# Patient Record
Sex: Male | Born: 1965 | Race: Black or African American | Hispanic: No | Marital: Single | Smoking: Current every day smoker
Health system: Southern US, Community
[De-identification: ages and names within clinical notes are randomized; demographics above are authoritative.]

## PROBLEM LIST (undated history)

## (undated) DIAGNOSIS — R600 Localized edema: Secondary | ICD-10-CM

## (undated) HISTORY — PX: DEBRIDEMENT OF ABDOMINAL WALL ABSCESS: SHX6396

---

## 2013-12-13 ENCOUNTER — Emergency Department: Payer: Self-pay | Admitting: Emergency Medicine

## 2015-11-24 ENCOUNTER — Encounter: Payer: Self-pay | Admitting: Emergency Medicine

## 2015-11-24 ENCOUNTER — Emergency Department: Payer: Self-pay

## 2015-11-24 ENCOUNTER — Emergency Department
Admission: EM | Admit: 2015-11-24 | Discharge: 2015-11-24 | Disposition: A | Discharge status: Home / Self Care | Payer: Self-pay | Attending: Emergency Medicine | Admitting: Emergency Medicine

## 2015-11-24 DIAGNOSIS — Y939 Activity, unspecified: Secondary | ICD-10-CM | POA: Insufficient documentation

## 2015-11-24 DIAGNOSIS — Y999 Unspecified external cause status: Secondary | ICD-10-CM | POA: Insufficient documentation

## 2015-11-24 DIAGNOSIS — W540XXA Bitten by dog, initial encounter: Secondary | ICD-10-CM | POA: Insufficient documentation

## 2015-11-24 DIAGNOSIS — F17210 Nicotine dependence, cigarettes, uncomplicated: Secondary | ICD-10-CM | POA: Insufficient documentation

## 2015-11-24 DIAGNOSIS — S81852A Open bite, left lower leg, initial encounter: Secondary | ICD-10-CM | POA: Insufficient documentation

## 2015-11-24 DIAGNOSIS — Y929 Unspecified place or not applicable: Secondary | ICD-10-CM | POA: Insufficient documentation

## 2015-11-24 LAB — COMPREHENSIVE METABOLIC PANEL
ALBUMIN: 4 g/dL (ref 3.5–5.0)
ALT: 33 U/L (ref 17–63)
ANION GAP: 6 (ref 5–15)
AST: 31 U/L (ref 15–41)
Alkaline Phosphatase: 63 U/L (ref 38–126)
BUN: 18 mg/dL (ref 6–20)
CHLORIDE: 106 mmol/L (ref 101–111)
CO2: 27 mmol/L (ref 22–32)
Calcium: 9.1 mg/dL (ref 8.9–10.3)
Creatinine, Ser: 0.9 mg/dL (ref 0.61–1.24)
GFR calc Af Amer: >60 mL/min (ref >60)
GLUCOSE: 99 mg/dL (ref 65–99)
POTASSIUM: 3.8 mmol/L (ref 3.5–5.1)
Sodium: 139 mmol/L (ref 135–145)
Total Bilirubin: 0.6 mg/dL (ref 0.3–1.2)
Total Protein: 7.6 g/dL (ref 6.5–8.1)

## 2015-11-24 LAB — CBC WITH DIFFERENTIAL/PLATELET
Basophils Absolute: 0 10*3/uL (ref 0–0.1)
Basophils Relative: 1 %
EOS ABS: 0.1 10*3/uL (ref 0–0.7)
EOS PCT: 2 %
HCT: 41.7 % (ref 40.0–52.0)
Hemoglobin: 13.7 g/dL (ref 13.0–18.0)
LYMPHS ABS: 0.9 10*3/uL — AB (ref 1.0–3.6)
LYMPHS PCT: 19 %
MCH: 23.4 pg — ABNORMAL LOW (ref 26.0–34.0)
MCHC: 32.9 g/dL (ref 32.0–36.0)
MCV: 71.3 fL — ABNORMAL LOW (ref 80.0–100.0)
MONO ABS: 0.5 10*3/uL (ref 0.2–1.0)
Monocytes Relative: 11 %
Neutro Abs: 3.2 10*3/uL (ref 1.4–6.5)
Neutrophils Relative %: 67 %
PLATELETS: 135 10*3/uL — AB (ref 150–440)
RBC: 5.85 MIL/uL (ref 4.40–5.90)
RDW: 16.2 % — AB (ref 11.5–14.5)
WBC: 4.7 10*3/uL (ref 3.8–10.6)

## 2015-11-24 MED ORDER — AMOXICILLIN-POT CLAVULANATE 875-125 MG PO TABS: 1.0000 | ORAL_TABLET | Freq: Two times a day (BID) | ORAL | 0 refills | Status: AC

## 2015-11-24 NOTE — ED Provider Notes (Signed)
[facility] Emergency Department Provider Note   ____________________________________________    I have reviewed the triage vital signs and the nursing notes.   HISTORY  Chief Complaint Animal Bite     HPI Alfred [name] is a 50 y.o. male who presents with complaints of bleeding from his ankle.Patient reports he was bit by a dog on his medial distal left lower leg 2 weeks ago. He reports today" the bubble popped" and there was purulent discharge and then a period of bleeding. He reports the bleeding has stopped without intervention. He reports mild pain around the area but no spreading redness. No fevers or chills. He has not taken anything for the dog bite. He was not seen for the dog bite.   History reviewed. No pertinent past medical history.  There are no active problems to display for this patient.   History reviewed. No pertinent surgical history.  Prior to Admission medications   Medication Sig Start Date End Date Taking? Authorizing Provider  amoxicillin-clavulanate (AUGMENTIN) 875-125 MG tablet Take 1 tablet by mouth 2 (two) times daily. 11/24/15 12/01/15  Jene Everyobert Renisha Cockrum, MD     Allergies Review of patient's allergies indicates no known allergies.  History reviewed. No pertinent family history.  Social History Social History  Substance Use Topics  . Smoking status: Current Every Day Smoker    Packs/day: 0.50    Types: Cigarettes  . Smokeless tobacco: Never Used  . Alcohol use Yes     Comment: 3x a week    Review of Systems  Constitutional: No fever/chills   Musculoskeletal: Mild left ankle pain Skin: Negative for rash. Neurological: Negative for r weakness  10-point ROS otherwise negative.  ____________________________________________   PHYSICAL EXAM:  VITAL SIGNS: ED Triage Vitals  Enc Vitals Group     BP 11/24/15 1240 (!) 151/68     Pulse Rate 11/24/15 1240 79     Resp 11/24/15 1240 16     Temp 11/24/15  1240 98.6 F (37 C)     Temp src --      SpO2 11/24/15 1240 96 %     Weight 11/24/15 1241 300 lb (136.1 kg)     Height 11/24/15 1241 6\' 7"  (2.007 m)     Head Circumference --      Peak Flow --      Pain Score 11/24/15 1242 0     Pain Loc --      Pain Edu? --      Excl. in GC? --     Constitutional: Alert and oriented. No acute distress. Pleasant and interactive Eyes: Conjunctivae are normal.  Head: Atraumatic. Nose: No congestion/rhinnorhea. Mouth/Throat: Mucous membranes are moist.    Cardiovascular: Normal rate, regular rhythm.  Respiratory: Normal respiratory effort.  No retractions.   Genitourinary: deferred Musculoskeletal: Warm and well perfused. Just proximal to left medial malleolus there is a ulceration, mild tenderness surrounding but no spreading erythema. No discharge upon palpation. No underlying fluctuance. Neurologic:  Normal speech and language. No gross focal neurologic deficits are appreciated.  Skin:  Skin is warm, dry. Psychiatric: Mood and affect are normal. Speech and behavior are normal.  ____________________________________________   LABS (all labs ordered are listed, but only abnormal results are displayed)  Labs Reviewed  CBC WITH DIFFERENTIAL/PLATELET - Abnormal; Notable for the following:       Result Value   MCV 71.3 (*)    MCH 23.4 (*)    RDW 16.2 (*)  Platelets 135 (*)    Lymphs Abs 0.9 (*)    All other components within normal limits  COMPREHENSIVE METABOLIC PANEL   ____________________________________________  EKG  None ____________________________________________  RADIOLOGY  X-rays unremarkable ____________________________________________   PROCEDURES  Procedure(s) performed: No    Critical Care performed:No ____________________________________________   INITIAL IMPRESSION / ASSESSMENT AND PLAN / ED COURSE  Pertinent labs & imaging results that were available during my care of the patient were reviewed by me and  considered in my medical decision making (see chart for details).  Appears patient developed an abscess from untreated dog bite which ruptured today. No ongoing bleeding, no discharge currently.X-ray and labwork reassuring. Vitals normal. We will start the patient on Augmentin and have him follow-up with PCP  Clinical Course   ____________________________________________   FINAL CLINICAL IMPRESSION(S) / ED DIAGNOSES  Final diagnoses:  Dog bite      NEW MEDICATIONS STARTED DURING THIS VISIT:  New Prescriptions   AMOXICILLIN-CLAVULANATE (AUGMENTIN) 875-125 MG TABLET    Take 1 tablet by mouth 2 (two) times daily.     Note:  This document was prepared using Dragon voice recognition software and may include unintentional dictation errors.    Jene Everyobert Peggyann Zwiefelhofer, MD 11/24/15 1534

## 2015-11-24 NOTE — ED Triage Notes (Signed)
States was bit by his neighbors dog two weeks ago and his work boots are rubbing. Had him take his bloody shoe off. Pt has two areas on his foot. The one he says is from dried skin. The puncture area has the same scaly dried skin around it.

## 2016-10-03 ENCOUNTER — Emergency Department
Admission: EM | Admit: 2016-10-03 | Discharge: 2016-10-04 | Disposition: A | Discharge status: Home / Self Care | Payer: Self-pay | Attending: Emergency Medicine | Admitting: Emergency Medicine

## 2016-10-03 ENCOUNTER — Emergency Department: Payer: Self-pay

## 2016-10-03 DIAGNOSIS — F17210 Nicotine dependence, cigarettes, uncomplicated: Secondary | ICD-10-CM | POA: Insufficient documentation

## 2016-10-03 DIAGNOSIS — W458XXD Other foreign body or object entering through skin, subsequent encounter: Secondary | ICD-10-CM | POA: Insufficient documentation

## 2016-10-03 DIAGNOSIS — Y939 Activity, unspecified: Secondary | ICD-10-CM | POA: Insufficient documentation

## 2016-10-03 DIAGNOSIS — Y999 Unspecified external cause status: Secondary | ICD-10-CM | POA: Insufficient documentation

## 2016-10-03 DIAGNOSIS — Y929 Unspecified place or not applicable: Secondary | ICD-10-CM | POA: Insufficient documentation

## 2016-10-03 DIAGNOSIS — S81809A Unspecified open wound, unspecified lower leg, initial encounter: Secondary | ICD-10-CM | POA: Insufficient documentation

## 2016-10-03 LAB — CBC WITH DIFFERENTIAL/PLATELET
BASOS ABS: 0 10*3/uL (ref 0–0.1)
BASOS PCT: 1 %
Eosinophils Absolute: 0.1 10*3/uL (ref 0–0.7)
Eosinophils Relative: 2 %
HEMATOCRIT: 38 % — AB (ref 40.0–52.0)
HEMOGLOBIN: 12.2 g/dL — AB (ref 13.0–18.0)
LYMPHS PCT: 23 %
Lymphs Abs: 1 10*3/uL (ref 1.0–3.6)
MCH: 23 pg — ABNORMAL LOW (ref 26.0–34.0)
MCHC: 32.2 g/dL (ref 32.0–36.0)
MCV: 71.5 fL — AB (ref 80.0–100.0)
MONO ABS: 0.6 10*3/uL (ref 0.2–1.0)
Monocytes Relative: 14 %
NEUTROS ABS: 2.6 10*3/uL (ref 1.4–6.5)
NEUTROS PCT: 60 %
Platelets: 151 10*3/uL (ref 150–440)
RBC: 5.32 MIL/uL (ref 4.40–5.90)
RDW: 16.1 % — AB (ref 11.5–14.5)
WBC: 4.3 10*3/uL (ref 3.8–10.6)

## 2016-10-03 LAB — COMPREHENSIVE METABOLIC PANEL
ALK PHOS: 58 U/L (ref 38–126)
ALT: 32 U/L (ref 17–63)
ANION GAP: 6 (ref 5–15)
AST: 31 U/L (ref 15–41)
Albumin: 3.7 g/dL (ref 3.5–5.0)
BILIRUBIN TOTAL: 0.7 mg/dL (ref 0.3–1.2)
BUN: 20 mg/dL (ref 6–20)
CALCIUM: 9.1 mg/dL (ref 8.9–10.3)
CO2: 27 mmol/L (ref 22–32)
Chloride: 102 mmol/L (ref 101–111)
Creatinine, Ser: 0.77 mg/dL (ref 0.61–1.24)
GFR calc non Af Amer: >60 mL/min (ref >60)
GLUCOSE: 93 mg/dL (ref 65–99)
POTASSIUM: 4.2 mmol/L (ref 3.5–5.1)
Sodium: 135 mmol/L (ref 135–145)
TOTAL PROTEIN: 7.4 g/dL (ref 6.5–8.1)

## 2016-10-03 NOTE — ED Triage Notes (Signed)
Pt states has had a left ankle wound for over one year that will not heal. Pt with open wound with yellow drainage noted to left medial ankle. Pt denies fever. Pt states "i guess it won't heal because of my drinking". Pt is ambulatory with pain to ankle. Pt denies history of DM or HTN.

## 2016-10-04 NOTE — ED Notes (Signed)
Patient presents with complaint of wound to left ankle for approximately 1 year.  Patient reports occurred with a rusty nail.  Patient states area has never healed and occasionally has yellowish color drainage.  States was seen in ED once after originally occurred but none since then.  Area noted to left lateral ankle with open area with pink colored skin noted in opening, no exudate noted.

## 2016-10-04 NOTE — ED Provider Notes (Signed)
[facility] Emergency Department Provider Note  ____________________________________________   First MD Initiated Contact with Patient 10/04/16 0013     (approximate)  I have reviewed the triage vital signs and the nursing notes.   HISTORY  Chief Complaint Wound Infection    HPI Dayvin Junior Navarro is a 51 y.o. male who reports a history of regular alcohol use and tobacco use who presents for evaluation of a nonhealing wound to his left inner ankle.  He reports that he got the wound 10 months ago and came to the emergency department and was treated with antibiotics.  He states that it was due to a "rusty nail" but a review of the medical record demonstrates that he told the doctor at the time that he was bitten by a dog.  He was treated with Augmentin and a Tdap and he has not followed up with anyone.  When his brother came to visit he saw the ankle and insisted that he come to the emergency department.  He states that he has moderate pain with ambulation and weightbearing but that has been present for months.  Does not seem to be getting any worse but it also does not seem to be getting any better.  It occasionally uses some yellowish liquid.  He denies fever/chills, chest pain, shortness of breath, nausea, vomiting, abdominal pain.  He describes it as severe but constant for months.     No past medical history on file.  There are no active problems to display for this patient.   No past surgical history on file.  Prior to Admission medications   Not on File    Allergies Patient has no known allergies.  No family history on file.  Social History Social History  Substance Use Topics  . Smoking status: Current Every Day Smoker    Packs/day: 0.50    Types: Cigarettes  . Smokeless tobacco: Never Used  . Alcohol use Yes     Comment: 3x a week    Review of Systems Constitutional: No fever/chills Eyes: No visual changes. ENT: No sore  throat. Cardiovascular: Denies chest pain. Respiratory: Denies shortness of breath. Gastrointestinal: No abdominal pain.  No nausea, no vomiting.  No diarrhea.  No constipation. Genitourinary: Negative for dysuria. Musculoskeletal: Negative for neck pain.  Negative for back pain. Integumentary: Nonhealing wound to left inner ankle or 10 months Neurological: Negative for headaches, focal weakness or numbness.   ____________________________________________   PHYSICAL EXAM:  VITAL SIGNS: ED Triage Vitals [10/03/16 1955]  Enc Vitals Group     BP (!) 144/98     Pulse Rate 90     Resp 16     Temp 98.3 F (36.8 C)     Temp Source Oral     SpO2 100 %     Weight 136.1 kg (300 lb)     Height 2.007 m (6\' 7" )     Head Circumference      Peak Flow      Pain Score      Pain Loc      Pain Edu?      Excl. in GC?     Constitutional: Alert and oriented. Well appearing and in no acute distress. Eyes: Conjunctivae are normal.  Cardiovascular: Normal rate, regular rhythm. Good peripheral circulation. Grossly normal heart sounds. Respiratory: Normal respiratory effort.  No retractions. Lungs CTAB. Gastrointestinal: Soft and nontender. No distention.  Musculoskeletal: No lower extremity tenderness nor edema. No gross deformities of extremities.  Neurologic:  Normal speech and language. No gross focal neurologic deficits are appreciated.  Skin:  Multi-centimeters open wound with granulation tissue and thickened surrounding skin.  No surrounding erythema or evidence of cellulitis.  Soft and easily palpable compartments.  Minimal tenderness to palpation.  No discharge/purulence.  Took photo and placed directly into [facility] (look under Media tab of Chart Review). Psychiatric: Mood and affect are normal. Speech and behavior are normal.  ____________________________________________   LABS (all labs ordered are listed, but only abnormal results are displayed)  Labs Reviewed  CBC WITH  DIFFERENTIAL/PLATELET - Abnormal; Notable for the following:       Result Value   Hemoglobin 12.2 (*)    HCT 38.0 (*)    MCV 71.5 (*)    MCH 23.0 (*)    RDW 16.1 (*)    All other components within normal limits  COMPREHENSIVE METABOLIC PANEL   ____________________________________________  EKG  None - EKG not ordered by ED physician ____________________________________________  RADIOLOGY   Dg Ankle Complete Left  Result Date: 10/03/2016 CLINICAL DATA:  Medial left ankle wound for 1 year. EXAM: LEFT ANKLE COMPLETE - 3+ VIEW COMPARISON:  None. FINDINGS: The skin defect medially is consistent with history. No underlying bony erosion or evidence of osteomyelitis. No fracture dislocation. No other acute abnormalities. IMPRESSION: No evidence of osteomyelitis. Electronically Signed   By: Gerome Samavid  Williams III M.D   On: 10/03/2016 20:36    ____________________________________________   PROCEDURES  Critical Care performed: No   Procedure(s) performed:   Procedures   ____________________________________________   INITIAL IMPRESSION / ASSESSMENT AND PLAN / ED COURSE  Pertinent labs & imaging results that were available during my care of the patient were reviewed by me and considered in my medical decision making (see chart for details).  The patient is well-appearing and in no acute distress, afebrile, not tachycardic, normal labs with no leukocytosis, and no evidence of osteomyelitis on radiographs.  I explained that there is no evidence that he has an acute infection, counseled him to cut back on the smoking and drinking, and encouraged him to follow up with wound care center.  I took a [facility] which is available in the Media tab of Chart Review.  I gave some general wound care instructions and ordered a wound care consult to help follow-up as an outpatient.  I gave my usual and customary return precautions.  He is comfortable with the plan.       ____________________________________________  FINAL CLINICAL IMPRESSION(S) / ED DIAGNOSES  Final diagnoses:  Non-healing wound of lower extremity, initial encounter     MEDICATIONS GIVEN DURING THIS VISIT:  Medications - No data to display   NEW OUTPATIENT MEDICATIONS STARTED DURING THIS VISIT:  New Prescriptions   No medications on file    Modified Medications   No medications on file    Discontinued Medications   No medications on file     Note:  This document was prepared using Dragon voice recognition software and may include unintentional dictation errors.    Loleta RoseForbach, Izzac Rockett, MD 10/04/16 [phone]

## 2016-10-04 NOTE — Discharge Instructions (Signed)
As we discussed, although your wound has been open for about 10 months, you have no sign of active infection, with normal labs and normal x-rays with no sign of bone infection.  We do not feel you would benefit from antibiotics at this time but we do recommend that you follow up with the wound care center for further management recommendations.  Please remember that regular alcohol use and especially tobacco use can delay wound healing, so try to minimize both drinking and smoking as much as possible.  Keep the wound clean and dry to the best of your ability.

## 2016-10-30 ENCOUNTER — Encounter: Payer: Self-pay | Attending: Surgery | Admitting: Surgery

## 2016-10-30 DIAGNOSIS — F17218 Nicotine dependence, cigarettes, with other nicotine-induced disorders: Secondary | ICD-10-CM | POA: Insufficient documentation

## 2016-10-30 DIAGNOSIS — L97322 Non-pressure chronic ulcer of left ankle with fat layer exposed: Secondary | ICD-10-CM | POA: Insufficient documentation

## 2016-10-30 DIAGNOSIS — I739 Peripheral vascular disease, unspecified: Secondary | ICD-10-CM | POA: Insufficient documentation

## 2016-11-01 NOTE — Progress Notes (Signed)
VERTIS, SCHEIB ([id number]) Visit Report for 10/30/2016 Allergy List Details Patient Name: Alfred [name], Alfred [name]. Date of Service: 10/30/2016 8:00 AM Medical Record Number: [id number] Patient Account Number: [id number] Date of Birth/Sex: June 27, 1965 (51 y.o. Male) Treating RN: Curtis Sites Primary Care Amad Mau: PATIENT, NO Other Clinician: Referring Alicia Seib: Loleta Rose Treating Dorine Duffey/Extender: Rudene Re in Treatment: 0 Allergies Active Allergies No Known Allergies Allergy Notes Electronic Signature(s) Signed: 10/30/2016 4:26:29 PM By: Curtis Sites Entered By: Curtis Sites on 10/30/2016 08:21:28 Alfred Han ([id number]) -------------------------------------------------------------------------------- Arrival Information Details Patient Name: Alfred [name], Alfred [name]. Date of Service: 10/30/2016 8:00 AM Medical Record Number: [id number] Patient Account Number: [id number] Date of Birth/Sex: [birth year] (51 y.o. Male) Treating RN: Curtis Sites Primary Care Roshawna Colclasure: PATIENT, NO Other Clinician: Referring Shadi Sessler: Loleta Rose Treating Sarely Stracener/Extender: Rudene Re in Treatment: 0 Visit Information Patient Arrived: Ambulatory Arrival Time: 08:17 Accompanied By: self Transfer Assistance: None Patient Identification Verified: Yes Secondary Verification Process Yes Completed: Patient Has Alerts: Yes Patient Alerts: ABI [place] >220 Electronic Signature(s) Signed: 10/30/2016 4:26:29 PM By: Curtis Sites Entered By: Curtis Sites on 10/30/2016 08:53:40 Alfred Han ([id number]) -------------------------------------------------------------------------------- Clinic Level of Care Assessment Details Patient Name: Alfred [name], Alfred [name]. Date of Service: 10/30/2016 8:00 AM Medical Record Number: [id number] Patient Account Number: [id number] Date of Birth/Sex: 1965-04-25 (51 y.o. Male) Treating RN: Curtis Sites Primary Care Wyllow Seigler: PATIENT, NO Other  Clinician: Referring Luzmaria Devaux: Loleta Rose Treating Ewald Beg/Extender: Rudene Re in Treatment: 0 Clinic Level of Care Assessment Items TOOL 1 Quantity Score []  - Use when EandM and Procedure is performed on INITIAL visit 0 ASSESSMENTS - Nursing Assessment / Reassessment X - General Physical Exam (combine w/ comprehensive assessment (listed just 1 20 below) when performed on new pt. evals) X - Comprehensive Assessment (HX, ROS, Risk Assessments, Wounds Hx, etc.) 1 25 ASSESSMENTS - Wound and Skin Assessment / Reassessment []  - Dermatologic / Skin Assessment (not related to wound area) 0 ASSESSMENTS - Ostomy and/or Continence Assessment and Care []  - Incontinence Assessment and Management 0 []  - Ostomy Care Assessment and Management (repouching, etc.) 0 PROCESS - Coordination of Care X - Simple Patient / Family Education for ongoing care 1 15 []  - Complex (extensive) Patient / Family Education for ongoing care 0 X - Staff obtains Chiropractor, Records, Test Results / Process Orders 1 10 []  - Staff telephones HHA, Nursing Homes / Clarify orders / etc 0 []  - Routine Transfer to another Facility (non-emergent condition) 0 []  - Routine Hospital Admission (non-emergent condition) 0 X - New Admissions / Manufacturing engineer / Ordering NPWT, Apligraf, etc. 1 15 []  - Emergency Hospital Admission (emergent condition) 0 PROCESS - Special Needs []  - Pediatric / Minor Patient Management 0 []  - Isolation Patient Management 0 VIRAAT, VANPATTEN ([id number]) []  - Hearing / Language / Visual special needs 0 []  - Assessment of Community assistance (transportation, D/C planning, etc.) 0 []  - Additional assistance / Altered mentation 0 []  - Support Surface(s) Assessment (bed, cushion, seat, etc.) 0 INTERVENTIONS - Miscellaneous []  - External ear exam 0 []  - Patient Transfer (multiple staff / Nurse, adult / Similar devices) 0 []  - Simple Staple / Suture removal (25 or less) 0 []  - Complex  Staple / Suture removal (26 or more) 0 []  - Hypo/Hyperglycemic Management (do not check if billed separately) 0 X - Ankle / Brachial Index (ABI) - do not check if billed separately 1 15 Has the patient been seen at the hospital within the last three years: Yes Total  Score: 100 Level Of Care: New/Established - Level 3 Electronic Signature(s) Signed: 10/30/2016 4:26:29 PM By: Curtis Sites Entered By: Curtis Sites on 10/30/2016 09:27:42 Alfred Han ([id number]) -------------------------------------------------------------------------------- Encounter Discharge Information Details Patient Name: Alfred [name], Alfred [name]. Date of Service: 10/30/2016 8:00 AM Medical Record Number: [id number] Patient Account Number: [id number] Date of Birth/Sex: December 22, 1965 (51 y.o. Male) Treating RN: Curtis Sites Primary Care Caige Almeda: PATIENT, NO Other Clinician: Referring Kiriana Worthington: Loleta Rose Treating Almira Phetteplace/Extender: Rudene Re in Treatment: 0 Encounter Discharge Information Items Discharge Pain Level: 0 Discharge Condition: Stable Ambulatory Status: Ambulatory Discharge Destination: Home Transportation: Private Auto Accompanied By: self Schedule Follow-up Appointment: Yes Medication Reconciliation completed and provided to Patient/Care No Izzie Geers: Provided on Clinical Summary of Care: 10/30/2016 Form Type Recipient Paper Patient TR Electronic Signature(s) Signed: 10/30/2016 9:26:34 AM By: Curtis Sites Previous Signature: 10/30/2016 9:14:58 AM Version By: Gwenlyn Perking Entered By: Curtis Sites on 10/30/2016 09:26:34 Alfred Han ([id number]) -------------------------------------------------------------------------------- Lower Extremity Assessment Details Patient Name: Alfred [name], Alfred [name]. Date of Service: 10/30/2016 8:00 AM Medical Record Number: [id number] Patient Account Number: [id number] Date of Birth/Sex: 03-Feb-1966 (51 y.o. Male) Treating RN: Curtis Sites Primary  Care Emori Mumme: PATIENT, NO Other Clinician: Referring Halen Mossbarger: Loleta Rose Treating Isabelle Matt/Extender: Rudene Re in Treatment: 0 Edema Assessment Assessed: [Left: No] [Right: No] Edema: [Left: Ye] [Right: s] Vascular Assessment Pulses: Dorsalis Pedis Palpable: [Left:Yes] Doppler Audible: [Left:Yes] Posterior Tibial Palpable: [Left:Yes] Doppler Audible: [Left:Yes] Extremity colors, hair growth, and conditions: Extremity Color: [Left:Normal] Hair Growth on Extremity: [Left:No] Temperature of Extremity: [Left:Warm] Capillary Refill: [Left:< 3 seconds] Toe Nail Assessment Left: Right: Thick: Yes Discolored: Yes Deformed: No Improper Length and Hygiene: No Notes ABI [place] >220 Electronic Signature(s) Signed: 10/30/2016 4:26:29 PM By: Curtis Sites Entered By: Curtis Sites on 10/30/2016 08:39:39 Alfred Han ([id number]) -------------------------------------------------------------------------------- Multi Wound Chart Details Patient Name: Alfred [name], Alfred [name]. Date of Service: 10/30/2016 8:00 AM Medical Record Number: [id number] Patient Account Number: [id number] Date of Birth/Sex: 13-May-1965 (51 y.o. Male) Treating RN: Curtis Sites Primary Care Lorry Furber: PATIENT, NO Other Clinician: Referring Jovaughn Wojtaszek: Loleta Rose Treating Dyesha Henault/Extender: Rudene Re in Treatment: 0 Vital Signs Height(in): 79 Pulse(bpm): 76 Weight(lbs): 285 Blood Pressure 153/74 (mmHg): Body Mass Index(BMI): 32 Temperature(F): 97.9 Respiratory Rate 18 (breaths/min): Photos: [1:No Photos] [N/A:N/A] Wound Location: [1:Left Malleolus - Medial] [N/A:N/A] Wounding Event: [1:Not Known] [N/A:N/A] Primary Etiology: [1:To be determined] [N/A:N/A] Date Acquired: [1:11/25/2015] [N/A:N/A] Weeks of Treatment: [1:0] [N/A:N/A] Wound Status: [1:Open] [N/A:N/A] Measurements L x W x D 2.5x1.7x0.2 [N/A:N/A] (cm) Area (cm) : [1:3.338] [N/A:N/A] Volume (cm) : [1:0.668]  [N/A:N/A] Classification: [1:Full Thickness Without Exposed Support Structures] [N/A:N/A] Exudate Amount: [1:Large] [N/A:N/A] Exudate Type: [1:Serous] [N/A:N/A] Exudate Color: [1:amber] [N/A:N/A] Foul Odor After [1:Yes] [N/A:N/A] Cleansing: Odor Anticipated Due to No [N/A:N/A] Product Use: Wound Margin: [1:Flat and Intact] [N/A:N/A] Granulation Amount: [1:Large (67-100%)] [N/A:N/A] Granulation Quality: [1:Red] [N/A:N/A] Necrotic Amount: [1:Small (1-33%)] [N/A:N/A] Exposed Structures: [1:Fascia: No Fat Layer (Subcutaneous Tissue) Exposed: No Tendon: No Muscle: No] [N/A:N/A] Joint: No Bone: No Epithelialization: None N/A N/A Debridement: Debridement (74259- N/A N/A 11047) Pre-procedure 08:56 N/A N/A Verification/Time Out Taken: Pain Control: Lidocaine 4% Topical N/A N/A Solution Tissue Debrided: Necrotic/Eschar, N/A N/A Fibrin/Slough, Skin, Subcutaneous Level: Skin/Subcutaneous N/A N/A Tissue Debridement Area (sq 4.25 N/A N/A cm): Instrument: Curette N/A N/A Bleeding: Minimum N/A N/A Hemostasis Achieved: Pressure N/A N/A Procedural Pain: 0 N/A N/A Post Procedural Pain: 0 N/A N/A Debridement Treatment Procedure was tolerated N/A N/A Response: well Post Debridement 2.7x1.7x0.2 N/A N/A Measurements L x W x  D (cm) Post Debridement 0.721 N/A N/A Volume: (cm) Periwound Skin Texture: Excoriation: No N/A N/A Induration: No Callus: No Crepitus: No Rash: No Scarring: No Periwound Skin Maceration: No N/A N/A Moisture: Dry/Scaly: No Periwound Skin Color: Atrophie Blanche: No N/A N/A Cyanosis: No Ecchymosis: No Erythema: No Hemosiderin Staining: No Mottled: No Pallor: No Rubor: No Temperature: No Abnormality N/A N/A Tenderness on Yes N/A N/A Palpation: Wound Preparation: Ulcer Cleansing: N/A N/A Rinsed/Irrigated with Alfred [name], Alfred J. ([id number]) Saline, Other: soap and water Topical Anesthetic Applied: Other: lidocaine 4% Procedures Performed: Debridement  N/A N/A Treatment Notes Electronic Signature(s) Signed: 10/30/2016 9:10:55 AM By: Evlyn KannerBritto, Errol MD, FACS Entered By: Evlyn KannerBritto, Errol on 10/30/2016 09:10:55 Alfred [name], Zayvier J. ([id number]) -------------------------------------------------------------------------------- Multi-Disciplinary Care Plan Details Patient Name: Alfred [name], Alfred J. Date of Service: 10/30/2016 8:00 AM Medical Record Number: [id number] Patient Account Number: [id number] Date of Birth/Sex: 01/29/1966 (51 y.o. Male) Treating RN: Curtis Sitesorthy, Joanna Primary Care Genae Strine: PATIENT, NO Other Clinician: Referring Latrell Potempa: Loleta RoseFORBACH, CORY Treating Richardine Peppers/Extender: Rudene ReBritto, Errol Weeks in Treatment: 0 Active Inactive ` Orientation to the Wound Care Program Nursing Diagnoses: Knowledge deficit related to the wound healing center program Goals: Patient/caregiver will verbalize understanding of the Wound Healing Center Program Date Initiated: 10/30/2016 Target Resolution Date: 12/04/2016 Goal Status: Active Interventions: Provide education on orientation to the wound center Notes: ` Wound/Skin Impairment Nursing Diagnoses: Impaired tissue integrity Goals: Ulcer/skin breakdown will have a volume reduction of 30% by week 4 Date Initiated: 10/30/2016 Target Resolution Date: 12/04/2016 Goal Status: Active Ulcer/skin breakdown will have a volume reduction of 50% by week 8 Date Initiated: 10/30/2016 Target Resolution Date: 12/04/2016 Goal Status: Active Ulcer/skin breakdown will have a volume reduction of 80% by week 12 Date Initiated: 10/30/2016 Target Resolution Date: 12/04/2016 Goal Status: Active Ulcer/skin breakdown will heal within 14 weeks Date Initiated: 10/30/2016 Target Resolution Date: 12/04/2016 Goal Status: Active Interventions: Alfred [name], Alfred J. ([id number]) Assess patient/caregiver ability to obtain necessary supplies Assess patient/caregiver ability to perform ulcer/skin care regimen upon admission and as  needed Assess ulceration(s) every visit Notes: Electronic Signature(s) Signed: 10/30/2016 4:26:29 PM By: Curtis Sitesorthy, Joanna Entered By: Curtis Sitesorthy, Joanna on 10/30/2016 08:55:01 Alfred [name], Esa J. ([id number]) -------------------------------------------------------------------------------- Pain Assessment Details Patient Name: Alfred [name], Alfred J. Date of Service: 10/30/2016 8:00 AM Medical Record Number: [id number] Patient Account Number: [id number] Date of Birth/Sex: 01/29/1966 (51 y.o. Male) Treating RN: Curtis Sitesorthy, Joanna Primary Care Chrissy Ealey: PATIENT, NO Other Clinician: Referring Erin Uecker: Loleta RoseFORBACH, CORY Treating Brinsley Wence/Extender: Rudene ReBritto, Errol Weeks in Treatment: 0 Active Problems Location of Pain Severity and Description of Pain Patient Has Paino Yes Site Locations Pain Location: Pain in Ulcers With Dressing Change: Yes Duration of the Pain. Constant / Intermittento Constant Pain Management and Medication Current Pain Management: Notes Topical or injectable lidocaine is offered to patient for acute pain when surgical debridement is performed. If needed, Patient is instructed to use over the counter pain medication for the following 24-48 hours after debridement. Wound care MDs do not prescribed pain medications. Patient has chronic pain or uncontrolled pain. Patient has been instructed to make an appointment with their Primary Care Physician for pain management. Electronic Signature(s) Signed: 10/30/2016 4:26:29 PM By: Curtis Sitesorthy, Joanna Entered By: Curtis Sitesorthy, Joanna on 10/30/2016 08:18:30 Alfred [name], Nikos J. ([id number]) -------------------------------------------------------------------------------- Patient/Caregiver Education Details Patient Name: Alfred [name], Michiah J. Date of Service: 10/30/2016 8:00 AM Medical Record Number: [id number] Patient Account Number: [id number] Date of Birth/Gender: 01/29/1966 (51 y.o. Male) Treating RN: Curtis Sitesorthy, Joanna Primary Care Physician: PATIENT, NO Other  Clinician: Referring Physician: Loleta RoseFORBACH, CORY Treating Physician/Extender: Evlyn KannerBritto, Errol  Weeks in Treatment: 0 Education Assessment Education Provided To: Patient Education Topics Provided Smoking and Wound Healing: Handouts: Smoking and Wound Healing Methods: Explain/Verbal Responses: State content correctly Venous: Handouts: Other: leg elevation Methods: Explain/Verbal Responses: State content correctly Wound/Skin Impairment: Handouts: Other: wound care as ordered Methods: Demonstration, Explain/Verbal Responses: State content correctly Electronic Signature(s) Signed: 10/30/2016 4:26:29 PM By: Curtis Sitesorthy, Joanna Entered By: Curtis Sitesorthy, Joanna on 10/30/2016 09:27:12 Alfred [name], Alfred J. ([id number]) -------------------------------------------------------------------------------- Wound Assessment Details Patient Name: Alfred [name], Harol J. Date of Service: 10/30/2016 8:00 AM Medical Record Number: [id number] Patient Account Number: [id number] Date of Birth/Sex: [birth year] (51 y.o. Male) Treating RN: Curtis Sitesorthy, Joanna Primary Care Shukri Nistler: PATIENT, NO Other Clinician: Referring Thayden Lemire: Loleta RoseFORBACH, CORY Treating Casmir Auguste/Extender: Rudene ReBritto, Errol Weeks in Treatment: 0 Wound Status Wound Number: 1 Primary Etiology: To be determined Wound Location: Left Malleolus - Medial Wound Status: Open Wounding Event: Not Known Date Acquired: 11/25/2015 Weeks Of Treatment: 0 Clustered Wound: No Photos Photo Uploaded By: Curtis Sitesorthy, Joanna on 10/30/2016 11:26:23 Wound Measurements Length: (cm) 2.5 Width: (cm) 1.7 Depth: (cm) 0.2 Area: (cm) 3.338 Volume: (cm) 0.668 % Reduction in Area: % Reduction in Volume: Epithelialization: None Tunneling: No Undermining: No Wound Description Full Thickness Without Exposed Classification: Support Structures Wound Margin: Flat and Intact Exudate Large Amount: Exudate Type: Serous Exudate Color: amber Foul Odor After Cleansing: Yes Due to Product Use:  No Slough/Fibrino Yes Wound Bed Granulation Amount: Large (67-100%) Exposed Structure Granulation Quality: Red Fascia Exposed: No Necrotic Amount: Small (1-33%) Fat Layer (Subcutaneous Tissue) Exposed: No Alfred [name], Darry J. ([id number]) Necrotic Quality: Adherent Slough Tendon Exposed: No Muscle Exposed: No Joint Exposed: No Bone Exposed: No Periwound Skin Texture Texture Color No Abnormalities Noted: No No Abnormalities Noted: No Callus: No Atrophie Blanche: No Crepitus: No Cyanosis: No Excoriation: No Ecchymosis: No Induration: No Erythema: No Rash: No Hemosiderin Staining: No Scarring: No Mottled: No Pallor: No Moisture Rubor: No No Abnormalities Noted: No Dry / Scaly: No Temperature / Pain Maceration: No Temperature: No Abnormality Tenderness on Palpation: Yes Wound Preparation Ulcer Cleansing: Rinsed/Irrigated with Saline, Other: soap and water, Topical Anesthetic Applied: Other: lidocaine 4%, Treatment Notes Wound #1 (Left, Medial Malleolus) 1. Cleansed with: Clean wound with Normal Saline Cleanse wound with antibacterial soap and water 2. Anesthetic Topical Lidocaine 4% cream to wound bed prior to debridement 4. Dressing Applied: Aquacel Ag 5. Secondary Dressing Applied Guaze, ABD and kerlix/Conform 7. Secured with Secretary/administratorTape Electronic Signature(s) Signed: 10/30/2016 4:26:29 PM By: Curtis Sitesorthy, Joanna Entered By: Curtis Sitesorthy, Joanna on 10/30/2016 08:35:43 Alfred [name], Sostenes J. ([id number]) -------------------------------------------------------------------------------- Vitals Details Patient Name: Alfred [name], Eamonn J. Date of Service: 10/30/2016 8:00 AM Medical Record Number: [id number] Patient Account Number: [id number] Date of Birth/Sex: [birth year] (51 y.o. Male) Treating RN: Curtis Sitesorthy, Joanna Primary Care Lalla Laham: PATIENT, NO Other Clinician: Referring Nishita Isaacks: Loleta RoseFORBACH, CORY Treating Marynell Bies/Extender: Rudene ReBritto, Errol Weeks in Treatment: 0 Vital Signs Time Taken:  08:18 Temperature (F): 97.9 Height (in): 79 Pulse (bpm): 76 Source: Measured Respiratory Rate (breaths/min): 18 Weight (lbs): 285 Blood Pressure (mmHg): 153/74 Source: Measured Reference Range: 80 - 120 mg / dl Body Mass Index (BMI): 32.1 Electronic Signature(s) Signed: 10/30/2016 4:26:29 PM By: Curtis Sitesorthy, Joanna Entered By: Curtis Sitesorthy, Joanna on 10/30/2016 08:20:57

## 2016-11-02 NOTE — Progress Notes (Signed)
DEVERICK, PRUSS ([id number]) Visit Report for 10/30/2016 Chief Complaint Document Details Patient Name: Alfred [name], Alfred [name]. Date of Service: 10/30/2016 8:00 AM Medical Record Number: [id number] Patient Account Number: [id number] Date of Birth/Sex: 1965/04/28 (51 y.o. Male) Treating RN: Alfred [name] Primary Care Provider: PATIENT, NO Other Clinician: Referring Provider: Loleta [name] Treating Provider/Extender: Alfred [name] in Treatment: 0 Information Obtained from: Patient Chief Complaint Patient seen for complaints of Non-Healing Wound to the left medial ankle which she's had for 11 months Electronic Signature(s) Signed: 10/30/2016 9:11:23 AM By: Alfred Kanner MD, FACS Entered By: Alfred [name] on 10/30/2016 09:11:22 Alfred [name], Alfred [name] ([id number]) -------------------------------------------------------------------------------- Debridement Details Patient Name: Alfred [name]. Date of Service: 10/30/2016 8:00 AM Medical Record Number: [id number] Patient Account Number: [id number] Date of Birth/Sex: 10-21-1965 (51 y.o. Male) Treating RN: Alfred [name] Primary Care Provider: PATIENT, NO Other Clinician: Referring Provider: Loleta [name] Treating Provider/Extender: Alfred [name] in Treatment: 0 Debridement Performed for Wound #1 Left,Medial Malleolus Assessment: Performed By: Physician Alfred Kanner, MD Debridement: Debridement Pre-procedure Verification/Time Out Yes - 08:56 Taken: Start Time: 08:56 Pain Control: Lidocaine 4% Topical Solution Level: Skin/Subcutaneous Tissue Total Area Debrided (Alfred [name] x 2.5 (cm) x 1.7 (cm) = 4.25 (cm) W): Tissue and other Viable, Non-Viable, Eschar, Fibrin/Slough, Skin, Subcutaneous material debrided: Instrument: Curette Bleeding: Minimum Hemostasis Achieved: Pressure End Time: 09:01 Procedural Pain: 0 Post Procedural Pain: 0 Response to Treatment: Procedure was tolerated well Post Debridement Measurements of Total  Wound Length: (cm) 2.7 Width: (cm) 1.7 Depth: (cm) 0.2 Volume: (cm) 0.721 Character of Wound/Ulcer Post Improved Debridement: Post Procedure Diagnosis Same as Pre-procedure Electronic Signature(s) Signed: 10/30/2016 9:11:04 AM By: Alfred Kanner MD, FACS Signed: 10/30/2016 4:26:29 PM By: Alfred [name] Entered By: Alfred [name] on 10/30/2016 09:11:03 Alfred [name] ([id number]) -------------------------------------------------------------------------------- HPI Details Patient Name: Alfred [name], Alfred [name]. Date of Service: 10/30/2016 8:00 AM Medical Record Number: [id number] Patient Account Number: [id number] Date of Birth/Sex: 07-07-1965 (51 y.o. Male) Treating RN: Alfred [name] Primary Care Provider: PATIENT, NO Other Clinician: Referring Provider: Loleta [name] Treating Provider/Extender: Alfred [name] in Treatment: 0 History of Present Illness Location: Patient presents with an ulcer on the left medial ankle Quality: Patient reports experiencing a sharp pain to affected area(s). Severity: Patient states wound are getting worse. Duration: Patient has had the wound for > 11 months prior to seeking treatment at the wound center Timing: Pain in wound is Intermittent (comes and goes Context: The wound would happen gradually Modifying Factors: Patient must stand for long periods while working Associated Signs and Symptoms: Patient reports having increase swelling. HPI Description: 51 year old man presented to the ER in early January with a nonhealing wound to the left inner ankle which he has had for about 10 months. he was treated with antibiotics a while ago because it was due to a rusty nail but the electronic medical records demonstrated that he had told the doctors it was a dog bite. As per the notes he was given Augmentin and tetanus toxoid. He drinks alcohol and smokes half pack of cigarettes every day. in the ER an x-ray of the ankle showed no evidence of osteomyelitis.  Lab work done in the ER did not show any evidence of diabetes mellitus Electronic Signature(s) Signed: 10/30/2016 9:12:11 AM By: Alfred Kanner MD, FACS Previous Signature: 10/30/2016 8:42:10 AM Version By: Alfred Kanner MD, FACS Previous Signature: 10/30/2016 8:35:10 AM Version By: Alfred Kanner MD, FACS Entered By: Alfred [name] on 10/30/2016 09:12:10 Alfred [name] ([id number]) -------------------------------------------------------------------------------- Physical Exam Details Patient Name:  Alfred [name], Alfred J. Date of Service: 10/30/2016 8:00 AM Medical Record Number: [id number] Patient Account Number: [id number] Date of Birth/Sex: 23-Apr-1965 (51 y.o. Male) Treating RN: Alfred [name] Primary Care Provider: PATIENT, NO Other Clinician: Referring Provider: Loleta [name] Treating Provider/Extender: Alfred [name] in Treatment: 0 Constitutional . Pulse regular. Respirations normal and unlabored. Afebrile. . Eyes Nonicteric. Reactive to light. Ears, Nose, Mouth, and Throat Lips, teeth, and gums WNL.Marland Kitchen Moist mucosa without lesions. Neck supple and nontender. No palpable supraclavicular or cervical adenopathy. Normal sized without goiter. Respiratory WNL. No retractions.. Breath sounds WNL, No rubs, rales, rhonchi, or wheeze.. Cardiovascular Heart rhythm and rate regular, no murmur or gallop.. Pedal Pulses WNL. ABIs were noncompressible. he has stigmata of venous hypertension left lower extremity.. Chest Breasts symmetical and no nipple discharge.. Breast tissue WNL, no masses, lumps, or tenderness.. Gastrointestinal (GI) Abdomen without masses or tenderness.. No liver or spleen enlargement or tenderness.. Lymphatic No adneopathy. No adenopathy. No adenopathy. Musculoskeletal Adexa without tenderness or enlargement.. Digits and nails w/o clubbing, cyanosis, infection, petechiae, ischemia, or inflammatory conditions.. Integumentary (Hair, Skin) No suspicious lesions. No crepitus  or fluctuance. No peri-wound warmth or erythema. No masses.Marland Kitchen Psychiatric Judgement and insight Intact.. No evidence of depression, anxiety, or agitation.. Notes the patient has a large wound on the left medial ankle with a lot of encrustation and debris at the base of the ulcer and surrounding it and the edges are heaped. Sharp debridement was done with a #3 curet and all the eschar exudate and edges were trimmed and the base of the ulcer was curetted down to healthy granulation tissue. Minimal bleeding controlled with pressure Electronic Signature(s) Signed: 10/30/2016 9:13:20 AM By: Alfred Kanner MD, FACS JYMIR, DUNAJ ([id number]) Entered By: Alfred [name] on 10/30/2016 09:13:19 RED, MANDT ([id number]) -------------------------------------------------------------------------------- Physician Orders Details Patient Name: Alfred [name], Alfred [name]. Date of Service: 10/30/2016 8:00 AM Medical Record Number: [id number] Patient Account Number: [id number] Date of Birth/Sex: 30-Jul-1965 (51 y.o. Male) Treating RN: Alfred [name] Primary Care Provider: PATIENT, NO Other Clinician: Referring Provider: Loleta [name] Treating Provider/Extender: Alfred [name] in Treatment: 0 Verbal / Phone Orders: No Diagnosis Coding Wound Cleansing Wound #1 Left,Medial Malleolus o Clean wound with Normal Saline. o May Shower, gently pat wound dry prior to applying new dressing. Anesthetic Wound #1 Left,Medial Malleolus o Topical Lidocaine 4% cream applied to wound bed prior to debridement Primary Wound Dressing Wound #1 Left,Medial Malleolus o Aquacel Ag Secondary Dressing Wound #1 Left,Medial Malleolus o Gauze, ABD and Kerlix/Conform Dressing Change Frequency Wound #1 Left,Medial Malleolus o Change dressing every day. Follow-up Appointments Wound #1 Left,Medial Malleolus o Return Appointment in 1 week. Edema Control Wound #1 Left,Medial Malleolus o Elevate legs to the level  of the heart and pump ankles as often as possible Additional Orders / Instructions Wound #1 Left,Medial Malleolus o Stop Smoking o Increase protein intake. o Other: - Please add vitamin A, vitamin C and zinc supplements to your diet MAHAMUD, METTS ([id number]) Services and Therapies o Arterial Studies- Bilateral o Venous Studies -Bilateral Electronic Signature(s) Signed: 10/30/2016 3:58:43 PM By: Alfred Kanner MD, FACS Signed: 10/30/2016 4:26:29 PM By: Alfred [name] Entered By: Alfred [name] on 10/30/2016 09:04:15 VALENTINE, BARNEY ([id number]) -------------------------------------------------------------------------------- Problem List Details Patient Name: Alfred [name], Alfred [name]. Date of Service: 10/30/2016 8:00 AM Medical Record Number: [id number] Patient Account Number: [id number] Date of Birth/Sex: [birth year] (51 y.o. 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 ([id number]) -------------------------------------------------------------------------------- Progress Note Details Patient Name: Alfred [name], Alfred J. Date of Service: 10/30/2016 8:00 AM Medical Record Number: [id number] Patient Account Number: [id number] Date of Birth/Sex: Mar 22, 1966 (51 y.o. Male) Treating RN: Alfred Sitesorthy, Joanna Primary Care Provider: PATIENT, NO Other  Clinician: Referring Provider: Loleta RoseFORBACH, CORY Treating Provider/Extender: Alfred ReBritto, Julena Barbour Weeks in Treatment: 0 Subjective Chief Complaint Information obtained from Patient Patient seen for complaints of Non-Healing Wound to the left medial ankle which she's had for 11 months History of Present Illness (HPI) The following HPI elements were documented for the patient's wound: Location: Patient presents with an ulcer on the left medial ankle Quality: Patient reports experiencing a sharp pain to affected area(s). Severity: Patient states wound are getting worse. Duration: Patient has had the wound for > 11 months prior to seeking treatment at the wound center Timing: Pain in wound is Intermittent (comes and goes Context: The wound would happen gradually Modifying Factors: Patient must stand for long periods while working Associated Signs and Symptoms: Patient reports having increase swelling. 51 year old man presented to the ER in early January with a nonhealing wound to the left inner ankle which he has had for about 10 months. he was treated with antibiotics a while ago because it was due to a rusty nail but the electronic medical records demonstrated that he had told the doctors it was a dog bite. As per the notes he was given Augmentin and tetanus toxoid. He drinks alcohol and smokes half pack of cigarettes every day. in the ER an x-ray of the ankle showed no evidence of osteomyelitis. Lab work done in the ER did not show any evidence of diabetes mellitus Wound History Patient presents with 1 open wound that has been present for approximately august 2017. Patient has been treating wound in the following manner: antibiotic ointment. Laboratory tests have been performed in the last month. Patient reportedly has not tested positive for an antibiotic resistant organism. Patient reportedly has not tested positive for osteomyelitis. Patient reportedly has not had testing performed to  evaluate circulation in the legs. Patient History Information obtained from Patient. Allergies No Known Allergies Alfred [name], Delaine J. ([id number]) Social History Current every day smoker, Marital Status - Single, Alcohol Use - Moderate, Drug Use - No History, Caffeine Use - Rarely. Review of Systems (ROS) Constitutional Symptoms (General Health) The patient has no complaints or symptoms. Eyes The patient has no complaints or symptoms. Ear/Nose/Mouth/Throat The patient has no complaints or symptoms. Hematologic/Lymphatic The patient has no complaints or symptoms. Respiratory The patient has no complaints or symptoms. Cardiovascular The patient has no complaints or symptoms. Gastrointestinal The patient has no complaints or symptoms. Endocrine The patient has no complaints or symptoms. Genitourinary The patient has no complaints or symptoms. Immunological The patient has no complaints or symptoms. Integumentary (Skin) The patient has no complaints or symptoms. Musculoskeletal The patient has no complaints or symptoms. Neurologic The patient has no complaints or symptoms. Oncologic The patient has no complaints or symptoms. Psychiatric The patient has no complaints or symptoms. Medications: the patient is on no regular medications. Objective Constitutional Pulse regular. Respirations normal and unlabored.  Afebrile. Alfred [name], Alfred [name] ([id number]) Vitals Time Taken: 8:18 AM, Height: 79 in, Source: Measured, Weight: 285 lbs, Source: Measured, BMI: 32.1, Temperature: 97.9 F, Pulse: 76 bpm, Respiratory Rate: 18 breaths/min, Blood Pressure: 153/74 mmHg. Eyes Nonicteric. Reactive to light. Ears, Nose, Mouth, and Throat Lips, teeth, and gums WNL.Marland Kitchen Moist mucosa without lesions. Neck supple and nontender. No palpable supraclavicular or cervical adenopathy. Normal sized without goiter. Respiratory WNL. No retractions.. Breath sounds WNL, No rubs, rales, rhonchi, or  wheeze.. Cardiovascular Heart rhythm and rate regular, no murmur or gallop.. Pedal Pulses WNL. ABIs were noncompressible. he has stigmata of venous hypertension left lower extremity.. Chest Breasts symmetical and no nipple discharge.. Breast tissue WNL, no masses, lumps, or tenderness.. Gastrointestinal (GI) Abdomen without masses or tenderness.. No liver or spleen enlargement or tenderness.. Lymphatic No adneopathy. No adenopathy. No adenopathy. Musculoskeletal Adexa without tenderness or enlargement.. Digits and nails w/o clubbing, cyanosis, infection, petechiae, ischemia, or inflammatory conditions.Marland Kitchen Psychiatric Judgement and insight Intact.. No evidence of depression, anxiety, or agitation.. General Notes: the patient has a large wound on the left medial ankle with a lot of encrustation and debris at the base of the ulcer and surrounding it and the edges are heaped. Sharp debridement was done with a #3 curet and all the eschar exudate and edges were trimmed and the base of the ulcer was curetted down to healthy granulation tissue. Minimal bleeding controlled with pressure Integumentary (Hair, Skin) No suspicious lesions. No crepitus or fluctuance. No peri-wound warmth or erythema. No masses.. Wound #1 status is Open. Original cause of wound was Not Known. The wound is located on the Left,Medial Malleolus. The wound measures 2.5cm length x 1.7cm width x 0.2cm depth; 3.338cm^2 area and 0.668cm^3 volume. There is no tunneling or undermining noted. There is a large amount of serous drainage noted. The wound margin is flat and intact. There is large (67-100%) red granulation within the wound bed. There is a small (1-33%) amount of necrotic tissue within the wound bed including Adherent Alfred [name], Alfred [name] ([id number]) Slough. The periwound skin appearance did not exhibit: Callus, Crepitus, Excoriation, Induration, Rash, Scarring, Dry/Scaly, Maceration, Atrophie Blanche, Cyanosis, Ecchymosis,  Hemosiderin Staining, Mottled, Pallor, Rubor, Erythema. Periwound temperature was noted as No Abnormality. The periwound has tenderness on palpation. 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 Elevation and exercise 2. Silver alginate with a Kerlix dressing to keep it in place to be changed daily after washing this wound with soap and water 3. Arterial Doppler studies and venous reflux studies to be done 4. I have spent 3 minutes discussing the need to completely give up smoking and I discussed the risks benefits and methodology and he will be compliant 5. adequate protein, vitamin A, vitamin C and zinc 6. Regular visits to the wound center. Procedures Wound #1 Pre-procedure diagnosis of Wound #1 is a To be determined located on the Left,Medial Malleolus . There was a Skin/Subcutaneous Tissue Debridement ([id number]) debridement with total area of 4.25 sq cm performed by Alfred Kanner, MD. with the following instrument(s): Curette to remove Viable and Non-Viable tissue/material including Fibrin/Slough, Eschar, Skin, and Subcutaneous after achieving pain control using Lidocaine 4% Topical Solution. A time out was conducted at 08:56, prior  to the start of the procedure. A Minimum amount of bleeding was controlled with Pressure. The procedure was tolerated well with a pain level of 0 throughout and a pain level of 0 following the procedure. Post Debridement Measurements: 2.7cm length x 1.7cm width x 0.2cm depth; 0.721cm^3 volume. Character of Wound/Ulcer Post Debridement is improved. Post procedure Diagnosis Wound #1: Same as Pre-Procedure Alfred [name], Alfred [name] ([id number]) Plan Wound Cleansing: Wound #1 Left,Medial Malleolus: Clean wound with Normal Saline. May Shower, gently pat wound dry prior to applying new dressing. Anesthetic: Wound #1 Left,Medial Malleolus: Topical Lidocaine 4% cream applied to wound bed prior to debridement Primary Wound Dressing: Wound #1 Left,Medial Malleolus: Aquacel Ag Secondary Dressing: Wound #1 Left,Medial Malleolus: Gauze, ABD and Kerlix/Conform Dressing Change Frequency: Wound #1 Left,Medial Malleolus: Change dressing every day. Follow-up Appointments: Wound #1 Left,Medial Malleolus: Return Appointment in 1 week. Edema Control: Wound #1 Left,Medial Malleolus: Elevate legs to the level of the heart and pump ankles as often as possible Additional Orders / Instructions: Wound #1 Left,Medial Malleolus: Stop Smoking Increase protein intake. Other: - Please add vitamin A, vitamin C and zinc supplements to your diet Services and Therapies ordered were: Arterial Studies- Bilateral, Venous Studies -Bilateral this 51 year old gentleman who has had a chronic wound to the left medial ankle has never had a workup and after review today I have recommended: 1. Elevation and exercise 2. Silver alginate with a Kerlix dressing to keep it in place to be changed daily after washing this wound with soap and water 3. Arterial Doppler studies and venous reflux studies to be done 4. I have spent 3 minutes discussing the need to completely give up smoking and I discussed the risks benefits and methodology and he will be compliant Alfred [name], Alfred [name]. ([id number]) 5. adequate protein, vitamin A, vitamin C and zinc 6. Regular visits to the wound center. Electronic Signature(s) Signed: 10/30/2016 12:43:40 PM By: Alfred Kanner MD, FACS Previous Signature: 10/30/2016 9:15:19 AM Version By: Alfred Kanner MD, FACS Entered By: Alfred [name] on 10/30/2016 12:43:39 LENVIL, SWAIM  ([id number]) -------------------------------------------------------------------------------- ROS/PFSH Details Patient Name: GINA, COSTILLA. Date of Service: 10/30/2016 8:00 AM Medical Record Number: [id number] Patient Account Number: [id number] Date of Birth/Sex: 1966/03/15 (51 y.o. Male) Treating RN: Alfred [name] Primary Care Provider: PATIENT, NO Other Clinician: Referring Provider: Loleta [name] Treating Provider/Extender: Alfred [name] in Treatment: 0 Information Obtained From Patient Wound History Do you currently have one or more open woundso Yes How many open wounds do you currently haveo 1 Approximately how long have you had your woundso august 2017 How have you been treating your wound(s) until nowo antibiotic ointment Has your wound(s) ever healed and then [name]-openedo No Have you had any lab work done in the past montho Yes Who ordered the lab work doneo [facility] ED Have you tested positive for an antibiotic resistant organism (MRSA, VRE)o No Have you tested positive for osteomyelitis (bone infection)o No Have you had any tests for circulation on your legso No Constitutional Symptoms (General Health) Complaints and Symptoms: No Complaints or Symptoms Eyes Complaints and Symptoms: No Complaints or Symptoms Ear/Nose/Mouth/Throat Complaints and Symptoms: No Complaints or Symptoms Hematologic/Lymphatic Complaints and Symptoms: No Complaints or Symptoms Respiratory Complaints and Symptoms: No Complaints or Symptoms Cardiovascular KA, FLAMMER ([id number]) Complaints and Symptoms: No Complaints or Symptoms Gastrointestinal Complaints and Symptoms: No Complaints or Symptoms Endocrine Complaints and Symptoms: No Complaints or Symptoms Genitourinary Complaints and Symptoms: No Complaints or Symptoms Immunological Complaints and Symptoms: No Complaints or Symptoms  Integumentary (Skin) Complaints and Symptoms: No Complaints or  Symptoms Musculoskeletal Complaints and Symptoms: No Complaints or Symptoms Neurologic Complaints and Symptoms: No Complaints or Symptoms Oncologic Complaints and Symptoms: No Complaints or Symptoms Psychiatric Complaints and Symptoms: No Complaints or Symptoms Immunizations Pneumococcal Vaccine: Received Pneumococcal Vaccination: No Alfred [name], Dareion J. ([id number]) Family and Social History Current every day smoker; Marital Status - Single; Alcohol Use: Moderate; Drug Use: No History; Caffeine Use: Rarely; Financial Concerns: No; Food, Clothing or Shelter Needs: No; Support System Lacking: No; Transportation Concerns: No; Advanced Directives: No; Patient does not want information on Advanced Directives Physician Affirmation I have reviewed and agree with the above information. Electronic Signature(s) Signed: 10/30/2016 3:58:43 PM By: Alfred KannerBritto, Desira Alessandrini MD, FACS Signed: 10/30/2016 4:26:29 PM By: Alfred Sitesorthy, Joanna Entered By: Alfred KannerBritto, Caitlynn Ju on 10/30/2016 08:51:22 Alfred [name], Pratham J. ([id number]) -------------------------------------------------------------------------------- SuperBill Details Patient Name: Alfred [name], Hogan J. Date of Service: 10/30/2016 Medical Record Number: [id number] Patient Account Number: [id number] Date of Birth/Sex: 12-23-1965 (51 y.o. Male) Treating RN: Alfred Sitesorthy, Joanna Primary Care Provider: PATIENT, NO Other Clinician: Referring Provider: Loleta RoseFORBACH, CORY Treating Provider/Extender: Alfred ReBritto, Nemiah Kissner Weeks in Treatment: 0 Diagnosis Coding ICD-10 Codes Code Description [phone] Non-pressure chronic ulcer of left ankle with fat layer exposed I73.9 Peripheral vascular disease, unspecified F17.218 Nicotine dependence, cigarettes, with other nicotine-induced disorders Facility Procedures CPT4 Code Description: [id number] 99213 - WOUND CARE VISIT-LEV 3 EST PT Modifier: Quantity: 1 CPT4 Code Description: [id number] 11042 - DEB SUBQ TISSUE 20 SQ CM/< ICD-10 Description Diagnosis  L97.322 Non-pressure chronic ulcer of left ankle with fat Alfred [name] I73.9 Peripheral vascular disease, unspecified F17.218 Nicotine dependence, cigarettes, with other  nicotin Modifier: ayer exposed e-induced di Quantity: 1 sorders CPT4 Code Description: [id number] 99406-SMOKING CESSATION 3-10MINS ICD-10 Description Diagnosis L97.322 Non-pressure chronic ulcer of left ankle with fat Alfred [name] I73.9 Peripheral vascular disease, unspecified F17.218 Nicotine dependence, cigarettes, with other  nicotin Modifier: ayer exposed e-induced di Quantity: 1 sorders Physician Procedures CPT4 Code Description: [id number] 99204 - WC PHYS LEVEL 4 - NEW PT ICD-10 Description Diagnosis 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 ([id number]) Modifier: Quantity: 1 Electronic Signature(s) Signed: 10/30/2016 12:44:07 PM By: Alfred KannerBritto, Eh Sauseda MD, FACS Previous Signature: 10/30/2016 9:28:03 AM Version By: Alfred Sitesorthy, Joanna Previous Signature: 10/30/2016 9:15:38 AM Version By: Alfred KannerBritto, Jadin Creque MD, FACS Entered By: Alfred KannerBritto, Avri Paiva on 10/30/2016 12:44:07

## 2016-11-02 NOTE — Progress Notes (Signed)
Alfred [name], Alfred J. ([id number]) Visit Report for 10/30/2016 Abuse/Suicide Risk Screen Details Patient Name: Alfred [name], Alfred J. Date of Service: 10/30/2016 8:00 AM Medical Record Number: [id number] Patient Account Number: [id number] Date of Birth/Sex: [birth year] (51 y.o. Male) Treating RN: Alfred [name] Primary Care Alfred [name]: PATIENT, NO Other Clinician: Referring Alfred [name]: Alfred [name] Treating Alfred [name]/Extender: Alfred [name] [name] in Treatment: 0 Abuse/Suicide Risk Screen Items Answer ABUSE/SUICIDE RISK SCREEN: Has anyone close to you tried to hurt or harm you recentlyo No Do you feel uncomfortable with anyone in your familyo No Has anyone forced you do things that you didnot want to doo No Do you have any thoughts of harming yourselfo No Patient displays signs or symptoms of abuse and/or neglect. No Electronic Signature(s) Signed: 10/30/2016 4:26:29 PM By: Alfred [name] Entered By: Alfred [name] on 10/30/2016 08:23:42 Alfred [name], Alfred J. ([id number]) -------------------------------------------------------------------------------- Activities of Daily Living Details Patient Name: Alfred [name], Alfred J. Date of Service: 10/30/2016 8:00 AM Medical Record Number: [id number] Patient Account Number: [id number] Date of Birth/Sex: [birth year] (51 y.o. Male) Treating RN: Alfred [name] Primary Care Alfred [name]: PATIENT, NO Other Clinician: Referring Alfred [name]: Alfred [name] Treating Laverne Klugh/Extender: Alfred [name] [name] in Treatment: 0 Activities of Daily Living Items Answer Activities of Daily Living (Please select one for each item) Drive Automobile Not Able Take Medications Completely Able Use Telephone Completely Able Care for Appearance Completely Able Use Toilet Completely Able Bath / Shower Completely Able Dress Self Completely Able Feed Self Completely Able Walk Completely Able Get In / Out Bed Completely Able Housework Completely Able Prepare Meals Completely Able Handle Money  Completely Able Shop for Self Completely Able Electronic Signature(s) Signed: 10/30/2016 4:26:29 PM By: Alfred [name] Entered By: Alfred [name] on 10/30/2016 08:24:00 Alfred [name], Alfred J. ([id number]) -------------------------------------------------------------------------------- Education Assessment Details Patient Name: Alfred [name], Alfred J. Date of Service: 10/30/2016 8:00 AM Medical Record Number: [id number] Patient Account Number: [id number] Date of Birth/Sex: [birth year] (51 y.o. Male) Treating RN: Alfred [name] Primary Care Aneeka Bowden: PATIENT, NO Other Clinician: Referring Alfred [name]: Alfred [name] Treating Alfred [name]/Extender: Alfred [name] [name] in Treatment: 0 Primary Learner Assessed: Patient Learning Preferences/Education Level/Primary Language Learning Preference: Explanation, Demonstration Highest Education Level: High School Preferred Language: English Cognitive Barrier Assessment/Beliefs Language Barrier: No Translator Needed: No Memory Deficit: No Emotional Barrier: No Cultural/Religious Beliefs Affecting Medical No Care: Physical Barrier Assessment Impaired Vision: No Impaired Hearing: No Decreased Hand dexterity: No Knowledge/Comprehension Assessment Knowledge Level: Medium Comprehension Level: Medium Ability to understand written Medium instructions: Ability to understand verbal Medium instructions: Motivation Assessment Anxiety Level: Calm Cooperation: Cooperative Education Importance: Acknowledges Need Interest in Health Problems: Asks Questions Perception: Coherent Willingness to Engage in Self- Medium Management Activities: Readiness to Engage in Self- Medium Management Activities: Electronic Signature(s) Alfred [name], Alfred J. ([id number]) Signed: 10/30/2016 4:26:29 PM By: Alfred [name] Entered By: Alfred [name] on 10/30/2016 08:24:21 Alfred [name], Alfred J. ([id number]) -------------------------------------------------------------------------------- Fall  Risk Assessment Details Patient Name: Alfred [name], Alfred J. Date of Service: 10/30/2016 8:00 AM Medical Record Number: [id number] Patient Account Number: [id number] Date of Birth/Sex: [birth year] (51 y.o. Male) Treating RN: Alfred [name] Primary Care Alfred [name]: PATIENT, NO Other Clinician: Referring Kamile Fassler: Alfred [name] Treating Alfred [name]/Extender: Alfred [name] [name] in Treatment: 0 Fall Risk Assessment Items Have you had 2 or more falls in the last 12 monthso 0 No Have you had any fall that resulted in injury in the last 12 monthso 0 No FALL RISK ASSESSMENT: History of falling - immediate or within 3 months 0 No Secondary diagnosis 0 No Ambulatory aid None/bed rest/wheelchair/nurse 0 Yes Crutches/cane/walker 0 No Furniture 0  No IV Access/Saline Lock 0 No Gait/Training Normal/bed rest/immobile 0 Yes Weak 0 No Impaired 0 No Mental Status Oriented to own ability 0 Yes Electronic Signature(s) Signed: 10/30/2016 4:26:29 PM By: Alfred [name] Entered By: Alfred [name] on 10/30/2016 08:24:29 Alfred [name], Alfred J. ([id number]) -------------------------------------------------------------------------------- Foot Assessment Details Patient Name: Alfred [name], Alfred J. Date of Service: 10/30/2016 8:00 AM Medical Record Number: [id number] Patient Account Number: [id number] Date of Birth/Sex: 17-Jan-1966 (51 y.o. Male) Treating RN: Alfred [name] Primary Care Alfred [name]: PATIENT, NO Other Clinician: Referring Alfred [name]: Alfred [name] Treating Alfred [name]/Extender: Alfred [name] [name] in Treatment: 0 Foot Assessment Items Site Locations + = Sensation present, - = Sensation absent, C = Callus, U = Ulcer R = Redness, W = Warmth, M = Maceration, PU = Pre-ulcerative lesion F = Fissure, S = Swelling, D = Dryness Assessment Right: Left: Other Deformity: No No Prior Foot Ulcer: No No Prior Amputation: No No Charcot Joint: No No Ambulatory Status: Ambulatory Without Help Gait: Steady Electronic  Signature(s) Signed: 10/30/2016 4:26:29 PM By: Alfred [name] Entered By: Alfred [name] on 10/30/2016 08:28:00 Alfred [name], Kell J. ([id number]) -------------------------------------------------------------------------------- Nutrition Risk Assessment Details Patient Name: Alfred [name], Dick J. Date of Service: 10/30/2016 8:00 AM Medical Record Number: [id number] Patient Account Number: [id number] Date of Birth/Sex: 17-Jan-1966 (51 y.o. Male) Treating RN: Alfred [name] Primary Care Agnes Probert: PATIENT, NO Other Clinician: Referring Nellie Chevalier: Alfred [name] Treating Lundy Cozart/Extender: Alfred [name] [name] in Treatment: 0 Height (in): 79 Weight (lbs): 285 Body Mass Index (BMI): 32.1 Nutrition Risk Assessment Items NUTRITION RISK SCREEN: I have an illness or condition that made me change the kind and/or 0 No amount of food I eat I eat fewer than two meals per day 0 No I eat few fruits and vegetables, or milk products 0 No I have three or more drinks of beer, liquor or wine almost every day 0 No I have tooth or mouth problems that make it hard for me to eat 0 No I don't always have enough money to buy the food I need 0 No I eat alone most of the time 0 No I take three or more different prescribed or over-the-counter drugs a 0 No day Without wanting to, I have lost or gained 10 pounds in the last six 0 No months I am not always physically able to shop, cook and/or feed myself 0 No Nutrition Protocols Good Risk Protocol 0 No interventions needed Moderate Risk Protocol Electronic Signature(s) Signed: 10/30/2016 4:26:29 PM By: Alfred [name] Entered By: Alfred [name] on 10/30/2016 08:24:38

## 2016-11-06 ENCOUNTER — Encounter: Payer: Self-pay | Attending: Surgery | Admitting: Surgery

## 2016-11-06 DIAGNOSIS — F17218 Nicotine dependence, cigarettes, with other nicotine-induced disorders: Secondary | ICD-10-CM | POA: Insufficient documentation

## 2016-11-06 DIAGNOSIS — I739 Peripheral vascular disease, unspecified: Secondary | ICD-10-CM | POA: Insufficient documentation

## 2016-11-06 DIAGNOSIS — L97322 Non-pressure chronic ulcer of left ankle with fat layer exposed: Secondary | ICD-10-CM | POA: Insufficient documentation

## 2016-11-09 NOTE — Progress Notes (Signed)
ARBER, WIEMERS ([id number]) Visit Report for 11/06/2016 Chief Complaint Document Details Patient Name: Alfred [name], Alfred [name]. Date of Service: 11/06/2016 12:30 PM Medical Record Number: [id number] Patient Account Number: [id number] Date of Birth/Sex: 27-May-1965 (51 y.o. Male) Treating RN: Curtis Sites Primary Care Provider: PATIENT, NO Other Clinician: Referring Provider: Loleta Rose Treating Provider/Extender: Rudene Re in Treatment: 1 Information Obtained from: Patient Chief Complaint Patient seen for complaints of Non-Healing Wound to the left medial ankle which she's had for 11 months Electronic Signature(s) Signed: 11/06/2016 1:10:01 PM By: Evlyn Kanner MD, FACS Entered By: Evlyn Kanner on 11/06/2016 13:10:00 Alfred [name], Alfred [name] ([id number]) -------------------------------------------------------------------------------- Debridement Details Patient Name: Alfred [name]. Date of Service: 11/06/2016 12:30 PM Medical Record Number: [id number] Patient Account Number: [id number] Date of Birth/Sex: [birth year] (51 y.o. Male) Treating RN: Curtis Sites Primary Care Provider: PATIENT, NO Other Clinician: Referring Provider: Loleta Rose Treating Provider/Extender: Rudene Re in Treatment: 1 Debridement Performed for Wound #1 Left,Medial Malleolus Assessment: Performed By: Physician Evlyn Kanner, MD Debridement: Debridement Pre-procedure Verification/Time Out Yes - 13:02 Taken: Start Time: 13:02 Pain Control: Lidocaine 4% Topical Solution Level: Skin/Subcutaneous Tissue Total Area Debrided (L x 2.5 (cm) x 1.4 (cm) = 3.5 (cm) W): Tissue and other Viable, Non-Viable, Fibrin/Slough, Subcutaneous material debrided: Instrument: Curette Bleeding: Minimum Hemostasis Achieved: Pressure End Time: 13:04 Procedural Pain: 0 Post Procedural Pain: 0 Response to Treatment: Procedure was tolerated well Post Debridement Measurements of Total Wound Length: (cm)  2.5 Width: (cm) 1.4 Depth: (cm) 0.3 Volume: (cm) 0.825 Character of Wound/Ulcer Post Improved Debridement: Post Procedure Diagnosis Same as Pre-procedure Electronic Signature(s) Signed: 11/06/2016 1:09:55 PM By: Evlyn Kanner MD, FACS Signed: 11/06/2016 4:59:41 PM By: Curtis Sites Entered By: Evlyn Kanner on 11/06/2016 13:09:54 Alfred [name] ([id number]) -------------------------------------------------------------------------------- HPI Details Patient Name: Alfred [name]. Date of Service: 11/06/2016 12:30 PM Medical Record Number: [id number] Patient Account Number: [id number] Date of Birth/Sex: [birth year] (51 y.o. Male) Treating RN: Curtis Sites Primary Care Provider: PATIENT, NO Other Clinician: Referring Provider: Loleta Rose Treating Provider/Extender: Rudene Re in Treatment: 1 History of Present Illness Location: Patient presents with an ulcer on the left medial ankle Quality: Patient reports experiencing a sharp pain to affected area(s). Severity: Patient states wound are getting worse. Duration: Patient has had the wound for > 11 months prior to seeking treatment at the wound center Timing: Pain in wound is Intermittent (comes and goes Context: The wound would happen gradually Modifying Factors: Patient must stand for long periods while working Associated Signs and Symptoms: Patient reports having increase swelling. HPI Description: 51 year old man presented to the ER in early January with a nonhealing wound to the left inner ankle which he has had for about 10 months. he was treated with antibiotics a while ago because it was due to a rusty nail but the electronic medical records demonstrated that he had told the doctors it was a dog bite. As per the notes he was given Augmentin and tetanus toxoid. He drinks alcohol and smokes half pack of cigarettes every day. in the ER an x-ray of the ankle showed no evidence of osteomyelitis. Lab work done in the  ER did not show any evidence of diabetes mellitus Electronic Signature(s) Signed: 11/06/2016 1:10:05 PM By: Evlyn Kanner MD, FACS Entered By: Evlyn Kanner on 11/06/2016 13:10:05 Alfred [name], Alfred [name] ([id number]) -------------------------------------------------------------------------------- Physical Exam Details Patient Name: Alfred [name], Alfred [name]. Date of Service: 11/06/2016 12:30 PM Medical Record Number: [id number] Patient Account Number: [id number] Date of Birth/Sex: 01/20/1966 (51 y.o. Male)  Treating RN: Curtis Sites Primary Care Provider: PATIENT, NO Other Clinician: Referring Provider: Loleta Rose Treating Provider/Extender: Rudene Re in Treatment: 1 Constitutional . Pulse regular. Respirations normal and unlabored. Afebrile. . Eyes Nonicteric. Reactive to light. Ears, Nose, Mouth, and Throat Lips, teeth, and gums WNL.Marland Kitchen Moist mucosa without lesions. Neck supple and nontender. No palpable supraclavicular or cervical adenopathy. Normal sized without goiter. Respiratory WNL. No retractions.. Cardiovascular Pedal Pulses WNL. No clubbing, cyanosis or edema. Lymphatic No adneopathy. No adenopathy. No adenopathy. Musculoskeletal Adexa without tenderness or enlargement.. Digits and nails w/o clubbing, cyanosis, infection, petechiae, ischemia, or inflammatory conditions.. Integumentary (Hair, Skin) No suspicious lesions. No crepitus or fluctuance. No peri-wound warmth or erythema. No masses.Marland Kitchen Psychiatric Judgement and insight Intact.. No evidence of depression, anxiety, or agitation.. Notes after sharp debridement today the base of the wound has healthy granulation tissue and the edges are nicely saucerized. Minimal bleeding controlled with pressure Electronic Signature(s) Signed: 11/06/2016 1:11:42 PM By: Evlyn Kanner MD, FACS Entered By: Evlyn Kanner on 11/06/2016 13:11:42 Alfred [name], Alfred [name]  ([id number]) -------------------------------------------------------------------------------- Physician Orders Details Patient Name: Alfred [name]. Date of Service: 11/06/2016 12:30 PM Medical Record Number: [id number] Patient Account Number: [id number] Date of Birth/Sex: 28-Sep-1965 (51 y.o. Male) Treating RN: Curtis Sites Primary Care Provider: PATIENT, NO Other Clinician: Referring Provider: Loleta Rose Treating Provider/Extender: Rudene Re in Treatment: 1 Verbal / Phone Orders: No Diagnosis Coding Wound Cleansing Wound #1 Left,Medial Malleolus o Clean wound with Normal Saline. o May Shower, gently pat wound dry prior to applying new dressing. Anesthetic Wound #1 Left,Medial Malleolus o Topical Lidocaine 4% cream applied to wound bed prior to debridement Primary Wound Dressing Wound #1 Left,Medial Malleolus o Aquacel Ag Secondary Dressing Wound #1 Left,Medial Malleolus o Gauze, ABD and Kerlix/Conform Dressing Change Frequency Wound #1 Left,Medial Malleolus o Change dressing every day. Follow-up Appointments Wound #1 Left,Medial Malleolus o Return Appointment in 1 week. Edema Control Wound #1 Left,Medial Malleolus o Elevate legs to the level of the heart and pump ankles as often as possible Additional Orders / Instructions Wound #1 Left,Medial Malleolus o Stop Smoking o Increase protein intake. o Other: - Please add vitamin A, vitamin C and zinc supplements to your diet Alfred [name], Alfred [name] ([id number]) Electronic Signature(s) Signed: 11/06/2016 4:02:51 PM By: Evlyn Kanner MD, FACS Signed: 11/06/2016 4:59:41 PM By: Curtis Sites Entered By: Curtis Sites on 11/06/2016 13:02:41 Alfred [name], Alfred [name] ([id number]) -------------------------------------------------------------------------------- Problem List Details Patient Name: Alfred [name], Alfred [name]. Date of Service: 11/06/2016 12:30 PM Medical Record Number: [id number] Patient Account Number:  [id number] Date of Birth/Sex: 25-May-1965 (51 y.o. 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 Date of Service: 11/06/2016 12:30 PM Medical Record Number: [id number] Patient Account Number: [id number] Date of Birth/Sex: 04/22/1965 (51 y.o. Male) Treating RN: Curtis Sites Primary Care Provider: PATIENT, NO Other Clinician: Referring Provider: Loleta Rose Treating Provider/Extender: Rudene Re in Treatment: 1 Subjective Chief Complaint Information obtained from Patient Patient seen for complaints of Non-Healing Wound to  the left medial ankle which she's had for 11 months History of Present Illness (HPI) The following HPI elements were documented for the patient's wound: Location: Patient presents with an ulcer on the left medial ankle Quality: Patient reports experiencing a sharp pain to affected area(s). Severity: Patient states wound are getting worse. Duration: Patient has had the wound for > 11 months prior to seeking treatment at the wound center Timing: Pain in wound is Intermittent (comes and goes Context: The wound would happen gradually Modifying Factors: Patient must stand for long  periods while working Associated Signs and Symptoms: Patient reports having increase swelling. 51 year old man presented to the ER in early January with a nonhealing wound to the left inner ankle which he has had for about 10 months. he was treated with antibiotics a while ago because it was due to a rusty nail but the electronic medical records demonstrated that he had told the doctors it was a dog bite. As per the notes he was given Augmentin and tetanus toxoid. He drinks alcohol and smokes half pack of cigarettes every day. in the ER an x-ray of the ankle showed no evidence of osteomyelitis. Lab work done in the ER did not show any evidence of diabetes mellitus Objective Constitutional Pulse regular. Respirations normal and unlabored. Afebrile. Vitals Time Taken: 12:54 PM, Height: 79 in, Weight: 285 lbs, BMI: 32.1, Temperature: 98.3 F, Pulse: 68 bpm, Respiratory Rate: 18 breaths/min, Blood Pressure: 141/88 mmHg. Alfred [name], Alfred [name] ([id number]) Eyes Nonicteric. Reactive to light. Ears, Nose, Mouth, and Throat Lips, teeth, and gums WNL.Marland Kitchen Moist mucosa without lesions. Neck supple and nontender. No palpable supraclavicular or cervical adenopathy. Normal sized without goiter. Respiratory WNL. No retractions.. Cardiovascular Pedal Pulses WNL. No clubbing, cyanosis or edema. Lymphatic No adneopathy. No adenopathy. No adenopathy. Musculoskeletal Adexa without tenderness or enlargement.. Digits and nails w/o clubbing, cyanosis, infection, petechiae, ischemia, or inflammatory conditions.Marland Kitchen Psychiatric Judgement and insight Intact.. No evidence of depression, anxiety, or agitation.. General Notes: after sharp debridement today the base of the wound has healthy granulation tissue and the edges are nicely saucerized. Minimal bleeding controlled with pressure Integumentary (Hair, Skin) No suspicious lesions. No crepitus or fluctuance. No peri-wound warmth or erythema. No masses.. Wound #1  status is Open. Original cause of wound was Not Known. The wound is located on the Left,Medial Malleolus. The wound measures 2.5cm length x 1.4cm width x 0.2cm depth; 2.749cm^2 area and 0.55cm^3 volume. There is no tunneling or undermining noted. There is a large amount of serous drainage noted. The wound margin is flat and intact. There is large (67-100%) red granulation within the wound bed. There is a small (1-33%) amount of necrotic tissue within the wound bed including Adherent Slough. The periwound skin appearance did not exhibit: Callus, Crepitus, Excoriation, Induration, Rash, Scarring, Dry/Scaly, Maceration, Atrophie Blanche, Cyanosis, Ecchymosis, Hemosiderin Staining, Mottled, Pallor, Rubor, Erythema. Periwound temperature was noted as No Abnormality. The periwound has tenderness on palpation. Assessment Alfred [name], Alfred [name] ([id number]) Active Problems ICD-10 [phone] - Non-pressure chronic ulcer of left ankle with fat layer exposed I73.9 - Peripheral vascular disease, unspecified F17.218 - Nicotine dependence, cigarettes, with other nicotine-induced disorders Procedures Wound #1 Pre-procedure diagnosis of Wound #1 is a To be determined located on the Left,Medial Malleolus . There was a Skin/Subcutaneous Tissue Debridement ([id number]) debridement with total area of 3.5 sq cm performed by Evlyn Kanner, MD. with the following instrument(s): Curette to remove Viable and Non-Viable tissue/material including Fibrin/Slough and Subcutaneous after achieving pain control using Lidocaine 4%  Topical Solution. A time out was conducted at 13:02, prior to the start of the procedure. A Minimum amount of bleeding was controlled with Pressure. The procedure was tolerated well with a pain level of 0 throughout and a pain level of 0 following the procedure. Post Debridement Measurements: 2.5cm length x 1.4cm width x 0.3cm depth; 0.825cm^3 volume. Character of Wound/Ulcer Post Debridement is  improved. Post procedure Diagnosis Wound #1: Same as Pre-Procedure Plan Wound Cleansing: Wound #1 Left,Medial Malleolus: Clean wound with Normal Saline. May Shower, gently pat wound dry prior to applying new dressing. Anesthetic: Wound #1 Left,Medial Malleolus: Topical Lidocaine 4% cream applied to wound bed prior to debridement Primary Wound Dressing: Wound #1 Left,Medial Malleolus: Aquacel Ag Secondary Dressing: Wound #1 Left,Medial Malleolus: Gauze, ABD and Kerlix/Conform Dressing Change Frequency: Wound #1 Left,Medial Malleolus: Change dressing every day. Follow-up Appointments: Alfred HanROGERS, Alfred J. ([id number]) Wound #1 Left,Medial Malleolus: Return Appointment in 1 week. Edema Control: Wound #1 Left,Medial Malleolus: Elevate legs to the level of the heart and pump ankles as often as possible Additional Orders / Instructions: Wound #1 Left,Medial Malleolus: Stop Smoking Increase protein intake. Other: - Please add vitamin A, vitamin C and zinc supplements to your diet after review in sharp debridement today, I have recommended: 1. Elevation and exercise 2. Silver alginate with a Kerlix dressing to keep it in place to be changed daily after washing this wound with soap and water 3. Arterial Doppler studies and venous reflux studies to be done -- appointment still pending 4. I have reieterated the need to completely give up smoking. 5. adequate protein, vitamin A, vitamin C and zinc 6. Regular visits to the wound center. Electronic Signature(s) Signed: 11/06/2016 1:13:54 PM By: Evlyn KannerBritto, Charlesia Canaday MD, FACS Entered By: Evlyn KannerBritto, Shalunda Lindh on 11/06/2016 13:13:54 Alfred HanROGERS, Kelan J. ([id number]) -------------------------------------------------------------------------------- SuperBill Details Patient Name: Alfred HanROGERS, Barkley J. Date of Service: 11/06/2016 Medical Record Number: [id number] Patient Account Number: [id number] Date of Birth/Sex: December 08, 1965 (51 y.o. Male) Treating RN: Curtis Sitesorthy,  Joanna Primary Care Provider: PATIENT, NO Other Clinician: Referring Provider: Loleta RoseFORBACH, CORY Treating Provider/Extender: Rudene ReBritto, Antoinette Haskett Weeks in Treatment: 1 Diagnosis Coding ICD-10 Codes Code Description [phone] Non-pressure chronic ulcer of left ankle with fat layer exposed I73.9 Peripheral vascular disease, unspecified F17.218 Nicotine dependence, cigarettes, with other nicotine-induced disorders Facility Procedures CPT4 Code Description: [id number] 11042 - DEB SUBQ TISSUE 20 SQ CM/< ICD-10 Description Diagnosis L97.322 Non-pressure chronic ulcer of left ankle with fat l I73.9 Peripheral vascular disease, unspecified F17.218 Nicotine dependence, cigarettes, with other  nicotin Modifier: ayer exposed e-induced di Quantity: 1 sorders Physician Procedures CPT4 Code Description: [id number] 11042 - WC PHYS SUBQ TISS 20 SQ CM ICD-10 Description Diagnosis L97.322 Non-pressure chronic ulcer of left ankle with fat l I73.9 Peripheral vascular disease, unspecified F17.218 Nicotine dependence, cigarettes, with other  nicotin Modifier: ayer exposed e-induced di Quantity: 1 sorders Electronic Signature(s) Signed: 11/06/2016 1:14:06 PM By: Evlyn KannerBritto, Rusty Glodowski MD, FACS Entered By: Evlyn KannerBritto, Brynley Cuddeback on 11/06/2016 13:14:05

## 2016-11-09 NOTE — Progress Notes (Addendum)
Alfred [name], Alfred J. ([id number]) Visit Report for 11/06/2016 Arrival Information Details Patient Name: Alfred [name], Alfred J. Date of Service: 11/06/2016 12:30 PM Medical Record Number: [id number] Patient Account Number: [id number] Date of Birth/Sex: Mar 06, 1966 (51 y.o. Male) Treating RN: Curtis Sitesorthy, Joanna Primary Care Dulcey Riederer: PATIENT, NO Other Clinician: Referring Veleka Djordjevic: Alfred [name], Alfred Treating Jericho Cieslik/Extender: Rudene ReBritto, Errol Weeks in Treatment: 1 Visit Information History Since Last Visit Added or deleted any medications: No Patient Arrived: Ambulatory Any new allergies or adverse reactions: No Arrival Time: 12:51 Had a fall or experienced change in No Accompanied By: self activities of daily living that may affect Transfer Assistance: None risk of falls: Patient Identification Verified: Yes Signs or symptoms of abuse/neglect since last No Secondary Verification Process Yes visito Completed: Hospitalized since last visit: No Patient Has Alerts: Yes Has Dressing in Place as Prescribed: Yes Patient Alerts: ABI [place] Pain Present Now: Yes >220 Electronic Signature(s) Signed: 11/06/2016 4:59:41 PM By: Curtis Sitesorthy, Joanna Entered By: Curtis Sitesorthy, Joanna on 11/06/2016 12:51:53 Alfred [name], Alfred J. ([id number]) -------------------------------------------------------------------------------- Encounter Discharge Information Details Patient Name: Alfred [name], Alfred J. Date of Service: 11/06/2016 12:30 PM Medical Record Number: [id number] Patient Account Number: [id number] Date of Birth/Sex: Mar 06, 1966 (51 y.o. Male) Treating RN: Curtis Sitesorthy, Joanna Primary Care Messi Twedt: PATIENT, NO Other Clinician: Referring Mariluz Crespo: Alfred [name], Alfred Treating Hansika Leaming/Extender: Rudene ReBritto, Errol Weeks in Treatment: 1 Encounter Discharge Information Items Discharge Pain Level: 0 Discharge Condition: Stable Ambulatory Status: Ambulatory Discharge Destination: Home Transportation: Private Auto Accompanied By: self Schedule Follow-up  Appointment: Yes Medication Reconciliation completed and provided to Patient/Care No Egan Sahlin: Provided on Clinical Summary of Care: 11/06/2016 Form Type Recipient Paper Patient TR Electronic Signature(s) Signed: 11/06/2016 2:23:49 PM By: Curtis Sitesorthy, Joanna Previous Signature: 11/06/2016 1:13:17 PM Version By: Gwenlyn PerkingMoore, Shelia Entered By: Curtis Sitesorthy, Joanna on 11/06/2016 14:23:49 Alfred [name], Alfred J. ([id number]) -------------------------------------------------------------------------------- Lower Extremity Assessment Details Patient Name: Alfred [name], Alfred J. Date of Service: 11/06/2016 12:30 PM Medical Record Number: [id number] Patient Account Number: [id number] Date of Birth/Sex: Mar 06, 1966 (51 y.o. Male) Treating RN: Curtis Sitesorthy, Joanna Primary Care Ronika Kelson: PATIENT, NO Other Clinician: Referring Britain Anagnos: Alfred [name], Alfred Treating Jayd Forrey/Extender: Rudene ReBritto, Errol Weeks in Treatment: 1 Vascular Assessment Pulses: Dorsalis Pedis Palpable: [Left:Yes] Posterior Tibial Extremity colors, hair growth, and conditions: Extremity Color: [Left:Normal] Hair Growth on Extremity: [Left:No] Temperature of Extremity: [Left:Warm] Capillary Refill: [Left:< 3 seconds] Electronic Signature(s) Signed: 11/06/2016 4:59:41 PM By: Curtis Sitesorthy, Joanna Entered By: Curtis Sitesorthy, Joanna on 11/06/2016 12:58:14 Alfred [name], Alfred J. ([id number]) -------------------------------------------------------------------------------- Multi Wound Chart Details Patient Name: Alfred [name], Alfred J. Date of Service: 11/06/2016 12:30 PM Medical Record Number: [id number] Patient Account Number: [id number] Date of Birth/Sex: Mar 06, 1966 (51 y.o. Male) Treating RN: Curtis Sitesorthy, Joanna Primary Care Divit Stipp: PATIENT, NO Other Clinician: Referring Aira Sallade: Alfred [name], Alfred Treating Janda Cargo/Extender: Rudene ReBritto, Errol Weeks in Treatment: 1 Vital Signs Height(in): 79 Pulse(bpm): 68 Weight(lbs): 285 Blood Pressure 141/88 (mmHg): Body Mass Index(BMI): 32 Temperature(F):  98.3 Respiratory Rate 18 (breaths/min): Photos: [1:No Photos] [N/A:N/A] Wound Location: [1:Left Malleolus - Medial] [N/A:N/A] Wounding Event: [1:Not Known] [N/A:N/A] Primary Etiology: [1:To be determined] [N/A:N/A] Date Acquired: [1:11/25/2015] [N/A:N/A] Weeks of Treatment: [1:1] [N/A:N/A] Wound Status: [1:Open] [N/A:N/A] Measurements L x W x D 2.5x1.4x0.2 [N/A:N/A] (cm) Area (cm) : [1:2.749] [N/A:N/A] Volume (cm) : [1:0.55] [N/A:N/A] % Reduction in Area: [1:17.60%] [N/A:N/A] % Reduction in Volume: 17.70% [N/A:N/A] Classification: [1:Full Thickness Without Exposed Support Structures] [N/A:N/A] Exudate Amount: [1:Large] [N/A:N/A] Exudate Type: [1:Serous] [N/A:N/A] Exudate Color: [1:amber] [N/A:N/A] Foul Odor After [1:Yes] [N/A:N/A] Cleansing: Odor Anticipated Due to No [N/A:N/A] Product Use: Wound Margin: [1:Flat and Intact] [N/A:N/A] Granulation Amount: [1:Large (67-100%)] [N/A:N/A] Granulation  Quality: [1:Red] [N/A:N/A] Necrotic Amount: [1:Small (1-33%)] [N/A:N/A] Exposed Structures: [1:Fascia: No Fat Layer (Subcutaneous Tissue) Exposed: No] [N/A:N/A] Tendon: No Muscle: No Joint: No Bone: No Epithelialization: None N/A N/A Debridement: Debridement (40981- N/A N/A 11047) Pre-procedure 13:02 N/A N/A Verification/Time Out Taken: Pain Control: Lidocaine 4% Topical N/A N/A Solution Tissue Debrided: Fibrin/Slough, N/A N/A Subcutaneous Level: Skin/Subcutaneous N/A N/A Tissue Debridement Area (sq 3.5 N/A N/A cm): Instrument: Curette N/A N/A Bleeding: Minimum N/A N/A Hemostasis Achieved: Pressure N/A N/A Procedural Pain: 0 N/A N/A Post Procedural Pain: 0 N/A N/A Debridement Treatment Procedure was tolerated N/A N/A Response: well Post Debridement 2.5x1.4x0.3 N/A N/A Measurements L x W x D (cm) Post Debridement 0.825 N/A N/A Volume: (cm) Periwound Skin Texture: Excoriation: No N/A N/A Induration: No Callus: No Crepitus: No Rash: No Scarring: No Periwound  Skin Maceration: No N/A N/A Moisture: Dry/Scaly: No Periwound Skin Color: Atrophie Blanche: No N/A N/A Cyanosis: No Ecchymosis: No Erythema: No Hemosiderin Staining: No Mottled: No Pallor: No Rubor: No Temperature: No Abnormality N/A N/A Tenderness on Yes N/A N/A Palpation: Wound Preparation: N/A N/A NYSIR, FERGUSSON ([id number]) Ulcer Cleansing: Rinsed/Irrigated with Saline, Other: soap and water Topical Anesthetic Applied: Other: lidocaine 4% Procedures Performed: Debridement N/A N/A Treatment Notes Electronic Signature(s) Signed: 11/06/2016 1:09:49 PM By: Evlyn Kanner MD, FACS Entered By: Evlyn Kanner on 11/06/2016 13:09:48 WYLAN, GENTZLER ([id number]) -------------------------------------------------------------------------------- Multi-Disciplinary Care Plan Details Patient Name: Alfred [name], CLABO. Date of Service: 11/06/2016 12:30 PM Medical Record Number: [id number] Patient Account Number: [id number] Date of Birth/Sex: 10-16-1965 (51 y.o. Male) Treating RN: Curtis Sites Primary Care Makailee Nudelman: PATIENT, NO Other Clinician: Referring Glendola Friedhoff: Alfred Rose Treating Trena Dunavan/Extender: Rudene Re in Treatment: 1 Active Inactive Electronic Signature(s) Signed: 12/15/2016 8:12:03 AM By: Elliot Gurney, BSN, RN, CWS, Kim RN, BSN Signed: 12/21/2016 1:05:44 PM By: Curtis Sites Previous Signature: 11/06/2016 4:59:41 PM Version By: Curtis Sites Entered By: Elliot Gurney BSN, RN, CWS, Kim on 12/10/2016 09:09:55 Alfred [name], Alfred [name] ([id number]) -------------------------------------------------------------------------------- Pain Assessment Details Patient Name: Alfred [name], GALLINA. Date of Service: 11/06/2016 12:30 PM Medical Record Number: [id number] Patient Account Number: [id number] Date of Birth/Sex: 1965/09/06 (51 y.o. Male) Treating RN: Curtis Sites Primary Care Darris Carachure: PATIENT, NO Other Clinician: Referring Kristene Liberati: Alfred Rose Treating Niko Jakel/Extender: Rudene Re in Treatment: 1 Active Problems Location of Pain Severity and Description of Pain Patient Has Paino Yes Site Locations Pain Location: Pain in Ulcers With Dressing Change: Yes Duration of the Pain. Constant / Intermittento Constant Pain Management and Medication Current Pain Management: Notes Topical or injectable lidocaine is offered to patient for acute pain when surgical debridement is performed. If needed, Patient is instructed to use over the counter pain medication for the following 24-48 hours after debridement. Wound care MDs do not prescribed pain medications. Patient has chronic pain or uncontrolled pain. Patient has been instructed to make an appointment with their Primary Care Physician for pain management. Electronic Signature(s) Signed: 11/06/2016 4:59:41 PM By: Curtis Sites Entered By: Curtis Sites on 11/06/2016 12:52:07 Alfred [name] ([id number]) -------------------------------------------------------------------------------- Patient/Caregiver Education Details Patient Name: Alfred [name], ERWAY. Date of Service: 11/06/2016 12:30 PM Medical Record Number: [id number] Patient Account Number: [id number] Date of Birth/Gender: [birth year] (51 y.o. Male) Treating RN: Curtis Sites Primary Care Physician: PATIENT, NO Other Clinician: Referring Physician: Loleta Rose Treating Physician/Extender: Rudene Re in Treatment: 1 Education Assessment Education Provided To: Patient Education Topics Provided Wound/Skin Impairment: Handouts: Other: wound care as ordered Methods: Demonstration, Explain/Verbal Responses: State content correctly Electronic Signature(s) Signed: 11/06/2016 4:59:41 PM By:  Dorthy, Mardene Celeste Entered By: Curtis Sites on 11/06/2016 14:24:13 Alfred [name] ([id number]) -------------------------------------------------------------------------------- Wound Assessment Details Patient Name: Alfred [name], FESPERMAN. Date of Service:  11/06/2016 12:30 PM Medical Record Number: [id number] Patient Account Number: [id number] Date of Birth/Sex: 28-Nov-1965 (51 y.o. Male) Treating RN: Curtis Sites Primary Care Diezel Mazur: PATIENT, NO Other Clinician: Referring Lynnmarie Lovett: Alfred Rose Treating Breion Novacek/Extender: Rudene Re in Treatment: 1 Wound Status Wound Number: 1 Primary Etiology: To be determined Wound Location: Left Malleolus - Medial Wound Status: Open Wounding Event: Not Known Date Acquired: 11/25/2015 Weeks Of Treatment: 1 Clustered Wound: No Photos Photo Uploaded By: Curtis Sites on 11/06/2016 16:45:57 Wound Measurements Length: (cm) 2.5 Width: (cm) 1.4 Depth: (cm) 0.2 Area: (cm) 2.749 Volume: (cm) 0.55 % Reduction in Area: 17.6% % Reduction in Volume: 17.7% Epithelialization: None Tunneling: No Undermining: No Wound Description Full Thickness Without Exposed Classification: Support Structures Wound Margin: Flat and Intact Exudate Large Amount: Exudate Type: Serous Exudate Color: amber Foul Odor After Cleansing: Yes Due to Product Use: No Slough/Fibrino Yes Wound Bed Granulation Amount: Large (67-100%) Exposed Structure Granulation Quality: Red Fascia Exposed: No Necrotic Amount: Small (1-33%) Fat Layer (Subcutaneous Tissue) Exposed: No ROSHUN, KLINGENSMITH ([id number]) Necrotic Quality: Adherent Slough Tendon Exposed: No Muscle Exposed: No Joint Exposed: No Bone Exposed: No Periwound Skin Texture Texture Color No Abnormalities Noted: No No Abnormalities Noted: No Callus: No Atrophie Blanche: No Crepitus: No Cyanosis: No Excoriation: No Ecchymosis: No Induration: No Erythema: No Rash: No Hemosiderin Staining: No Scarring: No Mottled: No Pallor: No Moisture Rubor: No No Abnormalities Noted: No Dry / Scaly: No Temperature / Pain Maceration: No Temperature: No Abnormality Tenderness on Palpation: Yes Wound Preparation Ulcer Cleansing: Rinsed/Irrigated with  Saline, Other: soap and water, Topical Anesthetic Applied: Other: lidocaine 4%, Electronic Signature(s) Signed: 11/06/2016 4:59:41 PM By: Curtis Sites Entered By: Curtis Sites on 11/06/2016 12:57:09 LUCIFER, SOJA ([id number]) -------------------------------------------------------------------------------- Vitals Details Patient Name: ERBY, SANDERSON. Date of Service: 11/06/2016 12:30 PM Medical Record Number: [id number] Patient Account Number: [id number] Date of Birth/Sex: 1965/07/20 (51 y.o. Male) Treating RN: Curtis Sites Primary Care Carroll Ranney: PATIENT, NO Other Clinician: Referring Nicolae Vasek: Alfred Rose Treating Starla Deller/Extender: Rudene Re in Treatment: 1 Vital Signs Time Taken: 12:54 Temperature (F): 98.3 Height (in): 79 Pulse (bpm): 68 Weight (lbs): 285 Respiratory Rate (breaths/min): 18 Body Mass Index (BMI): 32.1 Blood Pressure (mmHg): 141/88 Reference Range: 80 - 120 mg / dl Electronic Signature(s) Signed: 11/06/2016 4:59:41 PM By: Curtis Sites Entered By: Curtis Sites on 11/06/2016 12:54:26

## 2016-11-12 ENCOUNTER — Emergency Department
Admission: EM | Admit: 2016-11-12 | Discharge: 2016-11-12 | Disposition: A | Discharge status: Home / Self Care | Payer: Self-pay | Attending: Emergency Medicine | Admitting: Emergency Medicine

## 2016-11-12 ENCOUNTER — Encounter: Payer: Self-pay | Admitting: Medical Oncology

## 2016-11-12 DIAGNOSIS — M722 Plantar fascial fibromatosis: Secondary | ICD-10-CM | POA: Insufficient documentation

## 2016-11-12 DIAGNOSIS — M79672 Pain in left foot: Secondary | ICD-10-CM | POA: Insufficient documentation

## 2016-11-12 DIAGNOSIS — M79671 Pain in right foot: Secondary | ICD-10-CM | POA: Insufficient documentation

## 2016-11-12 DIAGNOSIS — F17210 Nicotine dependence, cigarettes, uncomplicated: Secondary | ICD-10-CM | POA: Insufficient documentation

## 2016-11-12 MED ORDER — IBUPROFEN 200 MG PO TABS: 200.0000 mg | ORAL_TABLET | ORAL | 0 refills | Status: DC | PRN

## 2016-11-12 MED ORDER — IBUPROFEN 400 MG PO TABS
400.0000 mg | ORAL_TABLET | Freq: Once | ORAL | Status: AC
  Administered 2016-11-12: 400 mg via ORAL
  Filled 2016-11-12: qty 1

## 2016-11-12 NOTE — ED Provider Notes (Signed)
[facility] Emergency Department Provider Note  ____________________________________________  Time seen: Approximately 10:55 AM  I have reviewed the triage vital signs and the nursing notes.   HISTORY  Chief Complaint Foot Pain    HPI Alfred [name] is a 51 y.o. male that presents to the emergency department with bilateral heel pain for 3 days.Pain is worse with pressure to both feet. Pain started 3 days ago but worsened this morning. He does not have any pain when he is just sitting. When he stands, pain starts in his heel and shoots forward. He wears size 18 shoes. 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 No trauma. He has a wound on his left ankle that is seeing woundcare for. Had an appointment with them 6 days ago. He denies fever, shortness of breath, chest pain, nausea, vomiting, abdominal pain.   History reviewed. No pertinent past medical history.  There are no active problems to display for this patient.   No past surgical history on file.  Prior to Admission medications   Medication Sig Start Date End Date Taking? Authorizing Provider  ibuprofen (MOTRIN IB) 200 MG tablet Take 1 tablet (200 mg total) by mouth every 4 (four) hours as needed. 11/12/16 11/12/17  Enid Derry, PA-C    Allergies Patient has no known allergies.  No family history on file.  Social History Social History  Substance Use Topics  . Smoking status: Current Every Day Smoker    Packs/day: 0.50    Types: Cigarettes  . Smokeless tobacco: Never Used  . Alcohol use Yes     Comment: 3x a week     Review of Systems  Constitutional: No fever/chills Cardiovascular: No chest pain. Respiratory: No SOB. Gastrointestinal: No abdominal pain.  No nausea, no vomiting.  Musculoskeletal: Positive for foot pain. Skin: Negative for rash, abrasions, lacerations, ecchymosis. Neurological: Negative for headaches, numbness or  tingling   ____________________________________________   PHYSICAL EXAM:  VITAL SIGNS: ED Triage Vitals  Enc Vitals Group     BP 11/12/16 0949 139/74     Pulse Rate 11/12/16 0949 77     Resp 11/12/16 0949 18     Temp 11/12/16 0949 97.6 F (36.4 C)     Temp Source 11/12/16 0949 Oral     SpO2 11/12/16 0949 99 %     Weight 11/12/16 0950 280 lb (127 kg)     Height 11/12/16 0950 6\' 7"  (2.007 m)     Head Circumference --      Peak Flow --      Pain Score 11/12/16 0949 10     Pain Loc --      Pain Edu? --      Excl. in GC? --      Constitutional: Alert and oriented. Well appearing and in no acute distress. Eyes: Conjunctivae are normal. PERRL. EOMI. Head: Atraumatic. ENT:      Ears:      Nose: No congestion/rhinnorhea.      Mouth/Throat: Mucous membranes are moist.  Neck: No stridor.   Cardiovascular: Normal rate, regular rhythm.  Good peripheral circulation. 2+ dorsalis pedis pulses. Respiratory: Normal respiratory effort without tachypnea or retractions. Lungs CTAB. Good air entry to the bases with no decreased or absent breath sounds. Musculoskeletal: Full range of motion to all extremities. No gross deformities appreciated. Tenderness to palpation over bilateral heels. Pain worsens with standing.  Neurologic:  Normal speech and language. No gross focal neurologic deficits are  appreciated.  Skin:  Skin is warm, dry. Open wound to medial left ankle with granulation tissue and thickened surrounding skin.  No surrounding erythema or evidence of cellulitis. No discharge/purulence.  Psychiatric: Mood and affect are normal. Speech and behavior are normal. Patient exhibits appropriate insight and judgement.   ____________________________________________   LABS (all labs ordered are listed, but only abnormal results are displayed)  Labs Reviewed - No data to  display ____________________________________________  EKG   ____________________________________________  RADIOLOGY No results found.  ____________________________________________    PROCEDURES  Procedure(s) performed:    Procedures    Medications  ibuprofen (ADVIL,MOTRIN) tablet 400 mg (400 mg Oral Given 11/12/16 1122)     ____________________________________________   INITIAL IMPRESSION / ASSESSMENT AND PLAN / ED COURSE  Pertinent labs & imaging results that were available during my care of the patient were reviewed by me and considered in my medical decision making (see chart for details).  Review of the [place] CSRS was performed in accordance of the [facility] prior to dispensing any controlled drugs.  Patient presented to the emergency department with worsening bilateral heel pain for 3 days. Vital signs and exam are reassuring. No trauma. We discussed doing an x-ray and I think it is unlikely to show anything in both feet at this time. Patient has tenderness to palpation over bilateral heels that is worse with standing. Symptoms are consistent with plantar fasciitis. Patient is actively following with wound care regarding the open wound on his left ankle. Patient will be discharged home with prescriptions for ibuprofen. Patient is to follow up with podiatry as directed. Patient is given ED precautions to return to the ED for any worsening or new symptoms.     ____________________________________________  FINAL CLINICAL IMPRESSION(S) / ED DIAGNOSES  Final diagnoses:  Foot pain, right  Foot pain, left  Plantar fasciitis, bilateral      NEW MEDICATIONS STARTED DURING THIS VISIT:  Discharge Medication List as of 11/12/2016 11:32 AM    START taking these medications   Details  ibuprofen (MOTRIN IB) 200 MG tablet Take 1 tablet (200 mg total) by mouth every 4 (four) hours as needed., Starting Thu 11/12/2016, Until Fri 11/12/2017, Print            This chart was  dictated using voice recognition software/Dragon. Despite best efforts to proofread, errors can occur which can change the meaning. Any change was purely unintentional.    Enid DerryWagner, Kwabena Strutz, PA-C 11/12/16 1602    Pershing ProudSchaevitz, Myra Rudeavid Matthew, MD 11/13/16 [phone]

## 2016-11-12 NOTE — ED Notes (Signed)
Pt reports getting ready for work and when he stood up it was difficult because both of his feet hurt bad. Pt is seen at the Wound center for a wound on his left leg. Pt reports is not a diabetic. Pt reports that he stands a lot at work. Pts amkles appear swollen but patient reports it is normal. Pt reports when he walks a lot the pain is not so bad but when he sits and stands back up it hurts bad.

## 2016-11-12 NOTE — ED Notes (Signed)
Pt tolerated walker in room

## 2016-11-12 NOTE — ED Triage Notes (Signed)
Pt reports he woke up this am and when he tried to stand he had pain to the bottom of both his feet. Pt denies injury.

## 2016-11-16 ENCOUNTER — Ambulatory Visit: Payer: Self-pay | Admitting: Nurse Practitioner

## 2016-12-03 ENCOUNTER — Ambulatory Visit: Payer: Self-pay

## 2016-12-03 ENCOUNTER — Encounter: Payer: Self-pay | Admitting: Emergency Medicine

## 2016-12-03 ENCOUNTER — Emergency Department: Payer: Self-pay

## 2016-12-03 ENCOUNTER — Ambulatory Visit: Admission: RE | Admit: 2016-12-03 | Payer: Self-pay | Source: Ambulatory Visit

## 2016-12-03 ENCOUNTER — Other Ambulatory Visit: Payer: Self-pay | Admitting: Emergency Medicine

## 2016-12-03 ENCOUNTER — Emergency Department
Admission: EM | Admit: 2016-12-03 | Discharge: 2016-12-03 | Disposition: A | Discharge status: Home / Self Care | Payer: Self-pay | Attending: Emergency Medicine | Admitting: Emergency Medicine

## 2016-12-03 DIAGNOSIS — M1712 Unilateral primary osteoarthritis, left knee: Secondary | ICD-10-CM | POA: Insufficient documentation

## 2016-12-03 DIAGNOSIS — F17210 Nicotine dependence, cigarettes, uncomplicated: Secondary | ICD-10-CM | POA: Insufficient documentation

## 2016-12-03 DIAGNOSIS — M25562 Pain in left knee: Secondary | ICD-10-CM | POA: Insufficient documentation

## 2016-12-03 DIAGNOSIS — Z79899 Other long term (current) drug therapy: Secondary | ICD-10-CM | POA: Insufficient documentation

## 2016-12-03 DIAGNOSIS — G8929 Other chronic pain: Secondary | ICD-10-CM | POA: Insufficient documentation

## 2016-12-03 MED ORDER — MELOXICAM 15 MG PO TABS: 15.0000 mg | ORAL_TABLET | Freq: Every day | ORAL | 2 refills | Status: DC

## 2016-12-03 NOTE — ED Provider Notes (Signed)
[facility] Emergency Department Provider Note  ____________________________________________   First MD Initiated Contact with Patient 12/03/16 1340     (approximate)  I have reviewed the triage vital signs and the nursing notes.   HISTORY  Chief Complaint Knee Pain   HPI Alfred [name] is a 51 y.o. male complaint left knee pain has been going on for approximately 1 month. Patient states that he did not have a recent injury. He states that pain increases after he is been on his feet all day. He is not taking any over-the-counter medication for his knee pain. Patient states he does not have a PCP. He rates his pain as 10 over 10.  History reviewed. No pertinent past medical history.  There are no active problems to display for this patient.   Past Surgical History:  Procedure Laterality Date  . DEBRIDEMENT OF ABDOMINAL WALL ABSCESS      Prior to Admission medications   Medication Sig Start Date End Date Taking? Authorizing Provider  ibuprofen (MOTRIN IB) 200 MG tablet Take 1 tablet (200 mg total) by mouth every 4 (four) hours as needed. 11/12/16 11/12/17  Enid Derry, PA-C  meloxicam (MOBIC) 15 MG tablet Take 1 tablet (15 mg total) by mouth daily. 12/03/16 12/03/17  Tommi Rumps, PA-C    Allergies Patient has no known allergies.  No family history on file.  Social History Social History  Substance Use Topics  . Smoking status: Current Every Day Smoker    Packs/day: 0.50    Types: Cigarettes  . Smokeless tobacco: Never Used  . Alcohol use Yes     Comment: 3x a week    Review of Systems Constitutional: No fever/chills Cardiovascular: Denies chest pain. Respiratory: Denies shortness of breath. Gastrointestinal:   No nausea, no vomiting.  Musculoskeletal: Positive for left knee pain. Skin: Negative for rash. Neurological: Negative for headaches, focal weakness or  numbness. ____________________________________________   PHYSICAL EXAM:  VITAL SIGNS: ED Triage Vitals  Enc Vitals Group     BP 12/03/16 1139 (!) 154/77     Pulse Rate 12/03/16 1139 61     Resp 12/03/16 1139 18     Temp 12/03/16 1139 98.2 F (36.8 C)     Temp Source 12/03/16 1139 Oral     SpO2 12/03/16 1139 100 %     Weight 12/03/16 1139 280 lb (127 kg)     Height 12/03/16 1139 6\' 7"  (2.007 m)     Head Circumference --      Peak Flow --      Pain Score 12/03/16 1138 10     Pain Loc --      Pain Edu? --      Excl. in GC? --    Constitutional: Alert and oriented. Well appearing and in no acute distress. Eyes: Conjunctivae are normal.  Head: Atraumatic. Nose: No congestion/rhinnorhea. Neck: No stridor.   Cardiovascular: Normal rate, regular rhythm. Grossly normal heart sounds.  Good peripheral circulation. Respiratory: Normal respiratory effort.  No retractions. Lungs CTAB. Gastrointestinal: Soft and nontender. No distention.  Musculoskeletal: On examination of the left knee it appears very arthritic in nature. There is no evidence of injury. There is no redness or warmth. There is moderate crepitus on range of motion. Range of motion is limited secondary to discomfort. Ligaments were stable bilaterally. Patient is able to bear weight on the extremity. There is no edema noted in the lower extremity. Neurologic:  Normal speech and language. No gross  focal neurologic deficits are appreciated. No gait instability. Skin:  Skin is warm, dry and intact. No erythema, ecchymosis or abrasions noted. Psychiatric: Mood and affect are normal. Speech and behavior are normal.  ____________________________________________   LABS (all labs ordered are listed, but only abnormal results are displayed)  Labs Reviewed - No data to display ____________________________________________   RADIOLOGY  Dg Knee Complete 4 Views Left  Result Date: 12/03/2016 CLINICAL DATA:  C/o left knee pain that  has been ongoing for 3 weeks and has increased in discomfort. Denies any injury to the leg but states he is on his feet all day for work and it has gotten worse. EXAM: LEFT KNEE - COMPLETE 4+ VIEW COMPARISON:  None. FINDINGS: No fracture. There is a bony projection from the medial aspect of the distal femoral metaphysis. This may reflect an osteochondroma or a large enthesiophyte. No other bone lesion. The joint is normally aligned. There is minor narrowing of the mediolateral joint space compartments and small marginal osteophytes from all 3 compartments. No other arthropathic change. Small joint effusion. Soft tissues are unremarkable. IMPRESSION: 1. No fracture or acute finding. 2. Mild osteoarthritis. 3. 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 Electronically Signed   By: Amie Portlandavid  Ormond M.D.   On: 12/03/2016 13:22    ____________________________________________   PROCEDURES  Procedure(s) performed: None  Procedures  Critical Care performed: No  ____________________________________________   INITIAL IMPRESSION / ASSESSMENT AND PLAN / ED COURSE  Pertinent labs & imaging results that were available during my care of the patient were reviewed by me and considered in my medical decision making (see chart for details).  A picture of the  patient's knee was shown to him to explain his osteoarthritis. Patient was placed in knee immobilizer for the next several days to give him extra support. He'll follow-up with the orthopedist on-call who is Dr. Odis LusterBowers. He was given a prescription for meloxicam 15 mg one daily with food. Patient was given a note for work today.   ____________________________________________   FINAL CLINICAL IMPRESSION(S) / ED DIAGNOSES  Final diagnoses:  Chronic pain of left knee  Osteoarthritis of left knee, unspecified osteoarthritis type      NEW MEDICATIONS STARTED DURING THIS VISIT:  Discharge Medication  List as of 12/03/2016  2:15 PM    START taking these medications   Details  meloxicam (MOBIC) 15 MG tablet Take 1 tablet (15 mg total) by mouth daily., Starting Thu 12/03/2016, Until Fri 12/03/2017, Print         Note:  This document was prepared using Dragon voice recognition software and may include unintentional dictation errors.    Tommi RumpsSummers, Phebe Dettmer L, PA-C 12/03/16 1545    Arnaldo NatalMalinda, Paul F, MD 12/03/16 [phone]

## 2016-12-03 NOTE — Discharge Instructions (Signed)
Follow-up with Dr. Odis LusterBowers if any continued problems with your knee. Begin taking meloxicam 15 mg 1 tablet daily with food. Use knee immobilizer for added support when you're walking.

## 2016-12-03 NOTE — ED Notes (Signed)
See triage note  Presents with pain to left knee   States pain started about 3 weeks ago  No injury  But having increased pain with standing or walking

## 2016-12-03 NOTE — ED Triage Notes (Signed)
Pt comes into the ED via POV c/o left knee pain that has been ongoing for 3 weeks and has increased in discomfort.  Denies any injury to the leg but states he is on his feet all day for work and it has gotten worse.  Patient in NAD at this time with even and unlabored respirations.

## 2017-06-21 ENCOUNTER — Emergency Department
Admission: EM | Admit: 2017-06-21 | Discharge: 2017-06-21 | Disposition: A | Discharge status: Home / Self Care | Payer: Self-pay | Attending: Emergency Medicine | Admitting: Emergency Medicine

## 2017-06-21 ENCOUNTER — Encounter: Payer: Self-pay | Admitting: Emergency Medicine

## 2017-06-21 ENCOUNTER — Emergency Department: Payer: Self-pay

## 2017-06-21 ENCOUNTER — Other Ambulatory Visit: Payer: Self-pay

## 2017-06-21 DIAGNOSIS — F17210 Nicotine dependence, cigarettes, uncomplicated: Secondary | ICD-10-CM | POA: Insufficient documentation

## 2017-06-21 DIAGNOSIS — J069 Acute upper respiratory infection, unspecified: Secondary | ICD-10-CM | POA: Insufficient documentation

## 2017-06-21 DIAGNOSIS — R0789 Other chest pain: Secondary | ICD-10-CM | POA: Insufficient documentation

## 2017-06-21 DIAGNOSIS — B9789 Other viral agents as the cause of diseases classified elsewhere: Secondary | ICD-10-CM | POA: Insufficient documentation

## 2017-06-21 LAB — CBC WITH DIFFERENTIAL/PLATELET
BASOS PCT: 1 %
Basophils Absolute: 0 10*3/uL (ref 0–0.1)
Eosinophils Absolute: 0.1 10*3/uL (ref 0–0.7)
Eosinophils Relative: 2 %
HEMATOCRIT: 37.4 % — AB (ref 40.0–52.0)
HEMOGLOBIN: 12.1 g/dL — AB (ref 13.0–18.0)
LYMPHS ABS: 0.8 10*3/uL — AB (ref 1.0–3.6)
Lymphocytes Relative: 19 %
MCH: 22.9 pg — AB (ref 26.0–34.0)
MCHC: 32.4 g/dL (ref 32.0–36.0)
MCV: 70.7 fL — AB (ref 80.0–100.0)
MONO ABS: 0.5 10*3/uL (ref 0.2–1.0)
MONOS PCT: 12 %
NEUTROS PCT: 66 %
Neutro Abs: 2.9 10*3/uL (ref 1.4–6.5)
Platelets: 189 10*3/uL (ref 150–440)
RBC: 5.29 MIL/uL (ref 4.40–5.90)
RDW: 17.3 % — AB (ref 11.5–14.5)
WBC: 4.4 10*3/uL (ref 3.8–10.6)

## 2017-06-21 LAB — URINALYSIS, COMPLETE (UACMP) WITH MICROSCOPIC
Bacteria, UA: NONE SEEN
Bilirubin Urine: NEGATIVE
GLUCOSE, UA: NEGATIVE mg/dL
Hgb urine dipstick: NEGATIVE
KETONES UR: NEGATIVE mg/dL
Nitrite: NEGATIVE
PH: 5 (ref 5.0–8.0)
Protein, ur: NEGATIVE mg/dL
SPECIFIC GRAVITY, URINE: 1.013 (ref 1.005–1.030)

## 2017-06-21 LAB — COMPREHENSIVE METABOLIC PANEL
ALBUMIN: 3.2 g/dL — AB (ref 3.5–5.0)
ALT: 30 U/L (ref 17–63)
ANION GAP: 10 (ref 5–15)
AST: 32 U/L (ref 15–41)
Alkaline Phosphatase: 53 U/L (ref 38–126)
BILIRUBIN TOTAL: 0.5 mg/dL (ref 0.3–1.2)
BUN: 13 mg/dL (ref 6–20)
CO2: 23 mmol/L (ref 22–32)
Calcium: 8.4 mg/dL — ABNORMAL LOW (ref 8.9–10.3)
Chloride: 100 mmol/L — ABNORMAL LOW (ref 101–111)
Creatinine, Ser: 0.65 mg/dL (ref 0.61–1.24)
GLUCOSE: 99 mg/dL (ref 65–99)
Potassium: 4 mmol/L (ref 3.5–5.1)
Sodium: 133 mmol/L — ABNORMAL LOW (ref 135–145)
TOTAL PROTEIN: 7.4 g/dL (ref 6.5–8.1)

## 2017-06-21 LAB — TROPONIN I

## 2017-06-21 MED ORDER — GUAIFENESIN-CODEINE 100-10 MG/5ML PO SOLN
5.0000 mL | ORAL | 0 refills | Status: DC | PRN
Start: 2017-06-21 — End: 2019-11-29

## 2017-06-21 NOTE — ED Notes (Signed)
FIRST NURSE NOTE: pt comes into the ED via EMS from home with c/o chest pain since 8pm last night. Pt is in NAD on arrival, EMS reports ECG unremarkable. Respirations WNL, skin is warm and dry..Marland Kitchen

## 2017-06-21 NOTE — ED Triage Notes (Signed)
Cough x 1 week yesterday started bilateral chest wall pain with cough and with inspiration. Cough productive green.

## 2017-06-21 NOTE — ED Provider Notes (Signed)
-----------------------------------------   2:06 PM on 06/21/2017 -----------------------------------------  Do not take direct  care of this patient, EKG however shows some normal sinus rhythm at 62 bpm no acute ST elevation or depression, RSR prime configuration, normal axis.   Jeanmarie PlantMcShane, Kelsea Mousel A, MD 06/21/17 1407

## 2017-06-21 NOTE — ED Provider Notes (Signed)
[facility] Emergency Department Provider Note  ____________________________________________   First MD Initiated Contact with Patient 06/21/17 1137     (approximate)  I have reviewed the triage vital signs and the nursing notes.   HISTORY  Chief Complaint Cough and Chest Pain   HPI Alfred [name] is a 52 y.o. male is here via EMS with complaint of cough for 1 week and chest pain that began last night.  Patient states he has had a green productive cough for 1 week.  He has not taken any over-the-counter medication for this.  He states that at times he has been slightly short of breath.  He denies any previous cardiac issues, no hypertension and no diabetes.  He denies any diaphoresis, indigestion, nausea, vomiting or radiation of his pain other than in his anterior chest.  He is unaware of any fever.  Patient continues to smoke a third of a pack of cigarettes per day.  He rates his pain is not over 10.   History reviewed. No pertinent past medical history.  There are no active problems to display for this patient.   Past Surgical History:  Procedure Laterality Date  . DEBRIDEMENT OF ABDOMINAL WALL ABSCESS      Prior to Admission medications   Medication Sig Start Date End Date Taking? Authorizing Provider  guaiFENesin-codeine 100-10 MG/5ML syrup Take 5 mLs by mouth every 4 (four) hours as needed. 06/21/17   Tommi RumpsSummers, Fabrizio Filip L, PA-C    Allergies Patient has no known allergies.  No family history on file.  Social History Social History   Tobacco Use  . Smoking status: Current Every Day Smoker    Packs/day: 0.50    Types: Cigarettes  . Smokeless tobacco: Never Used  Substance Use Topics  . Alcohol use: Yes    Comment: 3x a week  . Drug use: Yes    Types: Marijuana    Review of Systems Constitutional: No fever/chills Eyes: No visual changes. ENT: No sore throat. Cardiovascular: Positive for chest pain. Respiratory: Denies shortness  of breath.  Positive productive cough. Gastrointestinal: No abdominal pain.  No nausea, no vomiting.   Musculoskeletal: Negative for back pain. Skin: Negative for rash. Neurological: Negative for headaches, focal weakness or numbness. ___________________________________________   PHYSICAL EXAM:  VITAL SIGNS: ED Triage Vitals  Enc Vitals Group     BP 06/21/17 1049 (!) 164/96     Pulse Rate 06/21/17 1049 72     Resp 06/21/17 1049 20     Temp 06/21/17 1049 98.8 F (37.1 C)     Temp Source 06/21/17 1049 Oral     SpO2 06/21/17 1049 94 %     Weight 06/21/17 1053 (!) 350 lb (158.8 kg)     Height 06/21/17 1053 6\' 7"  (2.007 m)     Head Circumference --      Peak Flow --      Pain Score 06/21/17 1053 9     Pain Loc --      Pain Edu? --      Excl. in GC? --    Constitutional: Alert and oriented. Well appearing and in no acute distress. Eyes: Conjunctivae are normal.  Head: Atraumatic. Nose: No congestion/rhinnorhea. Mouth/Throat: Mucous membranes are moist.  Oropharynx non-erythematous. Neck: No stridor.   Hematological/Lymphatic/Immunilogical: No cervical lymphadenopathy. Cardiovascular: Normal rate, regular rhythm. Grossly normal heart sounds.  Good peripheral circulation. Respiratory: Normal respiratory effort.  No retractions. Lungs without wheezing but patient has a very congested cough.  Gastrointestinal: Soft and nontender. No distention.  Musculoskeletal: Moves upper and lower extremities without any difficulty.  Normal gait was noted. Neurologic:  Normal speech and language. No gross focal neurologic deficits are appreciated.  Skin:  Skin is warm, dry and intact. No rash noted. Psychiatric: Mood and affect are normal. Speech and behavior are normal.  ____________________________________________   LABS (all labs ordered are listed, but only abnormal results are displayed)  Labs Reviewed  URINALYSIS, COMPLETE (UACMP) WITH MICROSCOPIC - Abnormal; Notable for the following  components:      Result Value   Color, Urine YELLOW (*)    APPearance CLEAR (*)    Leukocytes, UA TRACE (*)    Squamous Epithelial / LPF 6-30 (*)    All other components within normal limits  CBC WITH DIFFERENTIAL/PLATELET - Abnormal; Notable for the following components:   Hemoglobin 12.1 (*)    HCT 37.4 (*)    MCV 70.7 (*)    MCH 22.9 (*)    RDW 17.3 (*)    Lymphs Abs 0.8 (*)    All other components within normal limits  COMPREHENSIVE METABOLIC PANEL - Abnormal; Notable for the following components:   Sodium 133 (*)    Chloride 100 (*)    Calcium 8.4 (*)    Albumin 3.2 (*)    All other components within normal limits  TROPONIN I    RADIOLOGY  ED MD interpretation:   Chest x-ray is negative for pneumonia.  Official radiology report(s): Dg Chest 2 View  Result Date: 06/21/2017 CLINICAL DATA:  Cough and chest pain EXAM: CHEST - 2 VIEW COMPARISON:  December 13, 2013 FINDINGS: Lungs are clear. Heart size and pulmonary vascularity are normal. No adenopathy. No pneumothorax. No bone lesions. IMPRESSION: No edema or consolidation. Electronically Signed   By: Bretta Bang III M.D.   On: 06/21/2017 11:19    ____________________________________________   PROCEDURES  Procedure(s) performed: None  Procedures  Critical Care performed: No  ____________________________________________   INITIAL IMPRESSION / ASSESSMENT AND PLAN / ED COURSE  Patient was reassured that his productive coughing is most likely the source of his chest wall pain.  Patient was given a prescription for guaifenesin and codeine as needed for cough and congestion.  He is to continue with increase fluids and Tylenol or ibuprofen as needed for muscle aches.  He will follow-up with [facility] acute care if any continued problems as he does not have a PCP.  He was given a [facility] clinics [facility] so that he may establish primary  care. ____________________________________________   FINAL CLINICAL IMPRESSION(S) / ED DIAGNOSES  Final diagnoses:  Viral URI with cough  Anterior chest wall pain     ED Discharge Orders        Ordered    guaiFENesin-codeine 100-10 MG/5ML syrup  Every 4 hours PRN     06/21/17 1423       Note:  This document was prepared using Dragon voice recognition software and may include unintentional dictation errors.    Tommi Rumps, PA-C 06/21/17 1618    Jeanmarie Plant, MD 06/22/17 [phone]

## 2017-06-21 NOTE — Discharge Instructions (Signed)
Follow[facility] if any continued problems.  You [facility] also [facility] if any continued problems.  Begin taking guaifenesin with codeine as needed for cough and congestion.  Take Tylenol or ibuprofen as needed for muscle aches.  Increase fluids.

## 2017-06-21 NOTE — ED Notes (Signed)
See triage note   Presents with cough for several days  And also having some discomfort in chest with cough and breathing.  States cough has been prod at times  Pt arrived by EMS

## 2017-10-18 ENCOUNTER — Telehealth: Payer: Self-pay | Admitting: Emergency Medicine

## 2017-10-18 ENCOUNTER — Emergency Department
Admission: EM | Admit: 2017-10-18 | Discharge: 2017-10-18 | Disposition: A | Discharge status: Home / Self Care | Payer: Self-pay | Attending: Emergency Medicine | Admitting: Emergency Medicine

## 2017-10-18 ENCOUNTER — Encounter: Payer: Self-pay | Admitting: Emergency Medicine

## 2017-10-18 DIAGNOSIS — Z5321 Procedure and treatment not carried out due to patient leaving prior to being seen by health care provider: Secondary | ICD-10-CM | POA: Insufficient documentation

## 2017-10-18 DIAGNOSIS — S91002D Unspecified open wound, left ankle, subsequent encounter: Secondary | ICD-10-CM | POA: Insufficient documentation

## 2017-10-18 DIAGNOSIS — Y33XXXD Other specified events, undetermined intent, subsequent encounter: Secondary | ICD-10-CM | POA: Insufficient documentation

## 2017-10-18 LAB — BASIC METABOLIC PANEL
ANION GAP: 10 (ref 5–15)
BUN: 17 mg/dL (ref 6–20)
CO2: 23 mmol/L (ref 22–32)
CREATININE: 0.7 mg/dL (ref 0.61–1.24)
Calcium: 8.6 mg/dL — ABNORMAL LOW (ref 8.9–10.3)
Chloride: 104 mmol/L (ref 98–111)
GFR calc Af Amer: >60 mL/min (ref >60)
GLUCOSE: 86 mg/dL (ref 70–99)
Potassium: 4.4 mmol/L (ref 3.5–5.1)
Sodium: 137 mmol/L (ref 135–145)

## 2017-10-18 LAB — CBC
HCT: 39.5 % — ABNORMAL LOW (ref 40.0–52.0)
Hemoglobin: 12.7 g/dL — ABNORMAL LOW (ref 13.0–18.0)
MCH: 23 pg — ABNORMAL LOW (ref 26.0–34.0)
MCHC: 32.2 g/dL (ref 32.0–36.0)
MCV: 71.5 fL — AB (ref 80.0–100.0)
PLATELETS: 178 10*3/uL (ref 150–440)
RBC: 5.52 MIL/uL (ref 4.40–5.90)
RDW: 16.2 % — AB (ref 11.5–14.5)
WBC: 5.2 10*3/uL (ref 3.8–10.6)

## 2017-10-18 NOTE — Telephone Encounter (Signed)
Called patient due to lwot to inquire about condition and follow up plans. One number no answer and the other is not in service.

## 2017-10-18 NOTE — ED Triage Notes (Signed)
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 Patient states he has been seen by the wound clinic x 2 years. Today it "busted open". No purulent drainage noted. Patient eating chips during triage, reports 10/10 pain.

## 2017-10-18 NOTE — ED Notes (Signed)
Patient was called by pt advocate with no answer.

## 2017-12-27 ENCOUNTER — Emergency Department
Admission: EM | Admit: 2017-12-27 | Discharge: 2017-12-27 | Disposition: A | Discharge status: Home / Self Care | Payer: Self-pay | Attending: Emergency Medicine | Admitting: Emergency Medicine

## 2017-12-27 ENCOUNTER — Encounter: Payer: Self-pay | Admitting: Emergency Medicine

## 2017-12-27 ENCOUNTER — Other Ambulatory Visit: Payer: Self-pay

## 2017-12-27 DIAGNOSIS — R55 Syncope and collapse: Secondary | ICD-10-CM | POA: Insufficient documentation

## 2017-12-27 DIAGNOSIS — R42 Dizziness and giddiness: Secondary | ICD-10-CM | POA: Insufficient documentation

## 2017-12-27 DIAGNOSIS — F17210 Nicotine dependence, cigarettes, uncomplicated: Secondary | ICD-10-CM | POA: Insufficient documentation

## 2017-12-27 DIAGNOSIS — F1210 Cannabis abuse, uncomplicated: Secondary | ICD-10-CM | POA: Insufficient documentation

## 2017-12-27 LAB — BASIC METABOLIC PANEL
Anion gap: 12 (ref 5–15)
BUN: 12 mg/dL (ref 6–20)
CO2: 24 mmol/L (ref 22–32)
CREATININE: 1.16 mg/dL (ref 0.61–1.24)
Calcium: 9.2 mg/dL (ref 8.9–10.3)
Chloride: 99 mmol/L (ref 98–111)
GFR calc Af Amer: >60 mL/min (ref >60)
Glucose, Bld: 147 mg/dL — ABNORMAL HIGH (ref 70–99)
Potassium: 3.6 mmol/L (ref 3.5–5.1)
SODIUM: 135 mmol/L (ref 135–145)

## 2017-12-27 LAB — CBC
HCT: 41.2 % (ref 40.0–52.0)
Hemoglobin: 13.4 g/dL (ref 13.0–18.0)
MCH: 23.3 pg — ABNORMAL LOW (ref 26.0–34.0)
MCHC: 32.6 g/dL (ref 32.0–36.0)
MCV: 71.5 fL — ABNORMAL LOW (ref 80.0–100.0)
PLATELETS: 173 10*3/uL (ref 150–440)
RBC: 5.76 MIL/uL (ref 4.40–5.90)
RDW: 17.6 % — AB (ref 11.5–14.5)
WBC: 4.6 10*3/uL (ref 3.8–10.6)

## 2017-12-27 NOTE — ED Notes (Signed)
Pt comes into the ED via EMS from work with c/o dizziness today, states he has not been able to eat anything for the past 4 days only fluids,. Pt has a wound to the left ankle. CBG 154..Marland Kitchen

## 2017-12-27 NOTE — ED Provider Notes (Signed)
[facility] Emergency Department Provider Note   ____________________________________________    I have reviewed the triage vital signs and the nursing notes.   HISTORY  Chief Complaint Dizziness    HPI Alfred [name] is a 52 y.o. male who presents with complaints of near syncopal episode which occurred today while at work.  Patient reports that he felt well when he got up this morning, did not eat breakfast.  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  Denies chest pain or palpitations.  No nausea or vomiting.  Does admit that he has not eaten much in the last 4 days because has not made much money.  Currently feels better, no further dizziness after resting  History reviewed. No pertinent past medical history.  There are no active problems to display for this patient.   Past Surgical History:  Procedure Laterality Date  . DEBRIDEMENT OF ABDOMINAL WALL ABSCESS      Prior to Admission medications   Medication Sig Start Date End Date Taking? Authorizing Provider  guaiFENesin-codeine 100-10 MG/5ML syrup Take 5 mLs by mouth every 4 (four) hours as needed. 06/21/17   Tommi RumpsSummers, Rhonda L, PA-C     Allergies Patient has no known allergies.  No family history on file.  Social History Social History   Tobacco Use  . Smoking status: Current Every Day Smoker    Packs/day: 0.50    Types: Cigarettes  . Smokeless tobacco: Never Used  Substance Use Topics  . Alcohol use: Yes    Comment: 3x a week  . Drug use: Yes    Types: Marijuana    Review of Systems  Constitutional: No fever/chills Eyes: No visual changes.  ENT: No neck pain Cardiovascular: Denies chest pain. Respiratory: Denies shortness of breath. Gastrointestinal: No abdominal pain.  No nausea, no vomiting.   Genitourinary: Negative for frequency Musculoskeletal: Negative for back pain. Skin: Negative for  rash. Neurological: Negative for headaches or weakness   ____________________________________________   PHYSICAL EXAM:  VITAL SIGNS: ED Triage Vitals  Enc Vitals Group     BP 12/27/17 1048 106/73     Pulse Rate 12/27/17 1048 88     Resp 12/27/17 1048 20     Temp 12/27/17 1048 98.2 F (36.8 C)     Temp Source 12/27/17 1048 Oral     SpO2 12/27/17 1048 100 %     Weight 12/27/17 1049 124.7 kg (275 lb)     Height 12/27/17 1049 2.007 m (6\' 7" )     Head Circumference --      Peak Flow --      Pain Score 12/27/17 1052 0     Pain Loc --      Pain Edu? --      Excl. in GC? --     Constitutional: Alert and oriented.  Pleasant  Nose: No congestion/rhinnorhea. Mouth/Throat: Mucous membranes are moist.    Cardiovascular: Normal rate, regular rhythm. Grossly normal heart sounds.  Good peripheral circulation. Respiratory: Normal respiratory effort.  No retractions. Lungs CTAB. Gastrointestinal: Soft and nontender. No distention.  No CVA tenderness. Genitourinary: deferred Musculoskeletal: No lower extremity tenderness nor edema.  Warm and well perfused Neurologic:  Normal speech and language. No gross focal neurologic deficits are appreciated.  Skin:  Skin is warm, dry and intact. No rash noted. Psychiatric: Mood and affect are normal. Speech and behavior are normal.  ____________________________________________   LABS (all  labs ordered are listed, but only abnormal results are displayed)  Labs Reviewed  BASIC METABOLIC PANEL - Abnormal; Notable for the following components:      Result Value   Glucose, Bld 147 (*)    All other components within normal limits  CBC - Abnormal; Notable for the following components:   MCV 71.5 (*)    MCH 23.3 (*)    RDW 17.6 (*)    All other components within normal limits  URINALYSIS, COMPLETE (UACMP) WITH MICROSCOPIC  CBG MONITORING, ED   ____________________________________________  EKG  ED ECG REPORT I, Jene Every, the attending  physician, personally viewed and interpreted this ECG.  Date: 12/27/2017  Rhythm: normal sinus rhythm QRS Axis: normal Intervals: normal ST/T Wave abnormalities: normal Narrative Interpretation: no evidence of acute ischemia  ____________________________________________  RADIOLOGY  None ____________________________________________   PROCEDURES  Procedure(s) performed: No  Procedures   Critical Care performed: No ____________________________________________   INITIAL IMPRESSION / ASSESSMENT AND PLAN / ED COURSE  Pertinent labs & imaging results that were available during my care of the patient were reviewed by me and considered in my medical decision making (see chart for details).  Patient well-appearing in no acute distress.  No further dizziness, suspect dizziness related to decreased p.o. intake over the last several days, we will feed the patient, his lab work is overall quite reassuring and exam is normal.  EKG normal.    ____________________________________________   FINAL CLINICAL IMPRESSION(S) / ED DIAGNOSES  Final diagnoses:  Near syncope        Note:  This document was prepared using Dragon voice recognition software and may include unintentional dictation errors.    Jene Every, MD 12/27/17 1321

## 2017-12-27 NOTE — ED Notes (Signed)
Per Dr Cyril LoosenKinner to give pt a lunch tray and after he eats he can be discharged

## 2017-12-27 NOTE — ED Triage Notes (Signed)
While at work today, started to feel dizzy.  Arrives to ED still c/o dizziness, worse when moving head side to side.  AAOx3.  Skin warm and dry. NAD

## 2019-11-24 ENCOUNTER — Inpatient Hospital Stay
Admission: EM | Admit: 2019-11-24 | Discharge: 2019-12-11 | DRG: 871 | Disposition: A | Discharge status: Home Health | Payer: Self-pay | Attending: Internal Medicine | Admitting: Internal Medicine

## 2019-11-24 ENCOUNTER — Emergency Department: Payer: Self-pay

## 2019-11-24 ENCOUNTER — Encounter: Payer: Self-pay | Admitting: Emergency Medicine

## 2019-11-24 ENCOUNTER — Other Ambulatory Visit: Payer: Self-pay

## 2019-11-24 DIAGNOSIS — R42 Dizziness and giddiness: Secondary | ICD-10-CM | POA: Diagnosis not present

## 2019-11-24 DIAGNOSIS — Z6841 Body Mass Index (BMI) 40.0 and over, adult: Secondary | ICD-10-CM

## 2019-11-24 DIAGNOSIS — I5033 Acute on chronic diastolic (congestive) heart failure: Secondary | ICD-10-CM

## 2019-11-24 DIAGNOSIS — I483 Typical atrial flutter: Secondary | ICD-10-CM | POA: Diagnosis not present

## 2019-11-24 DIAGNOSIS — L97919 Non-pressure chronic ulcer of unspecified part of right lower leg with unspecified severity: Secondary | ICD-10-CM | POA: Diagnosis present

## 2019-11-24 DIAGNOSIS — F17210 Nicotine dependence, cigarettes, uncomplicated: Secondary | ICD-10-CM | POA: Diagnosis present

## 2019-11-24 DIAGNOSIS — R6520 Severe sepsis without septic shock: Secondary | ICD-10-CM | POA: Diagnosis present

## 2019-11-24 DIAGNOSIS — B954 Other streptococcus as the cause of diseases classified elsewhere: Secondary | ICD-10-CM | POA: Diagnosis present

## 2019-11-24 DIAGNOSIS — R000 Tachycardia, unspecified: Secondary | ICD-10-CM

## 2019-11-24 DIAGNOSIS — R739 Hyperglycemia, unspecified: Secondary | ICD-10-CM | POA: Diagnosis not present

## 2019-11-24 DIAGNOSIS — A419 Sepsis, unspecified organism: Secondary | ICD-10-CM

## 2019-11-24 DIAGNOSIS — M25562 Pain in left knee: Secondary | ICD-10-CM

## 2019-11-24 DIAGNOSIS — B182 Chronic viral hepatitis C: Secondary | ICD-10-CM | POA: Diagnosis present

## 2019-11-24 DIAGNOSIS — L97929 Non-pressure chronic ulcer of unspecified part of left lower leg with unspecified severity: Secondary | ICD-10-CM | POA: Diagnosis present

## 2019-11-24 DIAGNOSIS — Z8249 Family history of ischemic heart disease and other diseases of the circulatory system: Secondary | ICD-10-CM

## 2019-11-24 DIAGNOSIS — Z560 Unemployment, unspecified: Secondary | ICD-10-CM

## 2019-11-24 DIAGNOSIS — Z720 Tobacco use: Secondary | ICD-10-CM

## 2019-11-24 DIAGNOSIS — I4892 Unspecified atrial flutter: Secondary | ICD-10-CM

## 2019-11-24 DIAGNOSIS — R001 Bradycardia, unspecified: Secondary | ICD-10-CM | POA: Diagnosis not present

## 2019-11-24 DIAGNOSIS — M1712 Unilateral primary osteoarthritis, left knee: Secondary | ICD-10-CM | POA: Diagnosis present

## 2019-11-24 DIAGNOSIS — I872 Venous insufficiency (chronic) (peripheral): Secondary | ICD-10-CM | POA: Diagnosis present

## 2019-11-24 DIAGNOSIS — L03116 Cellulitis of left lower limb: Secondary | ICD-10-CM | POA: Diagnosis present

## 2019-11-24 DIAGNOSIS — D509 Iron deficiency anemia, unspecified: Secondary | ICD-10-CM | POA: Diagnosis present

## 2019-11-24 DIAGNOSIS — Z20822 Contact with and (suspected) exposure to covid-19: Secondary | ICD-10-CM | POA: Diagnosis present

## 2019-11-24 DIAGNOSIS — A409 Streptococcal sepsis, unspecified: Principal | ICD-10-CM | POA: Diagnosis present

## 2019-11-24 DIAGNOSIS — R600 Localized edema: Secondary | ICD-10-CM

## 2019-11-24 DIAGNOSIS — I480 Paroxysmal atrial fibrillation: Secondary | ICD-10-CM

## 2019-11-24 DIAGNOSIS — R0602 Shortness of breath: Secondary | ICD-10-CM

## 2019-11-24 DIAGNOSIS — I5030 Unspecified diastolic (congestive) heart failure: Secondary | ICD-10-CM

## 2019-11-24 DIAGNOSIS — F1290 Cannabis use, unspecified, uncomplicated: Secondary | ICD-10-CM | POA: Diagnosis present

## 2019-11-24 DIAGNOSIS — R5381 Other malaise: Secondary | ICD-10-CM | POA: Diagnosis present

## 2019-11-24 DIAGNOSIS — Z7289 Other problems related to lifestyle: Secondary | ICD-10-CM

## 2019-11-24 DIAGNOSIS — N179 Acute kidney failure, unspecified: Secondary | ICD-10-CM

## 2019-11-24 DIAGNOSIS — L03115 Cellulitis of right lower limb: Secondary | ICD-10-CM | POA: Diagnosis present

## 2019-11-24 DIAGNOSIS — Z716 Tobacco abuse counseling: Secondary | ICD-10-CM

## 2019-11-24 DIAGNOSIS — Z8614 Personal history of Methicillin resistant Staphylococcus aureus infection: Secondary | ICD-10-CM

## 2019-11-24 DIAGNOSIS — L280 Lichen simplex chronicus: Secondary | ICD-10-CM | POA: Diagnosis present

## 2019-11-24 DIAGNOSIS — I110 Hypertensive heart disease with heart failure: Secondary | ICD-10-CM | POA: Diagnosis present

## 2019-11-24 DIAGNOSIS — E876 Hypokalemia: Secondary | ICD-10-CM | POA: Diagnosis present

## 2019-11-24 DIAGNOSIS — R7881 Bacteremia: Secondary | ICD-10-CM

## 2019-11-24 HISTORY — DX: Localized edema: R60.0

## 2019-11-24 LAB — URINALYSIS, COMPLETE (UACMP) WITH MICROSCOPIC
Bilirubin Urine: NEGATIVE
Glucose, UA: NEGATIVE mg/dL
Ketones, ur: NEGATIVE mg/dL
Leukocytes,Ua: NEGATIVE
Nitrite: NEGATIVE
Protein, ur: >=300 mg/dL — AB
Specific Gravity, Urine: 1.018 (ref 1.005–1.030)
pH: 5 (ref 5.0–8.0)

## 2019-11-24 LAB — CBC WITH DIFFERENTIAL/PLATELET
Abs Immature Granulocytes: 0.07 10*3/uL (ref 0.00–0.07)
Basophils Absolute: 0 10*3/uL (ref 0.0–0.1)
Basophils Relative: 0 %
Eosinophils Absolute: 0 10*3/uL (ref 0.0–0.5)
Eosinophils Relative: 0 %
HCT: 42 % (ref 39.0–52.0)
Hemoglobin: 12.9 g/dL — ABNORMAL LOW (ref 13.0–17.0)
Immature Granulocytes: 1 %
Lymphocytes Relative: 5 %
Lymphs Abs: 0.5 10*3/uL — ABNORMAL LOW (ref 0.7–4.0)
MCH: 21.3 pg — ABNORMAL LOW (ref 26.0–34.0)
MCHC: 30.7 g/dL (ref 30.0–36.0)
MCV: 69.4 fL — ABNORMAL LOW (ref 80.0–100.0)
Monocytes Absolute: 0.6 10*3/uL (ref 0.1–1.0)
Monocytes Relative: 6 %
Neutro Abs: 8 10*3/uL — ABNORMAL HIGH (ref 1.7–7.7)
Neutrophils Relative %: 88 %
Platelets: 185 10*3/uL (ref 150–400)
RBC: 6.05 MIL/uL — ABNORMAL HIGH (ref 4.22–5.81)
RDW: 17.5 % — ABNORMAL HIGH (ref 11.5–15.5)
Smear Review: NORMAL
WBC: 9.1 10*3/uL (ref 4.0–10.5)
nRBC: 0.2 % (ref 0.0–0.2)

## 2019-11-24 LAB — COMPREHENSIVE METABOLIC PANEL
ALT: 41 U/L (ref 0–44)
AST: 47 U/L — ABNORMAL HIGH (ref 15–41)
Albumin: 3.2 g/dL — ABNORMAL LOW (ref 3.5–5.0)
Alkaline Phosphatase: 58 U/L (ref 38–126)
Anion gap: 11 (ref 5–15)
BUN: 15 mg/dL (ref 6–20)
CO2: 26 mmol/L (ref 22–32)
Calcium: 8.4 mg/dL — ABNORMAL LOW (ref 8.9–10.3)
Chloride: 99 mmol/L (ref 98–111)
Creatinine, Ser: 1.27 mg/dL — ABNORMAL HIGH (ref 0.61–1.24)
GFR calc Af Amer: >60 mL/min (ref >60)
GFR calc non Af Amer: >60 mL/min (ref >60)
Glucose, Bld: 140 mg/dL — ABNORMAL HIGH (ref 70–99)
Potassium: 4.1 mmol/L (ref 3.5–5.1)
Sodium: 136 mmol/L (ref 135–145)
Total Bilirubin: 1.6 mg/dL — ABNORMAL HIGH (ref 0.3–1.2)
Total Protein: 8.3 g/dL — ABNORMAL HIGH (ref 6.5–8.1)

## 2019-11-24 LAB — PROTIME-INR
INR: 1 (ref 0.8–1.2)
Prothrombin Time: 13.1 seconds (ref 11.4–15.2)

## 2019-11-24 LAB — LACTIC ACID, PLASMA: Lactic Acid, Venous: 1.9 mmol/L (ref 0.5–1.9)

## 2019-11-24 LAB — SARS CORONAVIRUS 2 BY RT PCR (HOSPITAL ORDER, PERFORMED IN ~~LOC~~ HOSPITAL LAB): SARS Coronavirus 2: NEGATIVE

## 2019-11-24 MED ORDER — ACETAMINOPHEN 325 MG PO TABS
650.0000 mg | ORAL_TABLET | Freq: Four times a day (QID) | ORAL | Status: DC | PRN
  Administered 2019-11-24 – 2019-11-29 (×8): 650 mg via ORAL
  Filled 2019-11-24 (×9): qty 2

## 2019-11-24 MED ORDER — ONDANSETRON HCL 4 MG/2ML IJ SOLN: 4.0000 mg | Freq: Four times a day (QID) | INTRAMUSCULAR | Status: DC | PRN

## 2019-11-24 MED ORDER — ONDANSETRON HCL 4 MG PO TABS: 4.0000 mg | ORAL_TABLET | Freq: Four times a day (QID) | ORAL | Status: DC | PRN

## 2019-11-24 MED ORDER — MORPHINE SULFATE (PF) 2 MG/ML IV SOLN
2.0000 mg | INTRAVENOUS | Status: DC | PRN
  Administered 2019-11-25 – 2019-12-09 (×7): 2 mg via INTRAVENOUS
  Filled 2019-11-24 (×7): qty 1

## 2019-11-24 MED ORDER — SODIUM CHLORIDE 0.9 % IV SOLN
2.0000 g | Freq: Three times a day (TID) | INTRAVENOUS | Status: DC
  Administered 2019-11-24: 2 g via INTRAVENOUS
  Filled 2019-11-24: qty 2

## 2019-11-24 MED ORDER — PIPERACILLIN-TAZOBACTAM 3.375 G IVPB
3.3750 g | Freq: Three times a day (TID) | INTRAVENOUS | Status: DC
  Administered 2019-11-25: 3.375 g via INTRAVENOUS
  Filled 2019-11-24 (×2): qty 50

## 2019-11-24 MED ORDER — HYDROCODONE-ACETAMINOPHEN 5-325 MG PO TABS
1.0000 | ORAL_TABLET | ORAL | Status: DC | PRN
  Administered 2019-11-24 – 2019-12-01 (×5): 2 via ORAL
  Administered 2019-12-02 – 2019-12-03 (×2): 1 via ORAL
  Administered 2019-12-04 (×2): 2 via ORAL
  Administered 2019-12-04: 1 via ORAL
  Administered 2019-12-05 – 2019-12-06 (×5): 2 via ORAL
  Filled 2019-11-24: qty 1
  Filled 2019-11-24 (×2): qty 2
  Filled 2019-11-24: qty 1
  Filled 2019-11-24 (×13): qty 2

## 2019-11-24 MED ORDER — VANCOMYCIN HCL IN DEXTROSE 1-5 GM/200ML-% IV SOLN: 1000.0000 mg | Freq: Once | INTRAVENOUS | Status: DC

## 2019-11-24 MED ORDER — PIPERACILLIN-TAZOBACTAM 3.375 G IVPB 30 MIN: 3.3750 g | Freq: Once | INTRAVENOUS | Status: DC

## 2019-11-24 MED ORDER — LACTATED RINGERS IV BOLUS
1000.0000 mL | Freq: Once | INTRAVENOUS | Status: AC
  Administered 2019-11-24: 1000 mL via INTRAVENOUS

## 2019-11-24 MED ORDER — VANCOMYCIN HCL 2000 MG/400ML IV SOLN
2000.0000 mg | Freq: Once | INTRAVENOUS | Status: AC
  Administered 2019-11-24: 2000 mg via INTRAVENOUS
  Filled 2019-11-24: qty 400

## 2019-11-24 MED ORDER — VANCOMYCIN HCL 750 MG/150ML IV SOLN
750.0000 mg | Freq: Two times a day (BID) | INTRAVENOUS | Status: DC
  Administered 2019-11-25 – 2019-11-27 (×5): 750 mg via INTRAVENOUS
  Filled 2019-11-24 (×8): qty 150

## 2019-11-24 MED ORDER — ACETAMINOPHEN 650 MG RE SUPP: 650.0000 mg | Freq: Four times a day (QID) | RECTAL | Status: DC | PRN

## 2019-11-24 MED ORDER — SODIUM CHLORIDE 0.9 % IV SOLN: INTRAVENOUS | Status: DC

## 2019-11-24 MED ORDER — SODIUM CHLORIDE 0.9 % IV BOLUS (SEPSIS): 1000.0000 mL | Freq: Once | INTRAVENOUS | Status: DC

## 2019-11-24 MED ORDER — ACETAMINOPHEN 500 MG PO TABS
1000.0000 mg | ORAL_TABLET | Freq: Once | ORAL | Status: AC
  Administered 2019-11-24: 1000 mg via ORAL
  Filled 2019-11-24: qty 2

## 2019-11-24 MED ORDER — ENOXAPARIN SODIUM 40 MG/0.4ML ~~LOC~~ SOLN
40.0000 mg | SUBCUTANEOUS | Status: DC
  Administered 2019-11-24 – 2019-11-26 (×3): 40 mg via SUBCUTANEOUS
  Filled 2019-11-24 (×3): qty 0.4

## 2019-11-24 NOTE — Progress Notes (Signed)
Pharmacy Antibiotic Note  Alfred [name] is a 54 y.o. male admitted on 11/24/2019 with cellulitis.  Pharmacy has been consulted for Vancomycin and Zosyn dosing.  -watch renal fxn with vanc/zosyn  Plan: Zosyn 3.375g IV q8h (4 hour infusion).   Vancomycin 2000 mg IV x 1 for Loading dose. Will continue with Vancomycin 750mg  IV q12h per nomogram (wt 113 kg  Crcl normalized= 69 ml/min) for goal trough 10-15 mcg/ml for cellulitis    Height: 6\' 7"  (200.7 cm) Weight: 113.4 kg (250 lb) IBW/kg (Calculated) : 93.7  Temp (24hrs), Avg:99.4 F (37.4 C), Min:99.4 F (37.4 C), Max:99.4 F (37.4 C)  Recent Labs  Lab 11/24/19 1720  WBC 9.1  CREATININE 1.27*  LATICACIDVEN 1.9    Estimated Creatinine Clearance: 96.7 mL/min (A) (by C-G formula based on SCr of 1.27 mg/dL (H)).    No Known Allergies  Antimicrobials this admission: Cefepime 8/20 x 1 Vanc 8/20 >> Zosyn 8/20 >>     Dose adjustments this admission:    Microbiology results: 8/20 BCx: pend   UCx:      Sputum:      MRSA PCR:    Thank you for allowing pharmacy to be a part of this patient's care.  Miken Stecher A 11/24/2019 8:29 PM

## 2019-11-24 NOTE — ED Notes (Signed)
IV team attempted x 1 for [place] IV, states unable to obtain US IV due to patient not being in bed, requested repeat consult when patient in bed.

## 2019-11-24 NOTE — ED Notes (Signed)
US at bedside

## 2019-11-24 NOTE — ED Notes (Signed)
Lab able to obtain 1 set blood cultures, unable to obtain further blood work at this time.

## 2019-11-24 NOTE — ED Notes (Signed)
IV team at bedside 

## 2019-11-24 NOTE — H&P (Signed)
History and Physical    Alfred [name] [id number] DOB: January 07, 1966 DOA: 11/24/2019  PCP: Patient, No Pcp Per   Patient coming from: home  I have personally briefly reviewed patient's old medical records in [facility]  Chief Complaint: Pain and swelling bilateral legs  HPI: Alfred [name] is a 54 y.o. male with history of chronic lower extremity venous stasis for the past 15 years but otherwise no medical history and on no medication who presents to the emergency room with 2-week history of progressively worsening bilateral lower extremity swelling, left greater than right associated with pain and skin breakdown with oozing .  He denies any prior injury, insect bites.  Has received no treatment thus far.   Has subjective fevers.  ED Course: On arrival, he had a low-grade temperature of 99 and was tachycardic around 122 with normal blood pressure.  WBC normal at 9100, lactic acid borderline at 1.9.  Blood work showing creatinine of 1.27 above baseline of 1.16 but with prior baseline around 0.7.  Urinalysis showing proteinuria.  Chest x-ray showed no active disease.  Bilateral lower extremity Doppler pending.  Twelve-lead EKG showing sinus tachycardia with no acute ST-T wave changes.  Patient started on IV fluid bolus and antibiotics for possible sepsis.  Hospitalist consulted for admission.  Review of Systems: As per HPI otherwise all other systems on review of systems negative.    History reviewed. No pertinent past medical history.  Past Surgical History:  Procedure Laterality Date  . DEBRIDEMENT OF ABDOMINAL WALL ABSCESS       reports that he has been smoking cigarettes. He has been smoking about 0.50 packs per day. He has never used smokeless tobacco. He reports current alcohol use. He reports current drug use. Drug: Marijuana.  No Known Allergies  History reviewed. No pertinent family history.    Prior to Admission medications   Medication Sig Start Date End  Date Taking? Authorizing Provider  guaiFENesin-codeine 100-10 MG/5ML syrup Take 5 mLs by mouth every 4 (four) hours as needed. 06/21/17   Tommi Rumps, PA-C    Physical Exam: Vitals:   11/24/19 1248 11/24/19 1249 11/24/19 1540 11/24/19 1900  BP: (!) 139/95  (!) 149/81 (!) 160/82  Pulse: (!) 121  (!) 122 (!) 123  Resp: (!) 24  20 20   Temp: 99.4 F (37.4 C)     TempSrc: Oral     SpO2: 100%  99% 98%  Weight:  113.4 kg    Height:  6\' 7"  (2.007 m)       Vitals:   11/24/19 1248 11/24/19 1249 11/24/19 1540 11/24/19 1900  BP: (!) 139/95  (!) 149/81 (!) 160/82  Pulse: (!) 121  (!) 122 (!) 123  Resp: (!) 24  20 20   Temp: 99.4 F (37.4 C)     TempSrc: Oral     SpO2: 100%  99% 98%  Weight:  113.4 kg    Height:  6\' 7"  (2.007 m)        Constitutional: Alert and oriented x 3 . Not in any apparent distress HEENT:      Head: Normocephalic and atraumatic.         Eyes: PERLA, EOMI, Conjunctivae are normal. Sclera is non-icteric.       Mouth/Throat: Mucous membranes are moist.       Neck: Supple with no signs of meningismus. Cardiovascular: Regular rate and rhythm. No murmurs, gallops, or rubs. 2+ symmetrical distal pulses are present . No JVD.  No 3-4+LE edema Respiratory: Respiratory effort normal .Lungs sounds clear bilaterally. No wheezes, crackles, or rhonchi.  Gastrointestinal: Soft, non tender, and non distended with positive bowel sounds. No rebound or guarding. Genitourinary: No CVA tenderness. Musculoskeletal: Nontender with normal range of motion in all extremities. No cyanosis, or erythema of extremities. Neurologic: Normal speech and language. Face is symmetric. Moving all extremities. No gross focal neurologic deficits . Skin:  Chronic venous stasis skin changes, with hyperpigmentation and skin thickening, mild lichenification  With de-roofed skin blisters with oozing of serosanguineous fluid psychiatric: Mood and affect are normal Speech and behavior are normal   Labs  on Admission: I have personally reviewed following labs and imaging studies  CBC: Recent Labs  Lab 11/24/19 1720  WBC 9.1  NEUTROABS 8.0*  HGB 12.9*  HCT 42.0  MCV 69.4*  PLT 185   Basic Metabolic Panel: Recent Labs  Lab 11/24/19 1720  NA 136  K 4.1  CL 99  CO2 26  GLUCOSE 140*  BUN 15  CREATININE 1.27*  CALCIUM 8.4*   GFR: Estimated Creatinine Clearance: 96.7 mL/min (A) (by C-G formula based on SCr of 1.27 mg/dL (H)). Liver Function Tests: Recent Labs  Lab 11/24/19 1720  AST 47*  ALT 41  ALKPHOS 58  BILITOT 1.6*  PROT 8.3*  ALBUMIN 3.2*   No results for input(s): LIPASE, AMYLASE in the last 168 hours. No results for input(s): AMMONIA in the last 168 hours. Coagulation Profile: Recent Labs  Lab 11/24/19 1720  INR 1.0   Cardiac Enzymes: No results for input(s): CKTOTAL, CKMB, CKMBINDEX, TROPONINI in the last 168 hours. BNP (last 3 results) No results for input(s): PROBNP in the last 8760 hours. HbA1C: No results for input(s): HGBA1C in the last 72 hours. CBG: No results for input(s): GLUCAP in the last 168 hours. Lipid Profile: No results for input(s): CHOL, HDL, LDLCALC, TRIG, CHOLHDL, LDLDIRECT in the last 72 hours. Thyroid Function Tests: No results for input(s): TSH, T4TOTAL, FREET4, T3FREE, THYROIDAB in the last 72 hours. Anemia Panel: No results for input(s): VITAMINB12, FOLATE, FERRITIN, TIBC, IRON, RETICCTPCT in the last 72 hours. Urine analysis:    Component Value Date/Time   COLORURINE AMBER (A) 11/24/2019 1720   APPEARANCEUR CLOUDY (A) 11/24/2019 1720   LABSPEC 1.018 11/24/2019 1720   PHURINE 5.0 11/24/2019 1720   GLUCOSEU NEGATIVE 11/24/2019 1720   HGBUR LARGE (A) 11/24/2019 1720   BILIRUBINUR NEGATIVE 11/24/2019 1720   KETONESUR NEGATIVE 11/24/2019 1720   PROTEINUR >=300 (A) 11/24/2019 1720   NITRITE NEGATIVE 11/24/2019 1720   LEUKOCYTESUR NEGATIVE 11/24/2019 1720    Radiological Exams on Admission: DG Chest Port 1  View  Result Date: 11/24/2019 CLINICAL DATA:  54 year old male with sepsis. EXAM: PORTABLE CHEST 1 VIEW COMPARISON:  Chest radiograph dated 06/21/2017. FINDINGS: The heart size and mediastinal contours are within normal limits. Both lungs are clear. The visualized skeletal structures are unremarkable. IMPRESSION: No active disease. Electronically Signed   By: Elgie Collard M.D.   On: 11/24/2019 16:41    EKG: Independently reviewed. Interpretation : Sinus tachycardia with no acute ST-T wave changes   Assessment/Plan 54 year old male with no significant past medical history presenting with bilateral lower extremity cellulitis meeting borderline sepsis criteria    Bilateral lower leg cellulitis   AKI (acute kidney injury) (HCC)   Suspect Sepsis (HCC) -Patient presented with bilateral lower extremity cellulitis, low-grade fever, tachycardia and tachypnea.  No leukocytosis but borderline lactic acid at 1.9 -IV Zosyn and vancomycin -IV hydration per  sepsis protocol -Follow blood cultures -Keep legs elevated -Wound care  DVT prophylaxis: Lovenox  Code Status: full code  Family Communication:  none  Disposition Plan: Back to previous home environment Consults called: none  Status:At the time of admission, it appears that the appropriate admission status for this patient is INPATIENT. This is judged to be reasonable and necessary in order to provide the required intensity of service to ensure the patient's safety given the presenting symptoms, physical exam findings, and initial radiographic and laboratory data in the context of their  Comorbid conditions.   Patient requires inpatient status due to high intensity of service, high risk for further deterioration and high frequency of surveillance required.   I certify that at the point of admission it is my clinical judgment that the patient will require inpatient hospital care spanning beyond 2 midnights     Andris Baumann MD [facility]     11/24/2019, 8:12 PM

## 2019-11-24 NOTE — ED Triage Notes (Signed)
See first RN note. Pt A&O x4, no fever upon arrival to ED. Wound to LLE oozing upon arrival to ED.

## 2019-11-24 NOTE — ED Notes (Signed)
This RN attempted multiple times for IV without success due to patient being sepsis workup, unable to obtain IV access at this time.

## 2019-11-24 NOTE — ED Notes (Signed)
See triage note, pt c.o swelling and wounds to BLE. Alert and oriented, clear speech. Difficulty with weight bearing

## 2019-11-24 NOTE — ED Triage Notes (Signed)
Code sepsis from home.  Oozing legs with flies per ems.  2 weeks onset.  Both legs.fever 101.4, hr 135, 97%ra ,  Glu 171.

## 2019-11-24 NOTE — ED Notes (Signed)
Md Katrinka Blazing aware that pt's IV has infiltrated. Pharmacy contacted. Per Pharmacy- apply cold compress to site for 24 hours to avoid vancomycin extravasation.

## 2019-11-24 NOTE — ED Notes (Signed)
Attempted to call report, RN to call back.

## 2019-11-24 NOTE — ED Provider Notes (Signed)
[facility] Emergency Department Provider Note ____________________________________________   First MD Initiated Contact with Patient 11/24/19 1605     (approximate)  I have reviewed the triage vital signs and the nursing notes.  HISTORY  Chief Complaint Wound Infection   HPI Alfred [name] is a 54 y.o. malewho presents to the ED for evaluation of possible wound infection.  Chart review indicates no relevant medical history.  Patient is continued cigarette smoker.   Patient presents from home with progressively worsening leg swelling, pain and oozing of his bilateral L > R lower extremities.  Patient denies any preceding or precipitating trauma, insect bites or events.  Denies medication changes.  He reports 9/10 aching pain that is constant to his left leg.  Has not taken home medications to help with this.  Patient denies recent or currently being on antibiotics.  Patient reports noting an associated red rash to his left medial thigh and knee that he reports is quite painful, indicating this is the source of his 9/10 pain.  Does report associated subjective fevers and chills, has not measured temperature at home.  Patient denies any chest pain, syncope, shortness of breath, hemoptysis, abdominal pain, vomiting or diarrhea.  History reviewed. No pertinent past medical history.  Patient Active Problem List   Diagnosis Date Noted  . Bilateral lower leg cellulitis 11/24/2019    Past Surgical History:  Procedure Laterality Date  . DEBRIDEMENT OF ABDOMINAL WALL ABSCESS      Prior to Admission medications   Medication Sig Start Date End Date Taking? Authorizing Provider  guaiFENesin-codeine 100-10 MG/5ML syrup Take 5 mLs by mouth every 4 (four) hours as needed. 06/21/17   Tommi Rumps, PA-C    Allergies Patient has no known allergies.  History reviewed. No pertinent family history.  Social History Social History   Tobacco Use  . Smoking  status: Current Every Day Smoker    Packs/day: 0.50    Types: Cigarettes  . Smokeless tobacco: Never Used  Substance Use Topics  . Alcohol use: Yes    Comment: 3x a week  . Drug use: Yes    Types: Marijuana    Review of Systems  Constitutional: Positive for subjective fever/chills Eyes: No visual changes. ENT: No sore throat. Cardiovascular: Denies chest pain. Respiratory: Denies shortness of breath. Gastrointestinal: No abdominal pain.  No nausea, no vomiting.  No diarrhea.  No constipation. Genitourinary: Negative for dysuria. Musculoskeletal: Negative for back pain.  Positive for bilateral leg pain. Skin: Negative for rash. Neurological: Negative for headaches, focal weakness or numbness.  ____________________________________________   PHYSICAL EXAM:  VITAL SIGNS: Vitals:   11/24/19 1540 11/24/19 1900  BP: (!) 149/81 (!) 160/82  Pulse: (!) 122 (!) 123  Resp: 20 20  Temp:    SpO2: 99% 98%      Constitutional: Alert and oriented.  Obese.  Sitting up in bed and uncomfortable-appearing.  Conversational in full sentences. Eyes: Conjunctivae are normal. PERRL. EOMI. Head: Atraumatic. Nose: No congestion/rhinnorhea. Mouth/Throat: Mucous membranes are dry.  Oropharynx non-erythematous. Neck: No stridor. No cervical spine tenderness to palpation. Cardiovascular: Tachycardic rate, regular rhythm. Grossly normal heart sounds.  Good peripheral circulation. Respiratory: Normal respiratory effort.  No retractions. Lungs CTAB. Gastrointestinal: Soft , nondistended, nontender to palpation. No abdominal bruits. No CVA tenderness. Musculoskeletal:   No joint effusions. No signs of acute trauma. Grossly edematous bilateral lower extremities symmetrically with pitting edema.  Couple areas of skin breakdown that is weeping clear fluid of his  distal ankles bilaterally. More proximally, on his left leg, along his medial thigh primarily is an area of warmth, erythema, tenderness and  induration.  No focal areas of fluctuance or purulence to suggest abscess. Right leg has no evidence of infectious pathology, and is just grossly edematous and swollen like the distal left leg. Neurologic:  Normal speech and language. No gross focal neurologic deficits are appreciated. No gait instability noted. Skin:  Skin is warm, dry. No rash noted beyond erythematous cellulitic rash to his left medial thigh. Psychiatric: Mood and affect are normal. Speech and behavior are normal.  ____________________________________________   LABS (all labs ordered are listed, but only abnormal results are displayed)  Labs Reviewed  COMPREHENSIVE METABOLIC PANEL - Abnormal; Notable for the following components:      Result Value   Glucose, Bld 140 (*)    Creatinine, Ser 1.27 (*)    Calcium 8.4 (*)    Total Protein 8.3 (*)    Albumin 3.2 (*)    AST 47 (*)    Total Bilirubin 1.6 (*)    All other components within normal limits  CBC WITH DIFFERENTIAL/PLATELET - Abnormal; Notable for the following components:   RBC 6.05 (*)    Hemoglobin 12.9 (*)    MCV 69.4 (*)    MCH 21.3 (*)    RDW 17.5 (*)    Neutro Abs 8.0 (*)    Lymphs Abs 0.5 (*)    All other components within normal limits  URINALYSIS, COMPLETE (UACMP) WITH MICROSCOPIC - Abnormal; Notable for the following components:   Color, Urine AMBER (*)    APPearance CLOUDY (*)    Hgb urine dipstick LARGE (*)    Protein, ur >=300 (*)    Bacteria, UA RARE (*)    All other components within normal limits  CULTURE, BLOOD (ROUTINE X 2)  CULTURE, BLOOD (ROUTINE X 2)  SARS CORONAVIRUS 2 BY RT PCR (HOSPITAL ORDER, PERFORMED IN [facility])  LACTIC ACID, PLASMA  PROTIME-INR   ____________________________________________  12 Lead EKG  Sinus rhythm, rate of 121 bpm, normal axis and intervals.  No evidence of acute ischemia. ____________________________________________  RADIOLOGY  ED MD interpretation: CXR without evidence of  acute cardiopulmonary pathology  Venous duplex of his bilateral lower extremities pending at the time of admission, agreed to be followed by admitting hospitalist  Official radiology report(s): DG Chest Port 1 View  Result Date: 11/24/2019 CLINICAL DATA:  54 year old male with sepsis. EXAM: PORTABLE CHEST 1 VIEW COMPARISON:  Chest radiograph dated 06/21/2017. FINDINGS: The heart size and mediastinal contours are within normal limits. Both lungs are clear. The visualized skeletal structures are unremarkable. IMPRESSION: No active disease. Electronically Signed   By: Elgie Collard M.D.   On: 11/24/2019 16:41   ____________________________________________   PROCEDURES and INTERVENTIONS  Procedure(s) performed (including Critical Care):  .1-3 Lead EKG Interpretation Performed by: Delton Prairie, MD Authorized by: Delton Prairie, MD     Interpretation: abnormal     ECG rate:  110   ECG rate assessment: tachycardic     Rhythm: sinus tachycardia     Ectopy: none     Conduction: normal    Ultrasound ED Peripheral IV (Provider)  Date/Time: 11/24/2019 7:51 PM Performed by: Delton Prairie, MD Authorized by: Delton Prairie, MD   Procedure details:    Indications: hydration, multiple failed IV attempts and poor IV access     Skin Prep: chlorhexidine gluconate     Location: right upper arm, basilic vein.  Angiocath:  20 G   Bedside Ultrasound Guided: Yes     Images: not archived     Patient tolerated procedure without complications: Yes     Dressing applied: Yes      Medications  ceFEPIme (MAXIPIME) 2 g in sodium chloride 0.9 % 100 mL IVPB (0 g Intravenous Stopped 11/24/19 1911)  vancomycin (VANCOREADY) IVPB 2000 mg/400 mL (2,000 mg Intravenous New Bag/Given 11/24/19 1912)  acetaminophen (TYLENOL) tablet 1,000 mg (1,000 mg Oral Given 11/24/19 1723)  lactated ringers bolus 1,000 mL (0 mLs Intravenous Paused 11/24/19 1815)    ____________________________________________   INITIAL  IMPRESSION / ASSESSMENT AND PLAN / ED COURSE  Obese 54 year old male presenting from home with evidence of sepsis due to left leg cellulitis and requiring medical admission.  Patient with low-grade fever, tachycardic, but hemodynamically stable and not hypoxic on room air.  Exam demonstrates an uncomfortable-appearing obese patient with grossly edematous bilateral lower extremities, and with area of cellulitis throughout his left medial thigh and knee.  This is likely the source of his vital signs derangements, as he has no evidence of additional acute infectious pathology on examination.  Blood work with AKI that appears to be prerenal in nature.  No significant leukocytosis or lactic acidosis.  Due to inability of nursing colleagues to place IV, I placed ultrasound-guided IV to his right upper arm and we initiate fluid resuscitation and antibiotic administration.  Blood cultures were drawn prior to this administration.  Venous ultrasound of his bilateral lower extremities pending at the time of admission to assess for possibility of DVT as the source of his swelling.  There is no evidence of Fournier's gangrene or deep space necrotizing infection at this time.  No evidence of compartment syndrome.  Will admit the patient to hospitalist medicine for further work-up and management.  Clinical Course as of Nov 23 1949  Fri Nov 24, 2019  1714 U/s guided IV placed by me. Blood drawn to send to lab, including cultures   [DS]  1949 Spoke admitting hospitalist who agrees admit the patient.  Informed her of IV blowing as nurses had just message me indicating that the IV I placed a few hours ago has now appear to infiltrate.  IV team consult placed   [DS]    Clinical Course User Index [DS] Delton Prairie, MD     ____________________________________________   FINAL CLINICAL IMPRESSION(S) / ED DIAGNOSES  Final diagnoses:  Cellulitis of left lower extremity  Sepsis without acute organ dysfunction, due to  unspecified organism [facility])  Sinus tachycardia     ED Discharge Orders    None       Odessie Polzin   Note:  This document was prepared using Dragon voice recognition software and may include unintentional dictation errors.   Delton Prairie, MD 11/24/19 [phone]

## 2019-11-24 NOTE — Consult Note (Signed)
[place] at bedside. Will return later to obtain PIV access

## 2019-11-24 NOTE — Progress Notes (Signed)
PIV attempted once, unsuccessful.Pt up in the chair in the waiting room. Asked nurse to placed order for PIV start when pt is in the bed. RN made aware.

## 2019-11-25 ENCOUNTER — Inpatient Hospital Stay: Payer: Self-pay

## 2019-11-25 DIAGNOSIS — B955 Unspecified streptococcus as the cause of diseases classified elsewhere: Secondary | ICD-10-CM

## 2019-11-25 DIAGNOSIS — R7881 Bacteremia: Secondary | ICD-10-CM

## 2019-11-25 LAB — BASIC METABOLIC PANEL
Anion gap: 14 (ref 5–15)
Anion gap: 14 (ref 5–15)
BUN: 15 mg/dL (ref 6–20)
BUN: 15 mg/dL (ref 6–20)
CO2: 23 mmol/L (ref 22–32)
CO2: 23 mmol/L (ref 22–32)
Calcium: 8.2 mg/dL — ABNORMAL LOW (ref 8.9–10.3)
Calcium: 7.9 mg/dL — ABNORMAL LOW (ref 8.9–10.3)
Chloride: 98 mmol/L (ref 98–111)
Chloride: 97 mmol/L — ABNORMAL LOW (ref 98–111)
Creatinine, Ser: 1.3 mg/dL — ABNORMAL HIGH (ref 0.61–1.24)
Creatinine, Ser: 1.23 mg/dL (ref 0.61–1.24)
GFR calc Af Amer: >60 mL/min (ref >60)
GFR calc Af Amer: >60 mL/min (ref >60)
GFR calc non Af Amer: >60 mL/min (ref >60)
GFR calc non Af Amer: >60 mL/min (ref >60)
Glucose, Bld: 124 mg/dL — ABNORMAL HIGH (ref 70–99)
Glucose, Bld: 124 mg/dL — ABNORMAL HIGH (ref 70–99)
Potassium: 3.2 mmol/L — ABNORMAL LOW (ref 3.5–5.1)
Potassium: 3 mmol/L — ABNORMAL LOW (ref 3.5–5.1)
Sodium: 135 mmol/L (ref 135–145)
Sodium: 134 mmol/L — ABNORMAL LOW (ref 135–145)

## 2019-11-25 LAB — BLOOD CULTURE ID PANEL (REFLEXED) - BCID2

## 2019-11-25 LAB — PROTIME-INR
INR: 1.2 (ref 0.8–1.2)
Prothrombin Time: 14.4 seconds (ref 11.4–15.2)

## 2019-11-25 LAB — LACTIC ACID, PLASMA
Lactic Acid, Venous: 2.5 mmol/L (ref 0.5–1.9)
Lactic Acid, Venous: 1.7 mmol/L (ref 0.5–1.9)

## 2019-11-25 LAB — CBC
HCT: 34.4 % — ABNORMAL LOW (ref 39.0–52.0)
Hemoglobin: 10.9 g/dL — ABNORMAL LOW (ref 13.0–17.0)
MCH: 21.6 pg — ABNORMAL LOW (ref 26.0–34.0)
MCHC: 31.7 g/dL (ref 30.0–36.0)
MCV: 68.3 fL — ABNORMAL LOW (ref 80.0–100.0)
Platelets: 159 10*3/uL (ref 150–400)
RBC: 5.04 MIL/uL (ref 4.22–5.81)
RDW: 16.7 % — ABNORMAL HIGH (ref 11.5–15.5)
WBC: 9.8 10*3/uL (ref 4.0–10.5)
nRBC: 0.2 % (ref 0.0–0.2)

## 2019-11-25 LAB — MRSA PCR SCREENING: MRSA by PCR: NEGATIVE

## 2019-11-25 LAB — FIBRIN DERIVATIVES D-DIMER (ARMC ONLY): Fibrin derivatives D-dimer (ARMC): 1759.59 ng/mL (FEU) — ABNORMAL HIGH (ref 0.00–499.00)

## 2019-11-25 LAB — GLUCOSE, CAPILLARY: Glucose-Capillary: 122 mg/dL — ABNORMAL HIGH (ref 70–99)

## 2019-11-25 LAB — MAGNESIUM: Magnesium: 2.1 mg/dL (ref 1.7–2.4)

## 2019-11-25 LAB — PROCALCITONIN: Procalcitonin: 4.86 ng/mL

## 2019-11-25 MED ORDER — SODIUM CHLORIDE 0.9 % IV SOLN
2.0000 g | INTRAVENOUS | Status: DC
  Administered 2019-11-25 – 2019-11-28 (×4): 2 g via INTRAVENOUS
  Filled 2019-11-25 (×2): qty 2
  Filled 2019-11-25: qty 20
  Filled 2019-11-25 (×2): qty 2

## 2019-11-25 MED ORDER — KCL-LACTATED RINGERS 20 MEQ/L IV SOLN
INTRAVENOUS | Status: DC
  Filled 2019-11-25 (×2): qty 1000

## 2019-11-25 MED ORDER — POTASSIUM CHLORIDE 2 MEQ/ML IV SOLN
INTRAVENOUS | Status: DC
  Filled 2019-11-25 (×5): qty 1000

## 2019-11-25 MED ORDER — LACTATED RINGERS IV BOLUS
1000.0000 mL | Freq: Once | INTRAVENOUS | Status: AC
  Administered 2019-11-25: 1000 mL via INTRAVENOUS

## 2019-11-25 NOTE — Progress Notes (Signed)
   11/25/19 1836  Assess: MEWS Score  Temp 99.7 F (37.6 C)  BP 109/73  Pulse Rate (!) 110  Resp (!) 24  SpO2 97 %  O2 Device Room Air  Assess: MEWS Score  MEWS Temp 0  MEWS Systolic 0  MEWS Pulse 1  MEWS RR 1  MEWS LOC 0  MEWS Score 2  MEWS Score Color Yellow  Assess: if the MEWS score is Yellow or Red  Were vital signs taken at a resting state? Yes  Focused Assessment No change from prior assessment  MEWS guidelines implemented *See Row Information* No, previously yellow, continue vital signs every 4 hours

## 2019-11-25 NOTE — Progress Notes (Signed)
[facility] paged for rapid response; medical team attending to pt. when [facility] arrived.  No family/visitors present and no immediate needs evident.  [facility] remains available.

## 2019-11-25 NOTE — Progress Notes (Signed)
CRITICAL VALUE ALERT  Critical Value:  Lactic acid 2.5  Date & Time Notied:  1512  Provider Notified: Ayiku MD  Orders Received/Actions taken: MD to place orders

## 2019-11-25 NOTE — Progress Notes (Signed)
PHARMACY - PHYSICIAN COMMUNICATION CRITICAL VALUE ALERT - BLOOD CULTURE IDENTIFICATION (BCID)  Alfred [name] is an 54 y.o. male who presented to [facility] on 11/24/2019 with a chief complaint of cellulitis  Assessment:  Lab reports 2/4 bottles w/ strep species  Name of physician (or Provider) Contacted: Reyes Ivan NP  Current antibiotics: Zosyn and Vancomycin  Changes to prescribed antibiotics recommended: d/c zosyn and add Rocephin 2gm IV q24hrs Recommendations accepted by provider  Results for orders placed or performed during the hospital encounter of 11/24/19  Blood Culture ID Panel (Reflexed) (Collected: 11/24/2019  5:20 PM)  Result Value Ref Range   Enterococcus faecalis NOT DETECTED NOT DETECTED   Enterococcus Faecium NOT DETECTED NOT DETECTED   Listeria monocytogenes NOT DETECTED NOT DETECTED   Staphylococcus species NOT DETECTED NOT DETECTED   Staphylococcus aureus (BCID) NOT DETECTED NOT DETECTED   Staphylococcus epidermidis NOT DETECTED NOT DETECTED   Staphylococcus lugdunensis NOT DETECTED NOT DETECTED   Streptococcus species DETECTED (A) NOT DETECTED   Streptococcus agalactiae NOT DETECTED NOT DETECTED   Streptococcus pneumoniae NOT DETECTED NOT DETECTED   Streptococcus pyogenes NOT DETECTED NOT DETECTED   A.calcoaceticus-baumannii NOT DETECTED NOT DETECTED   Bacteroides fragilis NOT DETECTED NOT DETECTED   Enterobacterales NOT DETECTED NOT DETECTED   Enterobacter cloacae complex NOT DETECTED NOT DETECTED   Escherichia coli NOT DETECTED NOT DETECTED   Klebsiella aerogenes NOT DETECTED NOT DETECTED   Klebsiella oxytoca NOT DETECTED NOT DETECTED   Klebsiella pneumoniae NOT DETECTED NOT DETECTED   Proteus species NOT DETECTED NOT DETECTED   Salmonella species NOT DETECTED NOT DETECTED   Serratia marcescens NOT DETECTED NOT DETECTED   Haemophilus influenzae NOT DETECTED NOT DETECTED   Neisseria meningitidis NOT DETECTED NOT DETECTED   Pseudomonas aeruginosa NOT  DETECTED NOT DETECTED   Stenotrophomonas maltophilia NOT DETECTED NOT DETECTED   Candida albicans NOT DETECTED NOT DETECTED   Candida auris NOT DETECTED NOT DETECTED   Candida glabrata NOT DETECTED NOT DETECTED   Candida krusei NOT DETECTED NOT DETECTED   Candida parapsilosis NOT DETECTED NOT DETECTED   Candida tropicalis NOT DETECTED NOT DETECTED   Cryptococcus neoformans/gattii NOT DETECTED NOT DETECTED    Valrie Hart A 11/25/2019  5:44 AM

## 2019-11-25 NOTE — Progress Notes (Addendum)
Progress Note    Alfred [name]  PRF:[id number] DOB: 19-Jul-1965  DOA: 11/24/2019 PCP: Patient, No Pcp Per      Brief Narrative:    Medical records reviewed and are as summarized below:  Alfred [name] is a 53 y.o. male       Assessment/Plan:   Principal Problem:   Streptococcal sepsis, unspecified (HCC) Active Problems:   Cellulitis of left leg   AKI (acute kidney injury) (HCC)   Streptococcal bacteremia   Severe strep sepsis (fever, tachycardia, AKI) and bacteremia secondary to left leg cellulitis AKI (organ dysfunction) Debility/inability to walk  PLAN  Continue empiric IV antibiotics Follow-up blood culture sensitivity report Analgesics as needed for pain Monitor BMP Consult PT    Body mass index is 28.16 kg/m.  Diet Order            Diet regular Room service appropriate? Yes; Fluid consistency: Thin  Diet effective now                      Medications:   . enoxaparin (LOVENOX) injection  40 mg Subcutaneous Q24H   Continuous Infusions: . sodium chloride 100 mL/hr at 11/24/19 2239  . cefTRIAXone (ROCEPHIN)  IV 2 g (11/25/19 0900)  . sodium chloride    . vancomycin 750 mg (11/25/19 0520)     Anti-infectives (From admission, onward)   Start     Dose/Rate Route Frequency Ordered Stop   11/25/19 0800  cefTRIAXone (ROCEPHIN) 2 g in sodium chloride 0.9 % 100 mL IVPB        2 g 200 mL/hr over 30 Minutes Intravenous Every 24 hours 11/25/19 0544     11/25/19 0600  vancomycin (VANCOREADY) IVPB 750 mg/150 mL        750 mg 150 mL/hr over 60 Minutes Intravenous Every 12 hours 11/24/19 2029     11/24/19 2200  piperacillin-tazobactam (ZOSYN) IVPB 3.375 g  Status:  Discontinued        3.375 g 12.5 mL/hr over 240 Minutes Intravenous Every 8 hours 11/24/19 2026 11/25/19 0543   11/24/19 2015  piperacillin-tazobactam (ZOSYN) IVPB 3.375 g  Status:  Discontinued        3.375 g 100 mL/hr over 30 Minutes Intravenous  Once 11/24/19 2004  11/24/19 2026   11/24/19 2015  vancomycin (VANCOCIN) IVPB 1000 mg/200 mL premix  Status:  Discontinued        1,000 mg 200 mL/hr over 60 Minutes Intravenous  Once 11/24/19 2004 11/24/19 2025   11/24/19 1845  vancomycin (VANCOREADY) IVPB 2000 mg/400 mL        2,000 mg 200 mL/hr over 120 Minutes Intravenous  Once 11/24/19 1748 11/25/19 0130   11/24/19 1745  ceFEPIme (MAXIPIME) 2 g in sodium chloride 0.9 % 100 mL IVPB  Status:  Discontinued        2 g 200 mL/hr over 30 Minutes Intravenous Every 8 hours 11/24/19 1732 11/24/19 2025             Family Communication/Anticipated D/C date and plan/Code Status   DVT prophylaxis: enoxaparin (LOVENOX) injection 40 mg Start: 11/24/19 2200     Code Status: Full Code  Family Communication: Plan discussed with patient Disposition Plan:    Status is: Inpatient  Remains inpatient appropriate because:IV treatments appropriate due to intensity of illness or inability to take PO   Dispo: The patient is from: Home  Anticipated d/c is to: Home              Anticipated d/c date is: 3 days              Patient currently is not medically stable to d/c.           Subjective:   C/o severe pain in the left leg.  No fever or chills.  No shortness of breath or chest pain.  Objective:    Vitals:   11/25/19 0523 11/25/19 0622 11/25/19 1019 11/25/19 1340  BP: 138/64 (!) 155/72 (!) 158/75 134/71  Pulse: (!) 117 (!) 117 (!) 119 (!) 108  Resp: 16 20 20 19   Temp: (!) 101.4 F (38.6 C) 100 F (37.8 C) 99.8 F (37.7 C) 99.4 F (37.4 C)  TempSrc: Oral Oral Oral Oral  SpO2: 98% 98% 96% 95%  Weight:      Height:       No data found.   Intake/Output Summary (Last 24 hours) at 11/25/2019 1430 Last data filed at 11/25/2019 1101 Gross per 24 hour  Intake 200 ml  Output 1100 ml  Net -900 ml   Filed Weights   11/24/19 1249  Weight: 113.4 kg    Exam:  GEN: NAD SKIN: Multiple pustular appearing lesions on the left  leg.  Open wound with some yellowish drainage near the left medial malleolus.  Multiple small superficial ulcerations on the right leg EYES: EOMI ENT: MMM CV: RRR PULM: CTA B ABD: soft, obese, NT, +BS CNS: AAO x 3, non focal EXT: Swelling and tenderness of left leg.               Data Reviewed:   I have personally reviewed following labs and imaging studies:  Labs: Labs show the following:   Basic Metabolic Panel: Recent Labs  Lab 11/24/19 1720  NA 136  K 4.1  CL 99  CO2 26  GLUCOSE 140*  BUN 15  CREATININE 1.27*  CALCIUM 8.4*   GFR Estimated Creatinine Clearance: 96.7 mL/min (A) (by C-G formula based on SCr of 1.27 mg/dL (H)). Liver Function Tests: Recent Labs  Lab 11/24/19 1720  AST 47*  ALT 41  ALKPHOS 58  BILITOT 1.6*  PROT 8.3*  ALBUMIN 3.2*   No results for input(s): LIPASE, AMYLASE in the last 168 hours. No results for input(s): AMMONIA in the last 168 hours. Coagulation profile Recent Labs  Lab 11/24/19 1720  INR 1.0    CBC: Recent Labs  Lab 11/24/19 1720  WBC 9.1  NEUTROABS 8.0*  HGB 12.9*  HCT 42.0  MCV 69.4*  PLT 185   Cardiac Enzymes: No results for input(s): CKTOTAL, CKMB, CKMBINDEX, TROPONINI in the last 168 hours. BNP (last 3 results) No results for input(s): PROBNP in the last 8760 hours. CBG: No results for input(s): GLUCAP in the last 168 hours. D-Dimer: No results for input(s): DDIMER in the last 72 hours. Hgb A1c: No results for input(s): HGBA1C in the last 72 hours. Lipid Profile: No results for input(s): CHOL, HDL, LDLCALC, TRIG, CHOLHDL, LDLDIRECT in the last 72 hours. Thyroid function studies: No results for input(s): TSH, T4TOTAL, T3FREE, THYROIDAB in the last 72 hours.  Invalid input(s): FREET3 Anemia work up: No results for input(s): VITAMINB12, FOLATE, FERRITIN, TIBC, IRON, RETICCTPCT in the last 72 hours. Sepsis Labs: Recent Labs  Lab 11/24/19 1720  WBC 9.1  LATICACIDVEN 1.9     Microbiology Recent Results (from the past 240 hour(s))  Culture, blood (  Routine x 2)     Status: None (Preliminary result)   Collection Time: 11/24/19  3:14 PM   Specimen: BLOOD  Result Value Ref Range Status   Specimen Description BLOOD BLOOD RIGHT HAND  Final   Special Requests   Final    BOTTLES DRAWN AEROBIC AND ANAEROBIC Blood Culture results may not be optimal due to an inadequate volume of blood received in culture bottles   Culture   Final    NO GROWTH < 24 HOURS Performed at [facility], [address] [place], [place] [address]    Report Status PENDING  Incomplete  Culture, blood (Routine x 2)     Status: None (Preliminary result)   Collection Time: 11/24/19  5:20 PM   Specimen: BLOOD  Result Value Ref Range Status   Specimen Description BLOOD BLOOD RIGHT ARM  Final   Special Requests   Final    BOTTLES DRAWN AEROBIC AND ANAEROBIC Blood Culture adequate volume   Culture  Setup Time   Final    Organism ID to follow GRAM POSITIVE COCCI IN BOTH AEROBIC AND ANAEROBIC BOTTLES CRITICAL RESULT CALLED TO, READ BACK BY AND VERIFIED WITH: SCOTT HALL 11/25/19 AT 0530 HS Performed at [facility], [address] [place], [place] [address]    Culture GRAM POSITIVE COCCI  Final   Report Status PENDING  Incomplete  Blood Culture ID Panel (Reflexed)     Status: Abnormal   Collection Time: 11/24/19  5:20 PM  Result Value Ref Range Status   Enterococcus faecalis NOT DETECTED NOT DETECTED Final   Enterococcus Faecium NOT DETECTED NOT DETECTED Final   Listeria monocytogenes NOT DETECTED NOT DETECTED Final   Staphylococcus species NOT DETECTED NOT DETECTED Final   Staphylococcus aureus (BCID) NOT DETECTED NOT DETECTED Final   Staphylococcus epidermidis NOT DETECTED NOT DETECTED Final   Staphylococcus lugdunensis NOT DETECTED NOT DETECTED Final   Streptococcus species DETECTED (A) NOT DETECTED Final    Comment: Not Enterococcus species, Streptococcus  agalactiae, Streptococcus pyogenes, or Streptococcus pneumoniae. CRITICAL RESULT CALLED TO, READ BACK BY AND VERIFIED WITH: SCOTT HALL 11/25/19 AT 0530 HS    Streptococcus agalactiae NOT DETECTED NOT DETECTED Final   Streptococcus pneumoniae NOT DETECTED NOT DETECTED Final   Streptococcus pyogenes NOT DETECTED NOT DETECTED Final   A.calcoaceticus-baumannii NOT DETECTED NOT DETECTED Final   Bacteroides fragilis NOT DETECTED NOT DETECTED Final   Enterobacterales NOT DETECTED NOT DETECTED Final   Enterobacter cloacae complex NOT DETECTED NOT DETECTED Final   Escherichia coli NOT DETECTED NOT DETECTED Final   Klebsiella aerogenes NOT DETECTED NOT DETECTED Final   Klebsiella oxytoca NOT DETECTED NOT DETECTED Final   Klebsiella pneumoniae NOT DETECTED NOT DETECTED Final   Proteus species NOT DETECTED NOT DETECTED Final   Salmonella species NOT DETECTED NOT DETECTED Final   Serratia marcescens NOT DETECTED NOT DETECTED Final   Haemophilus influenzae NOT DETECTED NOT DETECTED Final   Neisseria meningitidis NOT DETECTED NOT DETECTED Final   Pseudomonas aeruginosa NOT DETECTED NOT DETECTED Final   Stenotrophomonas maltophilia NOT DETECTED NOT DETECTED Final   Candida albicans NOT DETECTED NOT DETECTED Final   Candida auris NOT DETECTED NOT DETECTED Final   Candida glabrata NOT DETECTED NOT DETECTED Final   Candida krusei NOT DETECTED NOT DETECTED Final   Candida parapsilosis NOT DETECTED NOT DETECTED Final   Candida tropicalis NOT DETECTED NOT DETECTED Final   Cryptococcus neoformans/gattii NOT DETECTED NOT DETECTED Final    Comment: Performed at [facility],  [address] [place], [place] [address]  SARS Coronavirus 2 by RT PCR (hospital order, performed in [facility] hospital lab) Nasopharyngeal Nasopharyngeal Swab     Status: None   Collection Time: 11/24/19  7:40 PM   Specimen: Nasopharyngeal Swab  Result Value Ref Range Status   SARS Coronavirus 2 NEGATIVE NEGATIVE Final     Comment: (NOTE) SARS-CoV-2 target nucleic acids are NOT DETECTED.  The SARS-CoV-2 RNA is generally detectable in upper and lower respiratory specimens during the acute phase of infection. The lowest concentration of SARS-CoV-2 viral copies this assay can detect is 250 copies / mL. A negative result does not preclude SARS-CoV-2 infection and should not be used as the sole basis for treatment or other patient management decisions.  A negative result may occur with improper specimen collection / handling, submission of specimen other than nasopharyngeal swab, presence of viral mutation(s) within the areas targeted by this assay, and inadequate number of viral copies (<250 copies / mL). A negative result must be combined with clinical observations, patient history, and epidemiological information.  Fact Sheet for Patients:   BoilerBrush.com.cy  Fact Sheet for Healthcare Providers: https://pope.com/  This test is not yet approved or  cleared by the [place] FDA and has been authorized for detection and/or diagnosis of SARS-CoV-2 by FDA under an Emergency Use Authorization (EUA).  This EUA will remain in effect (meaning this test can be used) for the duration of the COVID-19 declaration under Section 564(b)(1) of the Act, 21 U.S.C. section 360bbb-3(b)(1), unless the authorization is terminated or revoked sooner.  Performed at [facility], [address] [place], [place] [address]     Procedures and diagnostic studies:  US Venous Img Lower Bilateral  Result Date: 11/24/2019 CLINICAL DATA:  Swelling, pain and discoloration the bilateral lower extremities EXAM: BILATERAL LOWER EXTREMITY VENOUS DOPPLER ULTRASOUND TECHNIQUE: Gray-scale sonography with compression, as well as color and duplex ultrasound, were performed to evaluate the deep venous system(s) from the level of the common femoral vein through the popliteal and  proximal calf veins. COMPARISON:  None FINDINGS: VENOUS Markedly suboptimal assessment bilateral lower extremities due to patient intolerance and pain with scanning procedure particularly at the level of the common femoral, saphenofemoral junctions and profundus femoral veins. Normal color flow and response to augmentation though waveforms are significantly degraded and compressibility is incompletely assessed. Normal color flow, compressibility and response to augmentation of the bilateral femoral and popliteal veins. Additional limitations with the bilateral calf veins poorly visualized due to patient body habitus and severe lower extremity soft tissue edema seen on grayscale color Doppler ultrasound. OTHER Multiple prominent lymph nodes present in the bilateral groins, largest in the groin measuring up to 2.4 cm short axis albeit with preserved nodal architecture and thickened cortices favoring reactive or edematous change. Limitations: Body habitus and inability to tolerate compression. IMPRESSION: Suboptimal evaluation with imaging limitations as detailed above. Incomplete assessment of the bilateral common femoral, saphenofemoral profundus femoral veins due to lack of compression imaging. Calf veins are poorly visualized as well due to edema and body habitus. No demonstrable femoropopliteal DVT. If clinical symptoms are inconsistent or if there are persistent or worsening symptoms, further imaging (possibly involving the iliac veins) may be warranted. Electronically Signed   By: Kreg Shropshire M.D.   On: 11/24/2019 21:09   DG Chest Port 1 View  Result Date: 11/24/2019 CLINICAL DATA:  54 year old male with sepsis. EXAM: PORTABLE CHEST 1 VIEW COMPARISON:  Chest radiograph dated 06/21/2017. FINDINGS:  The heart size and mediastinal contours are within normal limits. Both lungs are clear. The visualized skeletal structures are unremarkable. IMPRESSION: No active disease. Electronically Signed   By: Elgie CollardArash   Radparvar M.D.   On: 11/24/2019 16:41               LOS: 1 day   Deryn Massengale  [facility]   Pager on [email or web address]. If 7PM-7AM, please contact night-coverage at [email or web address]     11/25/2019, 2:30 PM

## 2019-11-25 NOTE — Progress Notes (Signed)
°   11/25/19 1900  Assess: MEWS Score  Temp (!) 102 F (38.9 C)  BP 104/62  Pulse Rate (!) 146  Resp (!) 32  Level of Consciousness Alert  SpO2 100 %  O2 Device Room Air  Patient Activity (if Appropriate) In bed  Assess: MEWS Score  MEWS Temp 2  MEWS Systolic 0  MEWS Pulse 3  MEWS RR 2  MEWS LOC 0  MEWS Score 7  MEWS Score Color Red  Assess: if the MEWS score is Yellow or Red  Were vital signs taken at a resting state? Yes  Focused Assessment Change from prior assessment (see assessment flowsheet)  Early Detection of Sepsis Score *See Row Information* High  MEWS guidelines implemented *See Row Information* Yes  Treat  MEWS Interventions Escalated (See documentation below);Other (Comment) (Rapid response called)  Pain Scale 0-10  Pain Score 0  Take Vital Signs  Increase Vital Sign Frequency  Red: Q 1hr X 4 then Q 4hr X 4, if remains red, continue Q 4hrs  Escalate  MEWS: Escalate Red: discuss with charge nurse/RN and provider, consider discussing with RRT  Notify: Charge Nurse/RN  Name of Charge Nurse/RN Notified Olivia, RN  Date Charge Nurse/RN Notified 11/25/19  Time Charge Nurse/RN Notified 1900  Notify: Provider  Provider Name/Title Webb Silversmith, NP  Date Provider Notified 11/25/19  Time Provider Notified 1900  Notification Type Page  Notification Reason Change in status  Response See new orders  Date of Provider Response 11/25/19  Time of Provider Response 1900  Notify: Rapid Response  Name of Rapid Response RN Notified Annabelle Harman, RN  Date Rapid Response Notified 11/25/19  Time Rapid Response Notified 1900  Document  Patient Outcome Other (Comment) (Continuing interventions and endorsed to oncoming RN.)  Progress note created (see row info) Yes   Rapid Response called due to sudden change in patient status and escalation of MEWS.  Orders placed by provider and followed.

## 2019-11-25 NOTE — Progress Notes (Signed)
   11/25/19 1942  Assess: MEWS Score  Temp (!) 102.4 F (39.1 C)  BP 112/87  Pulse Rate (!) 165  Resp (!) 32  Level of Consciousness Alert  SpO2 100 %  O2 Device Room Air  Assess: MEWS Score  MEWS Temp 2  MEWS Systolic 0  MEWS Pulse 3  MEWS RR 2  MEWS LOC 0  MEWS Score 7  MEWS Score Color Red  Assess: if the MEWS score is Yellow or Red  Were vital signs taken at a resting state? Yes  Focused Assessment Change from prior assessment (see assessment flowsheet)  Early Detection of Sepsis Score *See Row Information* High  MEWS guidelines implemented *See Row Information* Yes  Treat  Pain Scale 0-10  Pain Score 7  Pain Type Acute pain  Pain Location Leg  Pain Orientation Right;Left  Pain Descriptors / Indicators Aching  Pain Frequency Constant  Pain Onset On-going  Patients Stated Pain Goal 2  Pain Intervention(s) Medication (See eMAR)  Take Vital Signs  Increase Vital Sign Frequency  Red: Q 1hr X 4 then Q 4hr X 4, if remains red, continue Q 4hrs  Escalate  MEWS: Escalate Red: discuss with charge nurse/RN and provider, consider discussing with RRT  Notify: Charge Nurse/RN  Name of Charge Nurse/RN Notified Crystal  Date Charge Nurse/RN Notified 11/25/19  Time Charge Nurse/RN Notified 1940  Notify: Provider  Provider Name/Title Webb Silversmith NP  Date Provider Notified 11/25/19  Time Provider Notified 1940  Notification Type Page  Notification Reason Change in status  Response See new orders  Date of Provider Response 11/25/19  Time of Provider Response 1941  Notify: Rapid Response  Name of Rapid Response RN Notified Annabelle Harman RN  Date Rapid Response Notified 11/25/19  Time Rapid Response Notified 1940  Document  Patient Outcome Not stable and remains on department  Progress note created (see row info) Yes  Ongoing  Rapid response call during change of shift. MEWS - 7 due to elevated RR, HR and temp. IV bolus ongoing. Secured second IV line per IV team. Additional labs  ordered. Tele monitoring started.  Reviewed and assessed with NP Ouma, pt complaining of severe B leg pain, IV morphine given with good response. Heart murmur detected - CXR ordered and completed. Cooling blanket restarted, will give Tylenol when due. Extra labs added. Will continue hourly VS for now.

## 2019-11-26 ENCOUNTER — Inpatient Hospital Stay: Payer: Self-pay

## 2019-11-26 ENCOUNTER — Other Ambulatory Visit: Payer: Self-pay

## 2019-11-26 ENCOUNTER — Encounter: Payer: Self-pay | Admitting: Internal Medicine

## 2019-11-26 LAB — CBC WITH DIFFERENTIAL/PLATELET
Abs Immature Granulocytes: 0.14 10*3/uL — ABNORMAL HIGH (ref 0.00–0.07)
Basophils Absolute: 0 10*3/uL (ref 0.0–0.1)
Basophils Relative: 0 %
Eosinophils Absolute: 0.1 10*3/uL (ref 0.0–0.5)
Eosinophils Relative: 1 %
HCT: 33.1 % — ABNORMAL LOW (ref 39.0–52.0)
Hemoglobin: 10.2 g/dL — ABNORMAL LOW (ref 13.0–17.0)
Immature Granulocytes: 2 %
Lymphocytes Relative: 6 %
Lymphs Abs: 0.5 10*3/uL — ABNORMAL LOW (ref 0.7–4.0)
MCH: 21.2 pg — ABNORMAL LOW (ref 26.0–34.0)
MCHC: 30.8 g/dL (ref 30.0–36.0)
MCV: 68.8 fL — ABNORMAL LOW (ref 80.0–100.0)
Monocytes Absolute: 0.6 10*3/uL (ref 0.1–1.0)
Monocytes Relative: 7 %
Neutro Abs: 7.3 10*3/uL (ref 1.7–7.7)
Neutrophils Relative %: 84 %
Platelets: 142 10*3/uL — ABNORMAL LOW (ref 150–400)
RBC: 4.81 MIL/uL (ref 4.22–5.81)
RDW: 16.4 % — ABNORMAL HIGH (ref 11.5–15.5)
Smear Review: NORMAL
WBC: 8.6 10*3/uL (ref 4.0–10.5)
nRBC: 0.2 % (ref 0.0–0.2)

## 2019-11-26 LAB — BASIC METABOLIC PANEL
Anion gap: 14 (ref 5–15)
BUN: 15 mg/dL (ref 6–20)
CO2: 18 mmol/L — ABNORMAL LOW (ref 22–32)
Calcium: 8.2 mg/dL — ABNORMAL LOW (ref 8.9–10.3)
Chloride: 102 mmol/L (ref 98–111)
Creatinine, Ser: 1.1 mg/dL (ref 0.61–1.24)
GFR calc Af Amer: >60 mL/min (ref >60)
GFR calc non Af Amer: >60 mL/min (ref >60)
Glucose, Bld: 109 mg/dL — ABNORMAL HIGH (ref 70–99)
Potassium: 3.4 mmol/L — ABNORMAL LOW (ref 3.5–5.1)
Sodium: 134 mmol/L — ABNORMAL LOW (ref 135–145)

## 2019-11-26 LAB — CORTISOL-AM, BLOOD: Cortisol - AM: 25.4 ug/dL — ABNORMAL HIGH (ref 6.7–22.6)

## 2019-11-26 LAB — C DIFFICILE QUICK SCREEN W PCR REFLEX
C Diff antigen: NEGATIVE
C Diff interpretation: NOT DETECTED
C Diff toxin: NEGATIVE

## 2019-11-26 LAB — LACTIC ACID, PLASMA: Lactic Acid, Venous: 1.7 mmol/L (ref 0.5–1.9)

## 2019-11-26 LAB — TROPONIN I (HIGH SENSITIVITY)
Troponin I (High Sensitivity): 27 ng/L — ABNORMAL HIGH (ref <18)
Troponin I (High Sensitivity): 27 ng/L — ABNORMAL HIGH (ref <18)

## 2019-11-26 LAB — MAGNESIUM: Magnesium: 2.5 mg/dL — ABNORMAL HIGH (ref 1.7–2.4)

## 2019-11-26 LAB — GLUCOSE, CAPILLARY: Glucose-Capillary: 103 mg/dL — ABNORMAL HIGH (ref 70–99)

## 2019-11-26 LAB — PHOSPHORUS: Phosphorus: 2.3 mg/dL — ABNORMAL LOW (ref 2.5–4.6)

## 2019-11-26 LAB — HIV ANTIBODY (ROUTINE TESTING W REFLEX): HIV Screen 4th Generation wRfx: NONREACTIVE

## 2019-11-26 MED ORDER — METOPROLOL TARTRATE 5 MG/5ML IV SOLN
5.0000 mg | Freq: Once | INTRAVENOUS | Status: AC
  Administered 2019-11-26: 5 mg via INTRAVENOUS

## 2019-11-26 MED ORDER — DILTIAZEM LOAD VIA INFUSION
15.0000 mg | Freq: Once | INTRAVENOUS | Status: AC
  Administered 2019-11-26: 15 mg via INTRAVENOUS
  Filled 2019-11-26: qty 15

## 2019-11-26 MED ORDER — POTASSIUM CHLORIDE CRYS ER 20 MEQ PO TBCR
40.0000 meq | EXTENDED_RELEASE_TABLET | Freq: Once | ORAL | Status: AC
  Administered 2019-11-26: 40 meq via ORAL
  Filled 2019-11-26: qty 2

## 2019-11-26 MED ORDER — DILTIAZEM HCL-DEXTROSE 125-5 MG/125ML-% IV SOLN (PREMIX)
5.0000 mg/h | INTRAVENOUS | Status: DC
  Administered 2019-11-26 – 2019-11-27 (×2): 5 mg/h via INTRAVENOUS
  Administered 2019-11-27: 10 mg/h via INTRAVENOUS
  Filled 2019-11-26 (×3): qty 125

## 2019-11-26 MED ORDER — DILTIAZEM HCL 25 MG/5ML IV SOLN
10.0000 mg | Freq: Once | INTRAVENOUS | Status: AC
  Administered 2019-11-26: 10 mg via INTRAVENOUS
  Filled 2019-11-26: qty 5

## 2019-11-26 MED ORDER — CALCIUM GLUCONATE-NACL 1-0.675 GM/50ML-% IV SOLN
1.0000 g | Freq: Once | INTRAVENOUS | Status: AC
  Administered 2019-11-26: 1000 mg via INTRAVENOUS
  Filled 2019-11-26: qty 50

## 2019-11-26 MED ORDER — POTASSIUM CHLORIDE 10 MEQ/100ML IV SOLN
10.0000 meq | INTRAVENOUS | Status: AC
  Administered 2019-11-26 (×2): 10 meq via INTRAVENOUS
  Filled 2019-11-26 (×2): qty 100

## 2019-11-26 MED ORDER — AMIODARONE LOAD VIA INFUSION
150.0000 mg | Freq: Once | INTRAVENOUS | Status: AC
  Administered 2019-11-27: 150 mg via INTRAVENOUS
  Filled 2019-11-26: qty 83.34

## 2019-11-26 MED ORDER — AMIODARONE HCL IN DEXTROSE 360-4.14 MG/200ML-% IV SOLN
30.0000 mg/h | INTRAVENOUS | Status: DC
  Administered 2019-11-27 – 2019-12-01 (×9): 30 mg/h via INTRAVENOUS
  Filled 2019-11-26 (×9): qty 200

## 2019-11-26 MED ORDER — METOPROLOL TARTRATE 25 MG PO TABS
25.0000 mg | ORAL_TABLET | Freq: Two times a day (BID) | ORAL | Status: DC
  Administered 2019-11-26 – 2019-11-27 (×3): 25 mg via ORAL
  Filled 2019-11-26 (×4): qty 1

## 2019-11-26 MED ORDER — AMIODARONE HCL IN DEXTROSE 360-4.14 MG/200ML-% IV SOLN
60.0000 mg/h | INTRAVENOUS | Status: AC
  Administered 2019-11-27: 60 mg/h via INTRAVENOUS
  Filled 2019-11-26 (×2): qty 200

## 2019-11-26 MED ORDER — POTASSIUM CHLORIDE 2 MEQ/ML IV SOLN
INTRAVENOUS | Status: DC
  Filled 2019-11-26 (×7): qty 1000

## 2019-11-26 MED ORDER — IOHEXOL 350 MG/ML SOLN
100.0000 mL | Freq: Once | INTRAVENOUS | Status: AC | PRN
  Administered 2019-11-26: 100 mL via INTRAVENOUS

## 2019-11-26 MED ORDER — CHLORHEXIDINE GLUCONATE CLOTH 2 % EX PADS
6.0000 | MEDICATED_PAD | Freq: Every day | CUTANEOUS | Status: DC
  Administered 2019-11-26 – 2019-12-11 (×15): 6 via TOPICAL

## 2019-11-26 MED ORDER — METOPROLOL TARTRATE 5 MG/5ML IV SOLN
INTRAVENOUS | Status: AC
  Filled 2019-11-26: qty 5

## 2019-11-26 MED ORDER — IBUPROFEN 400 MG PO TABS
400.0000 mg | ORAL_TABLET | Freq: Four times a day (QID) | ORAL | Status: AC | PRN
  Administered 2019-11-26: 400 mg via ORAL
  Filled 2019-11-26: qty 1

## 2019-11-26 NOTE — Plan of Care (Signed)
Continuing with plan of care. 

## 2019-11-26 NOTE — Progress Notes (Signed)
   11/26/19 1128  Assess: MEWS Score  Temp (!) 102.3 F (39.1 C)  BP (!) 128/56  Pulse Rate (!) 161  Resp (!) 32  Level of Consciousness Alert  SpO2 98 %  O2 Device Room Air  Patient Activity (if Appropriate) In bed  Assess: MEWS Score  MEWS Temp 2  MEWS Systolic 0  MEWS Pulse 3  MEWS RR 2  MEWS LOC 0  MEWS Score 7  MEWS Score Color Red  Assess: if the MEWS score is Yellow or Red  Were vital signs taken at a resting state? Yes  Focused Assessment Change from prior assessment (see assessment flowsheet)  Early Detection of Sepsis Score *See Row Information* High  MEWS guidelines implemented *See Row Information* Yes  Treat  MEWS Interventions Escalated (See documentation below);Administered prn meds/treatments  Pain Scale 0-10  Pain Score 0  Take Vital Signs  Increase Vital Sign Frequency  Red: Q 1hr X 4 then Q 4hr X 4, if remains red, continue Q 4hrs  Escalate  MEWS: Escalate Red: discuss with charge nurse/RN and provider, consider discussing with RRT  Notify: Charge Nurse/RN  Name of Charge Nurse/RN Notified Ree Kida, RN  Date Charge Nurse/RN Notified 11/26/19  Time Charge Nurse/RN Notified 1128  Notify: Provider  Provider Name/Title Dr. Myriam Forehand  Date Provider Notified 11/26/19  Time Provider Notified 1130  Notification Type Page  Notification Reason Change in status  Response No new orders  Notify: Rapid Response  Name of Rapid Response RN Notified Katy, RN  Date Rapid Response Notified 11/26/19  Time Rapid Response Notified 1139  Document  Patient Outcome Not stable and remains on department  Progress note created (see row info) Yes

## 2019-11-26 NOTE — Progress Notes (Signed)
   11/26/19 0644  Vitals  Temp (!) 100.5 F (38.1 C)  Temp Source Oral  BP 112/68  MAP (mmHg) 81  BP Location Left Arm  BP Method Automatic  Patient Position (if appropriate) Lying  Pulse Rate (!) 156  Resp (!) 25  MEWS COLOR  MEWS Score Color Red  Oxygen Therapy  SpO2 97 %  O2 Device Room Air  MEWS Score  MEWS Temp 1  MEWS Systolic 0  MEWS Pulse 3  MEWS RR 1  MEWS LOC 0  MEWS Score 5   Charge RN updated on MEWS score - RED due to sustained tachycardia >150. Electrolytes corrected. NP aware, no further orders for now.

## 2019-11-26 NOTE — Progress Notes (Signed)
Rapid A. fib -Started on diltiazem bolus followed by infusion earlier in the night around 8 PM -Poor control on diltiazem bolus after titration up to 15 mg/h -Amiodarone bolus and infusion initiated at around midnight and weaning off Cardizem infusion -Consider cardiology consult in the a.m.

## 2019-11-26 NOTE — Progress Notes (Signed)
Patient's temperature throughout the shift fluctuated between 99-102 with a HR that continued in the upper 150s and lower 160s respirations in the 30s.  Dr. Myriam Forehand notified of the abnormal vital signs throughout the shift.  At approximately 11:20am the patient's temp was 101.3 with a B/P 137/114 HR 163 RR 36 and oxygen saturation of 95%, Dr. Myriam Forehand notified and patient received prn Tylenol.  Recheck of B/P 128/56 HR in the 160s, and temp 102.3  Unit charge nurse aware and notified ICU charge nurse who also assessed the patient.  Interventions to bring the patients temperature down implement with cooling blanket, ice, and cooling the room.  Reinstated the MEWS protocol.  Patients HR continues in the 160s with a elevated temperature and respirations and Dr. Myriam Forehand aware.  At 4pm the patient's B/P back up to 158/110 HR 160 RR 24 and temp 100.1, unit charge nurse notified who encourage this nurse to reach out to the nursing supervisor for guidance.  ICU charge nurse notified and came to assess patient.  Dr. Myriam Forehand notified of vital signs.  Metoprolol po ordered and administered.  Patient transferred to ICU 1, accompanied patient during transport to unit and report given to receiving nurse Barbara Cower.

## 2019-11-26 NOTE — Progress Notes (Addendum)
Progress Note    Alfred [name]  ZOX:[id number] DOB: 1965/04/10  DOA: 11/24/2019 PCP: Patient, No Pcp Per      Brief Narrative:    Medical records reviewed and are as summarized below:  Alfred [name] is a 54 y.o. male       Assessment/Plan:   Principal Problem:   Streptococcal sepsis, unspecified (HCC) Active Problems:   Cellulitis of left leg   AKI (acute kidney injury) (HCC)   Streptococcal bacteremia   Severe strep sepsis (persistent fever and sinus tachycardia) and strep bacteremia secondary to left leg cellulitis AKI (organ dysfunction)-improved Debility/inability to walk Significantly elevated D-dimer (1,759.59) Diarrhea Hypokalemia  PLAN  Continue IV vancomycin and ceftriaxone Continue IV fluids with potassium chloride because of diarrhea and fever Consult ID tomorrow for further recommendations Tylenol and ibuprofen for pain and fever Replete potassium Analgesics as needed for pain Stool for C. difficile toxin done today was negative Imodium as needed for diarrhea Venous duplex of the lower extremity did not show evidence of DVT CTA of the chest has been ordered to rule out PE given significant tachycardia and elevated D-dimer   ADDENDUM  CTA chest did not show any acute abnormality or pulmonary embolism.  Patient will be transferred to stepdown unit because of persistent fever and tachycardia.  Increase fluid rate to 125 cc/h.  Reluctant to use IV beta-blockers for rate control since tachycardia is sinus tachycardia from sepsis/fever/ diarrhea.  Cautious trial of oral metoprolol since patient is slightly hypertensive.  Discussed with Tresa Endo, RN and Natalia Leatherwood, ICU charge nurse   ADDENDUM:  EKG was ordered and it showed narrow complex tachycardia suspicious for atrial flutter versus sinus tachycardia.  IV Cardizem bolus was ordered for rate control.  Troponins were ordered as well.   Body mass index is 28.16 kg/m.  Diet Order             Diet regular Room service appropriate? Yes; Fluid consistency: Thin  Diet effective now                      Medications:   . enoxaparin (LOVENOX) injection  40 mg Subcutaneous Q24H   Continuous Infusions: . cefTRIAXone (ROCEPHIN)  IV 2 g (11/26/19 0907)  . lactated ringers with kcl 75 mL/hr at 11/26/19 0533  . sodium chloride    . vancomycin 750 mg (11/26/19 0537)     Anti-infectives (From admission, onward)   Start     Dose/Rate Route Frequency Ordered Stop   11/25/19 0800  cefTRIAXone (ROCEPHIN) 2 g in sodium chloride 0.9 % 100 mL IVPB        2 g 200 mL/hr over 30 Minutes Intravenous Every 24 hours 11/25/19 0544     11/25/19 0600  vancomycin (VANCOREADY) IVPB 750 mg/150 mL        750 mg 150 mL/hr over 60 Minutes Intravenous Every 12 hours 11/24/19 2029     11/24/19 2200  piperacillin-tazobactam (ZOSYN) IVPB 3.375 g  Status:  Discontinued        3.375 g 12.5 mL/hr over 240 Minutes Intravenous Every 8 hours 11/24/19 2026 11/25/19 0543   11/24/19 2015  piperacillin-tazobactam (ZOSYN) IVPB 3.375 g  Status:  Discontinued        3.375 g 100 mL/hr over 30 Minutes Intravenous  Once 11/24/19 2004 11/24/19 2026   11/24/19 2015  vancomycin (VANCOCIN) IVPB 1000 mg/200 mL premix  Status:  Discontinued        1,000  mg 200 mL/hr over 60 Minutes Intravenous  Once 11/24/19 2004 11/24/19 2025   11/24/19 1845  vancomycin (VANCOREADY) IVPB 2000 mg/400 mL        2,000 mg 200 mL/hr over 120 Minutes Intravenous  Once 11/24/19 1748 11/25/19 0130   11/24/19 1745  ceFEPIme (MAXIPIME) 2 g in sodium chloride 0.9 % 100 mL IVPB  Status:  Discontinued        2 g 200 mL/hr over 30 Minutes Intravenous Every 8 hours 11/24/19 1732 11/24/19 2025             Family Communication/Anticipated D/C date and plan/Code Status   DVT prophylaxis: enoxaparin (LOVENOX) injection 40 mg Start: 11/24/19 2200     Code Status: Full Code  Family Communication: Plan discussed with  patient Disposition Plan:    Status is: Inpatient  Remains inpatient appropriate because:IV treatments appropriate due to intensity of illness or inability to take PO   Dispo: The patient is from: Home              Anticipated d/c is to: Home              Anticipated d/c date is: 3 days              Patient currently is not medically stable to d/c.           Subjective:   Overnight events noted.  He had persistent fever and significant tachycardia overnight.  He still has severe pain in the left leg.  He complains of watery diarrhea.  He thinks diarrhea started on the day of admission but prior to hospitalization.  No chest pain no shortness of breath.  Objective:    Vitals:   11/26/19 0254 11/26/19 0644 11/26/19 1116 11/26/19 1128  BP: (!) 145/94 112/68 (!) 137/114 (!) 128/56  Pulse: (!) 155 (!) 156 (!) 163 (!) 161  Resp: (!) 27 (!) 25 (!) 36 (!) 32  Temp: 100.2 F (37.9 C) (!) 100.5 F (38.1 C) (!) 101.3 F (38.5 C) (!) 102.3 F (39.1 C)  TempSrc: Oral Oral Oral Oral  SpO2: 96% 97% 96% 98%  Weight:      Height:       No data found.   Intake/Output Summary (Last 24 hours) at 11/26/2019 1151 Last data filed at 11/26/2019 0947 Gross per 24 hour  Intake 4067.58 ml  Output 1900 ml  Net 2167.58 ml   Filed Weights   11/24/19 1249  Weight: 113.4 kg    Exam:  GEN: NAD SKIN: Multiple pustular appearing lesions on the left leg.  Open wound with some yellowish drainage near the left medial malleolus.  Multiple small superficial ulcerations on the right leg EYES: EOMI ENT: MMM CV: Regular rate but tachycardic PULM: No wheezing or rales heard ABD: Soft and nontender CNS: Alert and oriented, no focal deficits EXT: Persistent swelling and tenderness of the left leg               Data Reviewed:   I have personally reviewed following labs and imaging studies:  Labs: Labs show the following:   Basic Metabolic Panel: Recent Labs  Lab  11/24/19 1720 11/24/19 1720 11/25/19 1426 11/25/19 1426 11/25/19 1938 11/26/19 0505  NA 136  --  135  --  134* 134*  K 4.1   < > 3.2*   < > 3.0* 3.4*  CL 99  --  98  --  97* 102  CO2 26  --  23  --  23 18*  GLUCOSE 140*  --  124*  --  124* 109*  BUN 15  --  15  --  15 15  CREATININE 1.27*  --  1.30*  --  1.23 1.10  CALCIUM 8.4*  --  8.2*  --  7.9* 8.2*  MG  --   --   --   --  2.1  --    < > = values in this interval not displayed.   GFR Estimated Creatinine Clearance: 111.6 mL/min (by C-G formula based on SCr of 1.1 mg/dL). Liver Function Tests: Recent Labs  Lab 11/24/19 1720  AST 47*  ALT 41  ALKPHOS 58  BILITOT 1.6*  PROT 8.3*  ALBUMIN 3.2*   No results for input(s): LIPASE, AMYLASE in the last 168 hours. No results for input(s): AMMONIA in the last 168 hours. Coagulation profile Recent Labs  Lab 11/24/19 1720 11/25/19 1426  INR 1.0 1.2    CBC: Recent Labs  Lab 11/24/19 1720 11/25/19 1426 11/26/19 0840  WBC 9.1 9.8 8.6  NEUTROABS 8.0*  --  7.3  HGB 12.9* 10.9* 10.2*  HCT 42.0 34.4* 33.1*  MCV 69.4* 68.3* 68.8*  PLT 185 159 142*   Cardiac Enzymes: No results for input(s): CKTOTAL, CKMB, CKMBINDEX, TROPONINI in the last 168 hours. BNP (last 3 results) No results for input(s): PROBNP in the last 8760 hours. CBG: Recent Labs  Lab 11/25/19 1913  GLUCAP 122*   D-Dimer: No results for input(s): DDIMER in the last 72 hours. Hgb A1c: No results for input(s): HGBA1C in the last 72 hours. Lipid Profile: No results for input(s): CHOL, HDL, LDLCALC, TRIG, CHOLHDL, LDLDIRECT in the last 72 hours. Thyroid function studies: No results for input(s): TSH, T4TOTAL, T3FREE, THYROIDAB in the last 72 hours.  Invalid input(s): FREET3 Anemia work up: No results for input(s): VITAMINB12, FOLATE, FERRITIN, TIBC, IRON, RETICCTPCT in the last 72 hours. Sepsis Labs: Recent Labs  Lab 11/24/19 1720 11/25/19 1426 11/25/19 1938 11/26/19 0505 11/26/19 0840   PROCALCITON  --  4.86  --   --   --   WBC 9.1 9.8  --   --  8.6  LATICACIDVEN 1.9 2.5* 1.7 1.7  --     Microbiology Recent Results (from the past 240 hour(s))  Culture, blood (Routine x 2)     Status: None (Preliminary result)   Collection Time: 11/24/19  3:14 PM   Specimen: BLOOD  Result Value Ref Range Status   Specimen Description BLOOD BLOOD RIGHT HAND  Final   Special Requests   Final    BOTTLES DRAWN AEROBIC AND ANAEROBIC Blood Culture results may not be optimal due to an inadequate volume of blood received in culture bottles   Culture   Final    NO GROWTH 2 DAYS Performed at [facility], [address] [place], [place] [address]    Report Status PENDING  Incomplete  Culture, blood (Routine x 2)     Status: None (Preliminary result)   Collection Time: 11/24/19  5:20 PM   Specimen: BLOOD  Result Value Ref Range Status   Specimen Description BLOOD BLOOD RIGHT ARM  Final   Special Requests   Final    BOTTLES DRAWN AEROBIC AND ANAEROBIC Blood Culture adequate volume   Culture  Setup Time   Final    Organism ID to follow GRAM POSITIVE COCCI IN BOTH AEROBIC AND ANAEROBIC BOTTLES CRITICAL RESULT CALLED TO, READ BACK BY AND VERIFIED WITH: SCOTT HALL 11/25/19 AT 0530 HS  Performed at [facility], [address] [place], [place] [address]    Culture [facility]  Final   Report Status PENDING  Incomplete  Blood Culture ID Panel (Reflexed)     Status: Abnormal   Collection Time: 11/24/19  5:20 PM  Result Value Ref Range Status   Enterococcus faecalis NOT DETECTED NOT DETECTED Final   Enterococcus Faecium NOT DETECTED NOT DETECTED Final   Listeria monocytogenes NOT DETECTED NOT DETECTED Final   Staphylococcus species NOT DETECTED NOT DETECTED Final   Staphylococcus aureus (BCID) NOT DETECTED NOT DETECTED Final   Staphylococcus epidermidis NOT DETECTED NOT DETECTED Final   Staphylococcus lugdunensis NOT DETECTED NOT DETECTED Final   Streptococcus  species DETECTED (A) NOT DETECTED Final    Comment: Not Enterococcus species, Streptococcus agalactiae, Streptococcus pyogenes, or Streptococcus pneumoniae. CRITICAL RESULT CALLED TO, READ BACK BY AND VERIFIED WITH: SCOTT HALL 11/25/19 AT 0530 HS    Streptococcus agalactiae NOT DETECTED NOT DETECTED Final   Streptococcus pneumoniae NOT DETECTED NOT DETECTED Final   Streptococcus pyogenes NOT DETECTED NOT DETECTED Final   A.calcoaceticus-baumannii NOT DETECTED NOT DETECTED Final   Bacteroides fragilis NOT DETECTED NOT DETECTED Final   Enterobacterales NOT DETECTED NOT DETECTED Final   Enterobacter cloacae complex NOT DETECTED NOT DETECTED Final   Escherichia coli NOT DETECTED NOT DETECTED Final   Klebsiella aerogenes NOT DETECTED NOT DETECTED Final   Klebsiella oxytoca NOT DETECTED NOT DETECTED Final   Klebsiella pneumoniae NOT DETECTED NOT DETECTED Final   Proteus species NOT DETECTED NOT DETECTED Final   Salmonella species NOT DETECTED NOT DETECTED Final   Serratia marcescens NOT DETECTED NOT DETECTED Final   Haemophilus influenzae NOT DETECTED NOT DETECTED Final   Neisseria meningitidis NOT DETECTED NOT DETECTED Final   Pseudomonas aeruginosa NOT DETECTED NOT DETECTED Final   Stenotrophomonas maltophilia NOT DETECTED NOT DETECTED Final   Candida albicans NOT DETECTED NOT DETECTED Final   Candida auris NOT DETECTED NOT DETECTED Final   Candida glabrata NOT DETECTED NOT DETECTED Final   Candida krusei NOT DETECTED NOT DETECTED Final   Candida parapsilosis NOT DETECTED NOT DETECTED Final   Candida tropicalis NOT DETECTED NOT DETECTED Final   Cryptococcus neoformans/gattii NOT DETECTED NOT DETECTED Final    Comment: Performed at [facility], [address] [place], [place] [address]  SARS Coronavirus 2 by RT PCR (hospital order, performed [facility]) Nasopharyngeal Nasopharyngeal Swab     Status: None   Collection Time: 11/24/19  7:40 PM   Specimen:  Nasopharyngeal Swab  Result Value Ref Range Status   SARS Coronavirus 2 NEGATIVE NEGATIVE Final    Comment: (NOTE) SARS-CoV-2 target nucleic acids are NOT DETECTED.  The SARS-CoV-2 RNA is generally detectable in upper and lower respiratory specimens during the acute phase of infection. The lowest concentration of SARS-CoV-2 viral copies this assay can detect is 250 copies / mL. A negative result does not preclude SARS-CoV-2 infection and should not be used as the sole basis for treatment or other patient management decisions.  A negative result may occur with improper specimen collection / handling, submission of specimen other than nasopharyngeal swab, presence of viral mutation(s) within the areas targeted by this assay, and inadequate number of viral copies (<250 copies / mL). A negative result must be combined with clinical observations, patient history, and epidemiological information.  Fact Sheet for Patients:   BoilerBrush.com.cyhttps://www.fda.gov/media/136312/download  Fact Sheet for Healthcare Providers: https://pope.com/https://www.fda.gov/media/136313/download  This test is not yet approved or  cleared by the [place] and has been authorized for detection and/or diagnosis of SARS-CoV-2 by FDA under an Emergency Use Authorization (EUA).  This EUA will remain in effect (meaning this test can be used) for the duration of the COVID-19 declaration under Section 564(b)(1) of the Act, 21 U.S.C. section 360bbb-3(b)(1), unless the authorization is terminated or revoked sooner.  Performed at [facility], [address] [place], [place] [address]   C Difficile Quick Screen w PCR reflex     Status: None   Collection Time: 11/25/19 10:04 AM   Specimen: STOOL  Result Value Ref Range Status   C Diff antigen NEGATIVE NEGATIVE Final   C Diff toxin NEGATIVE NEGATIVE Final   C Diff interpretation No C. difficile detected.  Final    Comment: Performed at [facility], [address] [place], [place] [address]  MRSA PCR Screening     Status: None   Collection Time: 11/25/19  4:20 PM   Specimen: Nasopharyngeal  Result Value Ref Range Status   MRSA by PCR NEGATIVE NEGATIVE Final    Comment:        The GeneXpert MRSA Assay (FDA approved for NASAL specimens only), is one component of a comprehensive MRSA colonization surveillance program. It is not intended to diagnose MRSA infection nor to guide or monitor treatment for MRSA infections. Performed at [facility], [address] [place], [place] [address]     Procedures and diagnostic studies:  DG Chest 1 View  Result Date: 11/25/2019 CLINICAL DATA:  Shortness of breath EXAM: CHEST  1 VIEW COMPARISON:  11/24/2019 FINDINGS: Single frontal view of the chest demonstrates a stable cardiac silhouette. No airspace disease, effusion, or pneumothorax. No acute bony abnormality. IMPRESSION: 1. Stable exam, no acute process. Electronically Signed   By: Sharlet Salina M.D.   On: 11/25/2019 20:37   US Venous Img Lower Bilateral  Result Date: 11/24/2019 CLINICAL DATA:  Swelling, pain and discoloration the bilateral lower extremities EXAM: BILATERAL LOWER EXTREMITY VENOUS DOPPLER ULTRASOUND TECHNIQUE: Gray-scale sonography with compression, as well as color and duplex ultrasound, were performed to evaluate the deep venous system(s) from the level of the common femoral vein through the popliteal and proximal calf veins. COMPARISON:  None FINDINGS: VENOUS Markedly suboptimal assessment bilateral lower extremities due to patient intolerance and pain with scanning procedure particularly at the level of the common femoral, saphenofemoral junctions and profundus femoral veins. Normal color flow and response to augmentation though waveforms are significantly degraded and compressibility is incompletely assessed. Normal color flow, compressibility and response to augmentation of the bilateral femoral and popliteal veins. Additional  limitations with the bilateral calf veins poorly visualized due to patient body habitus and severe lower extremity soft tissue edema seen on grayscale color Doppler ultrasound. OTHER Multiple prominent lymph nodes present in the bilateral groins, largest in the groin measuring up to 2.4 cm short axis albeit with preserved nodal architecture and thickened cortices favoring reactive or edematous change. Limitations: Body habitus and inability to tolerate compression. IMPRESSION: Suboptimal evaluation with imaging limitations as detailed above. Incomplete assessment of the bilateral common femoral, saphenofemoral profundus femoral veins due to lack of compression imaging. Calf veins are poorly visualized as well due to edema and body habitus. No demonstrable femoropopliteal DVT. If clinical symptoms are inconsistent or if there are persistent or worsening symptoms, further imaging (possibly involving the iliac veins) may be warranted. Electronically Signed   By: Kreg Shropshire M.D.   On: 11/24/2019  21:09   DG Chest Port 1 View  Result Date: 11/24/2019 CLINICAL DATA:  54 year old male with sepsis. EXAM: PORTABLE CHEST 1 VIEW COMPARISON:  Chest radiograph dated 06/21/2017. FINDINGS: The heart size and mediastinal contours are within normal limits. Both lungs are clear. The visualized skeletal structures are unremarkable. IMPRESSION: No active disease. Electronically Signed   By: Elgie Collard M.D.   On: 11/24/2019 16:41               LOS: 2 days   Aldena Worm  [facility]   Pager on [email or web address]. If 7PM-7AM, please contact night-coverage at [email or web address]     11/26/2019, 11:51 AM

## 2019-11-26 NOTE — Progress Notes (Signed)
   11/26/19 0254  Assess: MEWS Score  Temp 100.2 F (37.9 C)  BP (!) 145/94  Pulse Rate (!) 155  Resp (!) 27  SpO2 96 %  O2 Device Room Air  Assess: MEWS Score  MEWS Temp 0  MEWS Systolic 0  MEWS Pulse 3  MEWS RR 2  MEWS LOC 0  MEWS Score 5  MEWS Score Color Red   MEWS remain RED overnight but score has improved from 7 down to 5. Temp has improved after Tylenol and application of cooling blanket, however pt remains remains tachycardic HR between 155 to 160s. NP Ouma updated overnight, EKG done and reviewed. Per NP and Intensivist, no need for treatment for tachycardia for now as pt is septic but currently stable.In the meantime, Ca and KCl replacements given IV. Q4 vitals monitoring continued per RED MEWS protocol.

## 2019-11-27 ENCOUNTER — Inpatient Hospital Stay (HOSPITAL_COMMUNITY)
Admit: 2019-11-27 | Discharge: 2019-11-27 | Disposition: A | Discharge status: Home / Self Care | Payer: Self-pay | Attending: Internal Medicine | Admitting: Internal Medicine

## 2019-11-27 ENCOUNTER — Encounter: Payer: Self-pay | Admitting: Internal Medicine

## 2019-11-27 DIAGNOSIS — I4891 Unspecified atrial fibrillation: Secondary | ICD-10-CM

## 2019-11-27 DIAGNOSIS — R600 Localized edema: Secondary | ICD-10-CM

## 2019-11-27 DIAGNOSIS — Z720 Tobacco use: Secondary | ICD-10-CM

## 2019-11-27 DIAGNOSIS — I10 Essential (primary) hypertension: Secondary | ICD-10-CM

## 2019-11-27 DIAGNOSIS — R609 Edema, unspecified: Secondary | ICD-10-CM

## 2019-11-27 DIAGNOSIS — I4892 Unspecified atrial flutter: Secondary | ICD-10-CM

## 2019-11-27 DIAGNOSIS — E778 Other disorders of glycoprotein metabolism: Secondary | ICD-10-CM

## 2019-11-27 HISTORY — DX: Morbid (severe) obesity due to excess calories: E66.01

## 2019-11-27 HISTORY — DX: Tobacco use: Z72.0

## 2019-11-27 LAB — CBC WITH DIFFERENTIAL/PLATELET
Abs Immature Granulocytes: 0.21 10*3/uL — ABNORMAL HIGH (ref 0.00–0.07)
Basophils Absolute: 0 10*3/uL (ref 0.0–0.1)
Basophils Relative: 0 %
Eosinophils Absolute: 0.1 10*3/uL (ref 0.0–0.5)
Eosinophils Relative: 1 %
HCT: 32.6 % — ABNORMAL LOW (ref 39.0–52.0)
Hemoglobin: 9.8 g/dL — ABNORMAL LOW (ref 13.0–17.0)
Immature Granulocytes: 2 %
Lymphocytes Relative: 5 %
Lymphs Abs: 0.4 10*3/uL — ABNORMAL LOW (ref 0.7–4.0)
MCH: 21.3 pg — ABNORMAL LOW (ref 26.0–34.0)
MCHC: 30.1 g/dL (ref 30.0–36.0)
MCV: 70.9 fL — ABNORMAL LOW (ref 80.0–100.0)
Monocytes Absolute: 0.7 10*3/uL (ref 0.1–1.0)
Monocytes Relative: 8 %
Neutro Abs: 7.4 10*3/uL (ref 1.7–7.7)
Neutrophils Relative %: 84 %
Platelets: 157 10*3/uL (ref 150–400)
RBC: 4.6 MIL/uL (ref 4.22–5.81)
RDW: 16.9 % — ABNORMAL HIGH (ref 11.5–15.5)
Smear Review: NORMAL
WBC: 8.9 10*3/uL (ref 4.0–10.5)
nRBC: 0.3 % — ABNORMAL HIGH (ref 0.0–0.2)

## 2019-11-27 LAB — CULTURE, BLOOD (ROUTINE X 2): Special Requests: ADEQUATE

## 2019-11-27 LAB — ECHOCARDIOGRAM COMPLETE
Height: 79 in
S' Lateral: 3.52 cm
Weight: 6959.48 oz

## 2019-11-27 LAB — BASIC METABOLIC PANEL
Anion gap: 9 (ref 5–15)
BUN: 15 mg/dL (ref 6–20)
CO2: 25 mmol/L (ref 22–32)
Calcium: 8.1 mg/dL — ABNORMAL LOW (ref 8.9–10.3)
Chloride: 103 mmol/L (ref 98–111)
Creatinine, Ser: 0.87 mg/dL (ref 0.61–1.24)
GFR calc Af Amer: >60 mL/min (ref >60)
GFR calc non Af Amer: >60 mL/min (ref >60)
Glucose, Bld: 129 mg/dL — ABNORMAL HIGH (ref 70–99)
Potassium: 3.5 mmol/L (ref 3.5–5.1)
Sodium: 137 mmol/L (ref 135–145)

## 2019-11-27 LAB — TSH: TSH: 2.156 u[IU]/mL (ref 0.350–4.500)

## 2019-11-27 LAB — HEPARIN LEVEL (UNFRACTIONATED): Heparin Unfractionated: <0.1 IU/mL — ABNORMAL LOW (ref 0.30–0.70)

## 2019-11-27 LAB — PATHOLOGIST SMEAR REVIEW

## 2019-11-27 MED ORDER — SODIUM CHLORIDE 0.9 % IV BOLUS
500.0000 mL | Freq: Once | INTRAVENOUS | Status: AC
  Administered 2019-11-27: 500 mL via INTRAVENOUS

## 2019-11-27 MED ORDER — DIGOXIN 0.25 MG/ML IJ SOLN
0.2500 mg | INTRAMUSCULAR | Status: DC | PRN
  Administered 2019-11-27: 0.25 mg via INTRAVENOUS
  Filled 2019-11-27: qty 2

## 2019-11-27 MED ORDER — MELATONIN 5 MG PO TABS
5.0000 mg | ORAL_TABLET | Freq: Every evening | ORAL | Status: DC | PRN
  Administered 2019-11-27 – 2019-12-05 (×3): 5 mg via ORAL
  Filled 2019-11-27 (×4): qty 1

## 2019-11-27 MED ORDER — GUAIFENESIN 100 MG/5ML PO SOLN
5.0000 mL | ORAL | Status: DC | PRN
  Administered 2019-11-27 – 2019-11-29 (×2): 100 mg via ORAL
  Filled 2019-11-27 (×4): qty 5

## 2019-11-27 MED ORDER — HEPARIN BOLUS VIA INFUSION
5600.0000 [IU] | Freq: Once | INTRAVENOUS | Status: AC
  Administered 2019-11-27: 5600 [IU] via INTRAVENOUS
  Filled 2019-11-27: qty 5600

## 2019-11-27 MED ORDER — HEPARIN BOLUS VIA INFUSION
4000.0000 [IU] | Freq: Once | INTRAVENOUS | Status: AC
  Administered 2019-11-27: 4000 [IU] via INTRAVENOUS
  Filled 2019-11-27: qty 4000

## 2019-11-27 MED ORDER — POTASSIUM CHLORIDE CRYS ER 20 MEQ PO TBCR
40.0000 meq | EXTENDED_RELEASE_TABLET | Freq: Once | ORAL | Status: AC
  Administered 2019-11-27: 40 meq via ORAL
  Filled 2019-11-27: qty 2

## 2019-11-27 MED ORDER — HEPARIN (PORCINE) 25000 UT/250ML-% IV SOLN
2800.0000 [IU]/h | INTRAVENOUS | Status: DC
  Administered 2019-11-27: 1500 [IU]/h via INTRAVENOUS
  Administered 2019-11-27: 1900 [IU]/h via INTRAVENOUS
  Administered 2019-11-28: 2400 [IU]/h via INTRAVENOUS
  Administered 2019-11-29: 2800 [IU]/h via INTRAVENOUS
  Administered 2019-11-29: 2650 [IU]/h via INTRAVENOUS
  Administered 2019-11-30 – 2019-12-01 (×5): 2800 [IU]/h via INTRAVENOUS
  Filled 2019-11-27 (×11): qty 250

## 2019-11-27 MED ORDER — SODIUM CHLORIDE 0.9 % IV SOLN: INTRAVENOUS | Status: DC

## 2019-11-27 NOTE — Progress Notes (Signed)
ANTICOAGULATION CONSULT NOTE  Pharmacy Consult for heparin Indication: atrial flutter  No Known Allergies  Patient Measurements: Height: 6\' 7"  (200.7 cm) Weight: (!) 197.3 kg (434 lb 15.5 oz) IBW/kg (Calculated) : 93.7 Heparin Dosing Weight: 141 kg (heparin adjusted body weight), initially used 113 kg  Vital Signs: Temp: 98.3 F (36.8 C) (08/23 0800) Temp Source: Oral (08/23 0800) BP: 108/74 (08/23 1132) Pulse Rate: 109 (08/23 1132)  Labs: Recent Labs    11/24/19 1720 11/24/19 1720 11/25/19 1426 11/25/19 1426 11/25/19 1938 11/26/19 0505 11/26/19 0840 11/26/19 1811 11/26/19 2000 11/27/19 0426  HGB 12.9*   < > 10.9*   < >  --   --  10.2*  --   --  9.8*  HCT 42.0   < > 34.4*  --   --   --  33.1*  --   --  32.6*  PLT 185   < > 159  --   --   --  142*  --   --  157  LABPROT 13.1  --  14.4  --   --   --   --   --   --   --   INR 1.0  --  1.2  --   --   --   --   --   --   --   CREATININE 1.27*   < > 1.30*   < > 1.23 1.10  --   --   --  0.87  TROPONINIHS  --   --   --   --   --   --   --  27* 27*  --    < > = values in this interval not displayed.    Estimated Creatinine Clearance: 187.6 mL/min (by C-G formula based on SCr of 0.87 mg/dL).   Medical History: Past Medical History:  Diagnosis Date   Lower extremity edema    Morbid obesity (HCC) 11/27/2019   Tobacco abuse 11/27/2019    Assessment: 54 year old patient with strep group G bacteremia. In atrial flutter with RVR on amiodarone and diltiazem. Plan for possible cardioversion. Pharmacy to start on heparin drip.  Goal of Therapy:  Heparin level 0.3-0.7 units/ml Monitor platelets by anticoagulation protocol: Yes   Plan:  Heparin 5600 unit bolus followed by heparin drip at 1500 units/hr (dosed conservatively due to question about accurate weight for patient and guaic positive with slow trend down in Hg). It does appear that 197 kg may be more accurate weight rather than 113 kg previously charted. HDW ~ 140 kg  adjusted for IBW. HL ordered for 1700. CBC with morning labs.  40, PharmD 11/27/2019,11:56 AM

## 2019-11-27 NOTE — Progress Notes (Signed)
ANTICOAGULATION CONSULT NOTE  Pharmacy Consult for heparin Indication: atrial flutter  No Known Allergies  Patient Measurements: Height: 6\' 7"  (200.7 cm) Weight: (!) 197.3 kg (434 lb 15.5 oz) IBW/kg (Calculated) : 93.7 Heparin Dosing Weight: 141 kg (heparin adjusted body weight), initially used 113 kg  Vital Signs: Temp: 98.9 F (37.2 C) (08/23 1600) Temp Source: Oral (08/23 1600) BP: 133/76 (08/23 1700) Pulse Rate: 159 (08/23 1700)  Labs: Recent Labs    11/25/19 1426 11/25/19 1426 11/25/19 1938 11/26/19 0505 11/26/19 0840 11/26/19 1811 11/26/19 2000 11/27/19 0426 11/27/19 1710  HGB 10.9*   < >  --   --  10.2*  --   --  9.8*  --   HCT 34.4*  --   --   --  33.1*  --   --  32.6*  --   PLT 159  --   --   --  142*  --   --  157  --   LABPROT 14.4  --   --   --   --   --   --   --   --   INR 1.2  --   --   --   --   --   --   --   --   HEPARINUNFRC  --   --   --   --   --   --   --   --  <0.10*  CREATININE 1.30*   < > 1.23 1.10  --   --   --  0.87  --   TROPONINIHS  --   --   --   --   --  27* 27*  --   --    < > = values in this interval not displayed.    Estimated Creatinine Clearance: 187.6 mL/min (by C-G formula based on SCr of 0.87 mg/dL).   Medical History: Past Medical History:  Diagnosis Date  . Lower extremity edema   . Morbid obesity (HCC) 11/27/2019  . Tobacco abuse 11/27/2019    Assessment: 54 year old patient with strep group G bacteremia. In atrial flutter with RVR on amiodarone and diltiazem. Plan for possible cardioversion. Pharmacy to start on heparin drip.  8/23 1710 HL < 0.1  Goal of Therapy:  Heparin level 0.3-0.7 units/ml Monitor platelets by anticoagulation protocol: Yes   Plan:  Heparin is subtherapeutic. Will give 4000 units bolus followed by an increase in rate to 1900 units/hr. Hgb slightly trending down. CBC daily while on heparin.   9/23, PharmD, BCPS 11/27/2019,6:22 PM

## 2019-11-27 NOTE — Consult Note (Signed)
NAME: Alfred [name]  DOB: Dec 19, 1965  MRN: [id number]  Date/Time: 11/27/2019 11:50 AM  REQUESTING PROVIDER: Dr. Burman Blacksmith Subjective:  REASON FOR CONSULT: Bacteremia with b/l leg infection ? Alfred [name] is a 54 y.o. male with a history of chronic venous edema of the legs, hepatitis C RNA positive, presented from home with swelling of the legs for 2 to 3 weeks duration with oozing and pain in the legs and inability to walk.  He also had severe diarrhea on the day of presentation which was on 11/24/2019. He denied fever at home. But in the ED his temperature was initially 99.5 and increased to 104 , blood pressure was 139/95,Blood cultures were sent and he was started on vancomycin and cefepime. He also was noted to have fast A. fib.  He is currently in ICU. I am asked to see the patient because of the blood culture being positive for Streptococcus group G bacteria. Patient has no hardware in his body.  He was followed at [facility] and his last visit there was January 2020.  At that visit he was noted to have hepatitis C RNA being positive and was referred to GI but he never followed up. He also had seen podiatrist January 2020 for left venous ulceration. Patient has not followed up since then.  She does not take any medications. Says he is feeling better now. He has history of MRSA abscess in the past.    Past Medical History:  Diagnosis Date  . Lower extremity edema   . Morbid obesity (HCC) 11/27/2019  . Tobacco abuse 11/27/2019    Past Surgical History:  Procedure Laterality Date  . DEBRIDEMENT OF ABDOMINAL WALL ABSCESS      Social History   Socioeconomic History  . Marital status: Single    Spouse name: Not on file  . Number of children: Not on file  . Years of education: Not on file  . Highest education level: Not on file  Occupational History  . Not on file  Tobacco Use  . Smoking status: Current Every Day Smoker    Packs/day: 0.10    Types: Cigarettes  . Smokeless  tobacco: Never Used  . Tobacco comment: smokes 1/2 pack per week.  Substance and Sexual Activity  . Alcohol use: Yes    Comment: 1 beer/month  . Drug use: Yes    Types: Marijuana    Comment: denies this 11/27/2019  . Sexual activity: Not on file  Other Topics Concern  . Not on file  Social History Narrative   Lives in [place] w/ family.  Currently unemployed.  Does not routinely exercise.   Social Determinants of Health   Financial Resource Strain:   . Difficulty of Paying Living Expenses: Not on file  Food Insecurity:   . Worried About Programme researcher, broadcasting/film/video in the Last Year: Not on file  . Ran Out of Food in the Last Year: Not on file  Transportation Needs:   . Lack of Transportation (Medical): Not on file  . Lack of Transportation (Non-Medical): Not on file  Physical Activity:   . Days of Exercise per Week: Not on file  . Minutes of Exercise per Session: Not on file  Stress:   . Feeling of Stress : Not on file  Social Connections:   . Frequency of Communication with Friends and Family: Not on file  . Frequency of Social Gatherings with Friends and Family: Not on file  . Attends Religious Services: Not on file  .  Active Member of Clubs or Organizations: Not on file  . Attends Banker Meetings: Not on file  . Marital Status: Not on file  Intimate Partner Violence:   . Fear of Current or Ex-Partner: Not on file  . Emotionally Abused: Not on file  . Physically Abused: Not on file  . Sexually Abused: Not on file    Family History  Problem Relation Age of Onset  . Heart disease Mother        died in her 54's  . Heart disease Father        died in his 64's   No Known Allergies ? Current Facility-Administered Medications  Medication Dose Route Frequency Provider Last Rate Last Admin  . acetaminophen (TYLENOL) tablet 650 mg  650 mg Oral Q6H PRN Lurene Shadow, MD   650 mg at 11/26/19 1133   Or  . acetaminophen (TYLENOL) suppository 650 mg  650 mg Rectal  Q6H PRN Lurene Shadow, MD      . amiodarone (NEXTERONE PREMIX) 360-4.14 MG/200ML-% (1.8 mg/mL) IV infusion  30 mg/hr Intravenous Continuous Lindajo Royal V, MD 16.67 mL/hr at 11/27/19 1100 30 mg/hr at 11/27/19 1100  . cefTRIAXone (ROCEPHIN) 2 g in sodium chloride 0.9 % 100 mL IVPB  2 g Intravenous Q24H Lurene Shadow, MD   Stopped at 11/27/19 [phone]  . Chlorhexidine Gluconate Cloth 2 % PADS 6 each  6 each Topical Daily Lurene Shadow, MD   6 each at 11/26/19 1730  . diltiazem (CARDIZEM) 125 mg in dextrose 5% 125 mL (1 mg/mL) infusion  5-15 mg/hr Intravenous Continuous Andris Baumann, MD 10 mL/hr at 11/27/19 1100 10 mg/hr at 11/27/19 1100  . heparin ADULT infusion 100 units/mL (25000 units/265mL sodium chloride 0.45%)  1,500 Units/hr Intravenous Continuous Lurene Shadow, MD 15 mL/hr at 11/27/19 1100 1,500 Units/hr at 11/27/19 1100  . HYDROcodone-acetaminophen (NORCO/VICODIN) 5-325 MG per tablet 1-2 tablet  1-2 tablet Oral Q4H PRN Lurene Shadow, MD   2 tablet at 11/24/19 2242  . ibuprofen (ADVIL) tablet 400 mg  400 mg Oral Q6H PRN Lurene Shadow, MD   400 mg at 11/26/19 1614  . lactated ringers 1,000 mL with potassium chloride 20 mEq infusion   Intravenous Continuous Lurene Shadow, MD 125 mL/hr at 11/27/19 1100 Rate Verify at 11/27/19 1100  . metoprolol tartrate (LOPRESSOR) tablet 25 mg  25 mg Oral BID Lurene Shadow, MD   25 mg at 11/27/19 1015  . morphine 2 MG/ML injection 2 mg  2 mg Intravenous Q2H PRN Lurene Shadow, MD   2 mg at 11/26/19 2250  . ondansetron (ZOFRAN) tablet 4 mg  4 mg Oral Q6H PRN Lurene Shadow, MD       Or  . ondansetron (ZOFRAN) injection 4 mg  4 mg Intravenous Q6H PRN Lurene Shadow, MD      . sodium chloride 0.9 % bolus 1,000 mL  1,000 mL Intravenous Once Lurene Shadow, MD      . vancomycin Luna Kitchens) IVPB 750 mg/150 mL  750 mg Intravenous Q12H Lurene Shadow, MD   Stopped at 11/27/19 [phone]     Abtx:  Anti-infectives (From admission, onward)   Start     Dose/Rate  Route Frequency Ordered Stop   11/25/19 0800  cefTRIAXone (ROCEPHIN) 2 g in sodium chloride 0.9 % 100 mL IVPB        2 g 200 mL/hr over 30 Minutes Intravenous Every 24 hours 11/25/19 0544     11/25/19 0600  vancomycin (VANCOREADY) IVPB 750 mg/150  mL        750 mg 150 mL/hr over 60 Minutes Intravenous Every 12 hours 11/24/19 2029     11/24/19 2200  piperacillin-tazobactam (ZOSYN) IVPB 3.375 g  Status:  Discontinued        3.375 g 12.5 mL/hr over 240 Minutes Intravenous Every 8 hours 11/24/19 2026 11/25/19 0543   11/24/19 2015  piperacillin-tazobactam (ZOSYN) IVPB 3.375 g  Status:  Discontinued        3.375 g 100 mL/hr over 30 Minutes Intravenous  Once 11/24/19 2004 11/24/19 2026   11/24/19 2015  vancomycin (VANCOCIN) IVPB 1000 mg/200 mL premix  Status:  Discontinued        1,000 mg 200 mL/hr over 60 Minutes Intravenous  Once 11/24/19 2004 11/24/19 2025   11/24/19 1845  vancomycin (VANCOREADY) IVPB 2000 mg/400 mL        2,000 mg 200 mL/hr over 120 Minutes Intravenous  Once 11/24/19 1748 11/25/19 0130   11/24/19 1745  ceFEPIme (MAXIPIME) 2 g in sodium chloride 0.9 % 100 mL IVPB  Status:  Discontinued        2 g 200 mL/hr over 30 Minutes Intravenous Every 8 hours 11/24/19 1732 11/24/19 2025      REVIEW OF SYSTEMS:  Const: negative fever, negative chills, negative weight loss Eyes: negative diplopia or visual changes, negative eye pain ENT: negative coryza, negative sore throat Resp: negative cough, hemoptysis, dyspnea Cards: negative for chest pain, palpitations, lower extremity edema GU: negative for frequency, dysuria and hematuria GI: Negative for abdominal pain, diarrhea, bleeding, constipation Skin: negative for rash and pruritus Heme: negative for easy bruising and gum/nose bleeding MS: negative for myalgias, arthralgias, back pain and muscle weakness Neurolo:negative for headaches, dizziness, vertigo, memory problems  Psych: negative for feelings of anxiety, depression    Endocrine: negative for thyroid, diabetes Allergy/Immunology- negative for any medication or food allergies ? Pertinent Positives include : Objective:  VITALS:  BP 108/74   Pulse (!) 109   Temp 98.3 F (36.8 C) (Oral)   Resp (!) 24   Ht 6\' 7"  (2.007 m)   Wt (!) 197.3 kg   SpO2 98%   BMI 49.00 kg/m  PHYSICAL EXAM:  General: Alert, cooperative, no distress, appears stated age.  Morbidly obese Head: Normocephalic, without obvious abnormality, atraumatic. Eyes: Conjunctivae clear, anicteric sclerae. Pupils are equal ENT Nares normal. No drainage or sinus tenderness. Lips, mucosa, and tongue normal. No Thrush Poor dentition with missing teeth Neck: Supple, symmetrical, no adenopathy, thyroid: non tender no carotid bruit and no JVD. Back: No CVA tenderness. Lungs: Bilateral air entry Heart: Irregular heart rate with RVR Abdomen: Soft, non-tender,not distended. Bowel sounds normal. No masses Extremities: Bilateral venous edema, bilateral venous pigmentation and hyperpigmentation Punched-out lesions on the right leg and multiple circular lesions on the left leg covered with whitish mucoid skin Healed left ankle ulceration.  Also has deroofed blisters Skin:        As above Lymph: Cervical, supraclavicular normal. Neurologic: Grossly non-focal Pertinent Labs Lab Results CBC    Component Value Date/Time   WBC 8.9 11/27/2019 0426   RBC 4.60 11/27/2019 0426   HGB 9.8 (L) 11/27/2019 0426   HCT 32.6 (L) 11/27/2019 0426   PLT 157 11/27/2019 0426   MCV 70.9 (L) 11/27/2019 0426   MCH 21.3 (L) 11/27/2019 0426   MCHC 30.1 11/27/2019 0426   RDW 16.9 (H) 11/27/2019 0426   LYMPHSABS 0.4 (L) 11/27/2019 0426   MONOABS 0.7 11/27/2019 0426   EOSABS 0.1  11/27/2019 0426   BASOSABS 0.0 11/27/2019 0426    CMP Latest Ref Rng & Units 11/27/2019 11/26/2019 11/25/2019  Glucose 70 - 99 mg/dL 161(W) 960(A) 540(J)  BUN 6 - 20 mg/dL Creatinine 0.61 - 1.24 mg/dL 8.11 9.14 7.82   Sodium 135 - 145 mmol/L 137 134(L) 134(L)  Potassium 3.5 - 5.1 mmol/L 3.5 3.4(L) 3.0(L)  Chloride 98 - 111 mmol/L 103 102 97(L)  CO2 22 - 32 mmol/L 25 18(L) 23  Calcium 8.9 - 10.3 mg/dL 8.1(L) 8.2(L) 7.9(L)  Total Protein 6.5 - 8.1 g/dL - - -  Total Bilirubin 0.3 - 1.2 mg/dL - - -  Alkaline Phos 38 - 126 U/L - - -  AST 15 - 41 U/L - - -  ALT 0 - 44 U/L - - -      Microbiology: Recent Results (from the past 240 hour(s))  Culture, blood (Routine x 2)     Status: None (Preliminary result)   Collection Time: 11/24/19  3:14 PM   Specimen: BLOOD  Result Value Ref Range Status   Specimen Description BLOOD BLOOD RIGHT HAND  Final   Special Requests   Final    BOTTLES DRAWN AEROBIC AND ANAEROBIC Blood Culture results may not be optimal due to an inadequate volume of blood received in culture bottles   Culture   Final    NO GROWTH 3 DAYS Performed at [facility], [address] [place], [place] [address]    Report Status PENDING  Incomplete  Culture, blood (Routine x 2)     Status: Abnormal   Collection Time: 11/24/19  5:20 PM   Specimen: BLOOD  Result Value Ref Range Status   Specimen Description   Final    BLOOD BLOOD RIGHT ARM Performed at [facility], [address] [place], [place] [address]    Special Requests   Final    BOTTLES DRAWN AEROBIC AND ANAEROBIC Blood Culture adequate volume Performed at [facility], [address] [place], [place] [address]    Culture  Setup Time   Final    Organism ID to follow GRAM POSITIVE COCCI IN BOTH AEROBIC AND ANAEROBIC BOTTLES CRITICAL RESULT CALLED TO, READ BACK BY AND VERIFIED WITH: SCOTT HALL 11/25/19 AT 0530 HS Performed at [facility], [address] [place], [place] [address]    Culture STREPTOCOCCUS GROUP G (A)  Final   Report Status 11/27/2019 FINAL  Final   Organism ID, Bacteria STREPTOCOCCUS GROUP G  Final      Susceptibility   Streptococcus group g - MIC*    CLINDAMYCIN  RESISTANT Resistant     AMPICILLIN <=0.25 SENSITIVE Sensitive     ERYTHROMYCIN >=8 RESISTANT Resistant     VANCOMYCIN 0.5 SENSITIVE Sensitive     CEFTRIAXONE <=0.12 SENSITIVE Sensitive     LEVOFLOXACIN 0.5 SENSITIVE Sensitive     * STREPTOCOCCUS GROUP G  Blood Culture ID Panel (Reflexed)     Status: Abnormal   Collection Time: 11/24/19  5:20 PM  Result Value Ref Range Status   Enterococcus faecalis NOT DETECTED NOT DETECTED Final   Enterococcus Faecium NOT DETECTED NOT DETECTED Final   Listeria monocytogenes NOT DETECTED NOT DETECTED Final   Staphylococcus species NOT DETECTED NOT DETECTED Final   Staphylococcus aureus (BCID) NOT DETECTED NOT DETECTED Final   Staphylococcus epidermidis NOT DETECTED NOT DETECTED Final   Staphylococcus lugdunensis NOT DETECTED NOT DETECTED Final   Streptococcus species DETECTED (A) NOT DETECTED Final  Comment: Not Enterococcus species, Streptococcus agalactiae, Streptococcus pyogenes, or Streptococcus pneumoniae. CRITICAL RESULT CALLED TO, READ BACK BY AND VERIFIED WITH: SCOTT HALL 11/25/19 AT 0530 HS    Streptococcus agalactiae NOT DETECTED NOT DETECTED Final   Streptococcus pneumoniae NOT DETECTED NOT DETECTED Final   Streptococcus pyogenes NOT DETECTED NOT DETECTED Final   A.calcoaceticus-baumannii NOT DETECTED NOT DETECTED Final   Bacteroides fragilis NOT DETECTED NOT DETECTED Final   Enterobacterales NOT DETECTED NOT DETECTED Final   Enterobacter cloacae complex NOT DETECTED NOT DETECTED Final   Escherichia coli NOT DETECTED NOT DETECTED Final   Klebsiella aerogenes NOT DETECTED NOT DETECTED Final   Klebsiella oxytoca NOT DETECTED NOT DETECTED Final   Klebsiella pneumoniae NOT DETECTED NOT DETECTED Final   Proteus species NOT DETECTED NOT DETECTED Final   Salmonella species NOT DETECTED NOT DETECTED Final   Serratia marcescens NOT DETECTED NOT DETECTED Final   Haemophilus influenzae NOT DETECTED NOT DETECTED Final   Neisseria meningitidis NOT  DETECTED NOT DETECTED Final   Pseudomonas aeruginosa NOT DETECTED NOT DETECTED Final   Stenotrophomonas maltophilia NOT DETECTED NOT DETECTED Final   Candida albicans NOT DETECTED NOT DETECTED Final   Candida auris NOT DETECTED NOT DETECTED Final   Candida glabrata NOT DETECTED NOT DETECTED Final   Candida krusei NOT DETECTED NOT DETECTED Final   Candida parapsilosis NOT DETECTED NOT DETECTED Final   Candida tropicalis NOT DETECTED NOT DETECTED Final   Cryptococcus neoformans/gattii NOT DETECTED NOT DETECTED Final    Comment: Performed at [facility], [address] [place], [place] [address]  SARS Coronavirus 2 by RT PCR (hospital order, performed in [facility] hospital lab) Nasopharyngeal Nasopharyngeal Swab     Status: None   Collection Time: 11/24/19  7:40 PM   Specimen: Nasopharyngeal Swab  Result Value Ref Range Status   SARS Coronavirus 2 NEGATIVE NEGATIVE Final    Comment: (NOTE) SARS-CoV-2 target nucleic acids are NOT DETECTED.  The SARS-CoV-2 RNA is generally detectable in upper and lower respiratory specimens during the acute phase of infection. The lowest concentration of SARS-CoV-2 viral copies this assay can detect is 250 copies / mL. A negative result does not preclude SARS-CoV-2 infection and should not be used as the sole basis for treatment or other patient management decisions.  A negative result may occur with improper specimen collection / handling, submission of specimen other than nasopharyngeal swab, presence of viral mutation(s) within the areas targeted by this assay, and inadequate number of viral copies (<250 copies / mL). A negative result must be combined with clinical observations, patient history, and epidemiological information.  Fact Sheet for Patients:   BoilerBrush.com.cy  Fact Sheet for Healthcare Providers: https://pope.com/  This test is not yet approved or  cleared by the [place] FDA and has been authorized for detection and/or diagnosis of SARS-CoV-2 by FDA under an Emergency Use Authorization (EUA).  This EUA will remain in effect (meaning this test can be used) for the duration of the COVID-19 declaration under Section 564(b)(1) of the Act, 21 U.S.C. section 360bbb-3(b)(1), unless the authorization is terminated or revoked sooner.  Performed at [facility], [address] [place], [place] [address]   C Difficile Quick Screen w PCR reflex     Status: None   Collection Time: 11/25/19 10:04 AM   Specimen: STOOL  Result Value Ref Range Status   C Diff antigen NEGATIVE NEGATIVE Final   C Diff toxin NEGATIVE NEGATIVE Final   C Diff interpretation No C.  difficile detected.  Final    Comment: Performed at [facility], [address] [place], [place] [address]  MRSA PCR Screening     Status: None   Collection Time: 11/25/19  4:20 PM   Specimen: Nasopharyngeal  Result Value Ref Range Status   MRSA by PCR NEGATIVE NEGATIVE Final    Comment:        The GeneXpert MRSA Assay (FDA approved for NASAL specimens only), is one component of a comprehensive MRSA colonization surveillance program. It is not intended to diagnose MRSA infection nor to guide or monitor treatment for MRSA infections. Performed at [facility], [address] [place], [place] [address]     IMAGING RESULTS:  I have personally reviewed the films ?  Impression/Recommendation ? ?streptococcus Group G bacteremia ?Likely source legs Patient currently on vancomycin and ceftriaxone.  Can DC both and changed to Unasyn.  Bilateral venous edema with punched-out lesions on the right leg and circular lesions on the left leg.  This is unusual for cellulitis and need to rule out HSV or erosive lichen planus. Sent one of the wound culture for bacteria as well as virus.  A. fib with RVR.  Management as per cardiology.  Currently on amiodarone and  diltiazem.  Plan is for cardioversion.  AKI on presentation resolved.  Anemia  Hepatitis C infection with RNA being positive in January 2020.  I do not see documentation of him being treated.  Morbid obesity  ___________________________________________________ Discussed with patient, his nurse and pharmacist.  Note:  This document was prepared using Dragon voice recognition software and may include unintentional dictation errors.

## 2019-11-27 NOTE — Progress Notes (Signed)
*  PRELIMINARY RESULTS* Echocardiogram 2D Echocardiogram has been performed.  Alfred [name] 11/27/2019, 8:53 AM

## 2019-11-27 NOTE — Consult Note (Signed)
WOC Nurse Consult Note: Reason for Consult: Suspected venous insufficiency with lesions on bilateral LEs (circular, clustered) Wound type: Presentation of ulcerations is inconsistent with classic venous leg ulcers, but with hemosiderin staining. Requires diagnostic work up (ABI) for definitive diagnosis as CVI/VLU. Pressure Injury POA: N/A Measurement: Cluster of lesions on RLE (medial aspect) measures 7cm x 5cm. Cluster of lesions on anterior LLE measures 6cm x 5cm  Wound bed: Pink (majority), light yellow. Drainage (amount, consistency, odor) Serous to light yellow exudate Periwound: mild edema, hemosiderin staining Dressing procedure/placement/frequency: I will provide guidance for topical care using soap and water cleanse, rinse and pat dry, to improve skin integrity (which is dry) and decrease bacterial load. POC will them be to cover lesions with a nonadherent antimicrobial (xeroform), followed by dry gauze, an ABD. Securement will be with a "dry boot", i.e., a Kerlix roll gauze wrapped from just below toes to just below knee topped with a 6-inch ACE bandage wrapped in a similar manner. Feet are to be placed into Pressure redistribution heel boots (Prevalon).   Recommend referral to the outpatient [facility] for continuing care of the LEs, definitive diagnosis by ABI, fitting for compression hosiery, etc. At discharge.  Patient is amenable to this suggestion and states he has someone who could transport him to the [facility] once weekly or every other week as prescribed.  If you agree, please refer/consult with the [facility] at discharge.  WOC nursing team will not follow, but will remain available to this patient, the nursing and medical teams.  Please re-consult if needed. Thanks, Ladona Mow, MSN, RN, GNP, [place]  Pager# [phone]

## 2019-11-27 NOTE — Progress Notes (Signed)
Pt in  A-Fib @ 156, asymptomatic; Dr. Doylene Canard notified; order given for Cardizem bolus, then initiate Cardizem gtt. Pt  didn't respond to this treatment. MD notified, lopressor 5 mg iv x 1 dose given. At 0030 MD.ordered Amiodarone bolus &  given; then Amiodarone gtt initiated with continuous Cardizem gtt @ 5 mg/hr. 0315 NS 500 cc bolus x 1 dose ordered & given. Will continue monitor pt closely.

## 2019-11-27 NOTE — Progress Notes (Signed)
Progress Note    Khristopher Kapaun  NWG:[id number] DOB: 1965/12/23  DOA: 11/24/2019 PCP: Patient, No Pcp Per      Brief Narrative:    Medical records reviewed and are as summarized below:  Alfred [name] is a 54 y.o. male       Assessment/Plan:   Principal Problem:   Streptococcal sepsis, unspecified (HCC) Active Problems:   Cellulitis of left leg   AKI (acute kidney injury) (HCC)   Streptococcal bacteremia   Lower extremity edema   Morbid obesity (HCC)   Tobacco abuse   Severe strep sepsis (persistent fever and sinus tachycardia) and strep bacteremia secondary to left leg cellulitis AKI (organ dysfunction)-improved Debility/inability to walk Significantly elevated D-dimer (1,759.59) Diarrhea-stool for C. difficile toxin on 11/26/2019 was negative Hypokalemia  PLAN  Continue IV antibiotics Discontinue IV fluids Continue IV amiodarone and IV Cardizem infusion He has been started on IV heparin infusion by cardiologist. Plan for possible TEE tomorrow if heart rate fails to improve or fails to convert to NSR Continue potassium repletion Analgesia as needed for pain. Imodium as needed for diarrhea No evidence of DVT or PE on imaging Follow-up with cardiologist for further recommendations      Body mass index is 49 kg/m.  (Morbid obesity)  Diet Order            Diet regular Room service appropriate? Yes; Fluid consistency: Thin  Diet effective now                      Medications:   . Chlorhexidine Gluconate Cloth  6 each Topical Daily  . metoprolol tartrate  25 mg Oral BID   Continuous Infusions: . amiodarone 30 mg/hr (11/27/19 1700)  . cefTRIAXone (ROCEPHIN)  IV Stopped (11/27/19 0851)  . diltiazem (CARDIZEM) infusion 10 mg/hr (11/27/19 1700)  . heparin 1,500 Units/hr (11/27/19 1700)  . sodium chloride       Anti-infectives (From admission, onward)   Start     Dose/Rate Route Frequency Ordered Stop   11/25/19 0800   cefTRIAXone (ROCEPHIN) 2 g in sodium chloride 0.9 % 100 mL IVPB        2 g 200 mL/hr over 30 Minutes Intravenous Every 24 hours 11/25/19 0544     11/25/19 0600  vancomycin (VANCOREADY) IVPB 750 mg/150 mL  Status:  Discontinued        750 mg 150 mL/hr over 60 Minutes Intravenous Every 12 hours 11/24/19 2029 11/27/19 1525   11/24/19 2200  piperacillin-tazobactam (ZOSYN) IVPB 3.375 g  Status:  Discontinued        3.375 g 12.5 mL/hr over 240 Minutes Intravenous Every 8 hours 11/24/19 2026 11/25/19 0543   11/24/19 2015  piperacillin-tazobactam (ZOSYN) IVPB 3.375 g  Status:  Discontinued        3.375 g 100 mL/hr over 30 Minutes Intravenous  Once 11/24/19 2004 11/24/19 2026   11/24/19 2015  vancomycin (VANCOCIN) IVPB 1000 mg/200 mL premix  Status:  Discontinued        1,000 mg 200 mL/hr over 60 Minutes Intravenous  Once 11/24/19 2004 11/24/19 2025   11/24/19 1845  vancomycin (VANCOREADY) IVPB 2000 mg/400 mL        2,000 mg 200 mL/hr over 120 Minutes Intravenous  Once 11/24/19 1748 11/25/19 0130   11/24/19 1745  ceFEPIme (MAXIPIME) 2 g in sodium chloride 0.9 % 100 mL IVPB  Status:  Discontinued        2 g 200  mL/hr over 30 Minutes Intravenous Every 8 hours 11/24/19 1732 11/24/19 2025             Family Communication/Anticipated D/C date and plan/Code Status   DVT prophylaxis:      Code Status: Full Code  Family Communication: Plan discussed with patient Disposition Plan:    Status is: Inpatient  Remains inpatient appropriate because:IV treatments appropriate due to intensity of illness or inability to take PO   Dispo: The patient is from: Home              Anticipated d/c is to: Home              Anticipated d/c date is: 3 days              Patient currently is not medically stable to d/c.           Subjective:   Interval events noted.  He still complains of pain in the lower extremity.  Diarrhea is a little better today.  No chest pain or shortness of breath.   Patient has remained tachycardic on telemetry.  He required IV Cardizem infusion and IV amiodarone overnight  Objective:    Vitals:   11/27/19 1400 11/27/19 1500 11/27/19 1600 11/27/19 1700  BP: 104/81 117/70 119/71 133/76  Pulse: (!) 154 (!) 155 (!) 158 (!) 159  Resp: 20 19 (!) 29 (!) 21  Temp:   98.9 F (37.2 C)   TempSrc:   Oral   SpO2: 100% 95% 100% 97%  Weight:      Height:       No data found.   Intake/Output Summary (Last 24 hours) at 11/27/2019 1746 Last data filed at 11/27/2019 1700 Gross per 24 hour  Intake 3169.77 ml  Output 2800 ml  Net 369.77 ml   Filed Weights   11/24/19 1249 11/27/19 0600  Weight: 113.4 kg (!) 197.3 kg    Exam:  GEN: No acute distress SKIN: Multiple pustular appearing lesions on the left leg.  Open wound with some yellowish drainage near the left medial malleolus.  Multiple small superficial ulcerations on the right leg EYES: No pallor or icterus ENT: MMM CV: Regular rate but tachycardic PULM: Clear to auscultation bilaterally ABD: Soft, obese, nontender CNS: Alert, oriented x3.  Nonfocal EXT: Chronic swelling of bilateral legs from chronic venous insufficiency but left lower extremity is more swollen.  Tenderness of left lower extremity.  No erythema noted.               Data Reviewed:   I have personally reviewed following labs and imaging studies:  Labs: Labs show the following:   Basic Metabolic Panel: Recent Labs  Lab 11/24/19 1720 11/24/19 1720 11/25/19 1426 11/25/19 1426 11/25/19 1938 11/25/19 1938 11/26/19 0505 11/26/19 1512 11/27/19 0426  NA 136  --  135  --  134*  --  134*  --  137  K 4.1   < > 3.2*   < > 3.0*   < > 3.4*  --  3.5  CL 99  --  98  --  97*  --  102  --  103  CO2 26  --  23  --  23  --  18*  --  25  GLUCOSE 140*  --  124*  --  124*  --  109*  --  129*  BUN 15  --  15  --  15  --  15  --  15  CREATININE  1.27*  --  1.30*  --  1.23  --  1.10  --  0.87  CALCIUM 8.4*  --  8.2*  --  7.9*   --  8.2*  --  8.1*  MG  --   --   --   --  2.1  --   --  2.5*  --   PHOS  --   --   --   --   --   --   --  2.3*  --    < > = values in this interval not displayed.   GFR Estimated Creatinine Clearance: 187.6 mL/min (by C-G formula based on SCr of 0.87 mg/dL). Liver Function Tests: Recent Labs  Lab 11/24/19 1720  AST 47*  ALT 41  ALKPHOS 58  BILITOT 1.6*  PROT 8.3*  ALBUMIN 3.2*   No results for input(s): LIPASE, AMYLASE in the last 168 hours. No results for input(s): AMMONIA in the last 168 hours. Coagulation profile Recent Labs  Lab 11/24/19 1720 11/25/19 1426  INR 1.0 1.2    CBC: Recent Labs  Lab 11/24/19 1720 11/25/19 1426 11/26/19 0840 11/27/19 0426  WBC 9.1 9.8 8.6 8.9  NEUTROABS 8.0*  --  7.3 7.4  HGB 12.9* 10.9* 10.2* 9.8*  HCT 42.0 34.4* 33.1* 32.6*  MCV 69.4* 68.3* 68.8* 70.9*  PLT 185 159 142* 157   Cardiac Enzymes: No results for input(s): CKTOTAL, CKMB, CKMBINDEX, TROPONINI in the last 168 hours. BNP (last 3 results) No results for input(s): PROBNP in the last 8760 hours. CBG: Recent Labs  Lab 11/25/19 1913 11/26/19 1655  GLUCAP 122* 103*   D-Dimer: No results for input(s): DDIMER in the last 72 hours. Hgb A1c: No results for input(s): HGBA1C in the last 72 hours. Lipid Profile: No results for input(s): CHOL, HDL, LDLCALC, TRIG, CHOLHDL, LDLDIRECT in the last 72 hours. Thyroid function studies: Recent Labs    11/27/19 0409  TSH 2.156   Anemia work up: No results for input(s): VITAMINB12, FOLATE, FERRITIN, TIBC, IRON, RETICCTPCT in the last 72 hours. Sepsis Labs: Recent Labs  Lab 11/24/19 1720 11/25/19 1426 11/25/19 1938 11/26/19 0505 11/26/19 0840 11/27/19 0426  PROCALCITON  --  4.86  --   --   --   --   WBC 9.1 9.8  --   --  8.6 8.9  LATICACIDVEN 1.9 2.5* 1.7 1.7  --   --     Microbiology Recent Results (from the past 240 hour(s))  Culture, blood (Routine x 2)     Status: None (Preliminary result)   Collection Time:  11/24/19  3:14 PM   Specimen: BLOOD  Result Value Ref Range Status   Specimen Description BLOOD BLOOD RIGHT HAND  Final   Special Requests   Final    BOTTLES DRAWN AEROBIC AND ANAEROBIC Blood Culture results may not be optimal due to an inadequate volume of blood received in culture bottles   Culture   Final    NO GROWTH 3 DAYS Performed at [facility], [address] [place], [place] [address]    Report Status PENDING  Incomplete  Culture, blood (Routine x 2)     Status: Abnormal   Collection Time: 11/24/19  5:20 PM   Specimen: BLOOD  Result Value Ref Range Status   Specimen Description   Final    BLOOD BLOOD RIGHT ARM Performed at [facility], [address] [place], [place] [address]    Special Requests   Final  BOTTLES DRAWN AEROBIC AND ANAEROBIC Blood Culture adequate volume Performed at [facility], [address] [place], [place] [address]    Culture  Setup Time   Final    Organism ID to follow GRAM POSITIVE COCCI IN BOTH AEROBIC AND ANAEROBIC BOTTLES CRITICAL RESULT CALLED TO, READ BACK BY AND VERIFIED WITH: SCOTT HALL 11/25/19 AT 0530 HS Performed at [facility], [address] [place], [place] [address]    Culture STREPTOCOCCUS GROUP G (A)  Final   Report Status 11/27/2019 FINAL  Final   Organism ID, Bacteria STREPTOCOCCUS GROUP G  Final      Susceptibility   Streptococcus group g - MIC*    CLINDAMYCIN RESISTANT Resistant     AMPICILLIN <=0.25 SENSITIVE Sensitive     ERYTHROMYCIN >=8 RESISTANT Resistant     VANCOMYCIN 0.5 SENSITIVE Sensitive     CEFTRIAXONE <=0.12 SENSITIVE Sensitive     LEVOFLOXACIN 0.5 SENSITIVE Sensitive     * STREPTOCOCCUS GROUP G  Blood Culture ID Panel (Reflexed)     Status: Abnormal   Collection Time: 11/24/19  5:20 PM  Result Value Ref Range Status   Enterococcus faecalis NOT DETECTED NOT DETECTED Final   Enterococcus Faecium NOT DETECTED NOT DETECTED Final   Listeria monocytogenes NOT  DETECTED NOT DETECTED Final   Staphylococcus species NOT DETECTED NOT DETECTED Final   Staphylococcus aureus (BCID) NOT DETECTED NOT DETECTED Final   Staphylococcus epidermidis NOT DETECTED NOT DETECTED Final   Staphylococcus lugdunensis NOT DETECTED NOT DETECTED Final   Streptococcus species DETECTED (A) NOT DETECTED Final    Comment: Not Enterococcus species, Streptococcus agalactiae, Streptococcus pyogenes, or Streptococcus pneumoniae. CRITICAL RESULT CALLED TO, READ BACK BY AND VERIFIED WITH: SCOTT HALL 11/25/19 AT 0530 HS    Streptococcus agalactiae NOT DETECTED NOT DETECTED Final   Streptococcus pneumoniae NOT DETECTED NOT DETECTED Final   Streptococcus pyogenes NOT DETECTED NOT DETECTED Final   A.calcoaceticus-baumannii NOT DETECTED NOT DETECTED Final   Bacteroides fragilis NOT DETECTED NOT DETECTED Final   Enterobacterales NOT DETECTED NOT DETECTED Final   Enterobacter cloacae complex NOT DETECTED NOT DETECTED Final   Escherichia coli NOT DETECTED NOT DETECTED Final   Klebsiella aerogenes NOT DETECTED NOT DETECTED Final   Klebsiella oxytoca NOT DETECTED NOT DETECTED Final   Klebsiella pneumoniae NOT DETECTED NOT DETECTED Final   Proteus species NOT DETECTED NOT DETECTED Final   Salmonella species NOT DETECTED NOT DETECTED Final   Serratia marcescens NOT DETECTED NOT DETECTED Final   Haemophilus influenzae NOT DETECTED NOT DETECTED Final   Neisseria meningitidis NOT DETECTED NOT DETECTED Final   Pseudomonas aeruginosa NOT DETECTED NOT DETECTED Final   Stenotrophomonas maltophilia NOT DETECTED NOT DETECTED Final   Candida albicans NOT DETECTED NOT DETECTED Final   Candida auris NOT DETECTED NOT DETECTED Final   Candida glabrata NOT DETECTED NOT DETECTED Final   Candida krusei NOT DETECTED NOT DETECTED Final   Candida parapsilosis NOT DETECTED NOT DETECTED Final   Candida tropicalis NOT DETECTED NOT DETECTED Final   Cryptococcus neoformans/gattii NOT DETECTED NOT DETECTED Final      Comment: Performed at [facility], [address] [place], [place] [address]  SARS Coronavirus 2 by RT PCR (hospital order, performed in [facility] hospital lab) Nasopharyngeal Nasopharyngeal Swab     Status: None   Collection Time: 11/24/19  7:40 PM   Specimen: Nasopharyngeal Swab  Result Value Ref Range Status   SARS Coronavirus 2 NEGATIVE NEGATIVE Final    Comment: (NOTE) SARS-CoV-2  target nucleic acids are NOT DETECTED.  The SARS-CoV-2 RNA is generally detectable in upper and lower respiratory specimens during the acute phase of infection. The lowest concentration of SARS-CoV-2 viral copies this assay can detect is 250 copies / mL. A negative result does not preclude SARS-CoV-2 infection and should not be used as the sole basis for treatment or other patient management decisions.  A negative result may occur with improper specimen collection / handling, submission of specimen other than nasopharyngeal swab, presence of viral mutation(s) within the areas targeted by this assay, and inadequate number of viral copies (<250 copies / mL). A negative result must be combined with clinical observations, patient history, and epidemiological information.  Fact Sheet for Patients:   BoilerBrush.com.cy  Fact Sheet for Healthcare Providers: https://pope.com/  This test is not yet approved or  cleared by the [place] FDA and has been authorized for detection and/or diagnosis of SARS-CoV-2 by FDA under an Emergency Use Authorization (EUA).  This EUA will remain in effect (meaning this test can be used) for the duration of the COVID-19 declaration under Section 564(b)(1) of the Act, 21 U.S.C. section 360bbb-3(b)(1), unless the authorization is terminated or revoked sooner.  Performed at [facility], [address] [place], [place] [address]   C Difficile Quick Screen w PCR reflex     Status: None   Collection  Time: 11/25/19 10:04 AM   Specimen: STOOL  Result Value Ref Range Status   C Diff antigen NEGATIVE NEGATIVE Final   C Diff toxin NEGATIVE NEGATIVE Final   C Diff interpretation No C. difficile detected.  Final    Comment: Performed at [facility], [address] [place], [place] [address]  MRSA PCR Screening     Status: None   Collection Time: 11/25/19  4:20 PM   Specimen: Nasopharyngeal  Result Value Ref Range Status   MRSA by PCR NEGATIVE NEGATIVE Final    Comment:        The GeneXpert MRSA Assay (FDA approved for NASAL specimens only), is one component of a comprehensive MRSA colonization surveillance program. It is not intended to diagnose MRSA infection nor to guide or monitor treatment for MRSA infections. Performed at [facility], [address] [place], [place] [address]     Procedures and diagnostic studies:  DG Chest 1 View  Result Date: 11/25/2019 CLINICAL DATA:  Shortness of breath EXAM: CHEST  1 VIEW COMPARISON:  11/24/2019 FINDINGS: Single frontal view of the chest demonstrates a stable cardiac silhouette. No airspace disease, effusion, or pneumothorax. No acute bony abnormality. IMPRESSION: 1. Stable exam, no acute process. Electronically Signed   By: Sharlet Salina M.D.   On: 11/25/2019 20:37   CT ANGIO CHEST PE W OR WO CONTRAST  Result Date: 11/26/2019 CLINICAL DATA:  Shortness of breath. Evaluate for pulmonary embolus. Positive D-dimer. EXAM: CT ANGIOGRAPHY CHEST WITH CONTRAST TECHNIQUE: Multidetector CT imaging of the chest was performed using the standard protocol during bolus administration of intravenous contrast. Multiplanar CT image reconstructions and MIPs were obtained to evaluate the vascular anatomy. CONTRAST:  OMNIPAQUE IOHEXOL 350 MG/ML SOLN COMPARISON:  Chest x-ray November 25, 2019 FINDINGS: Cardiovascular: The thoracic aorta is normal in caliber without significant atherosclerotic change and no dissection. The heart size  is borderline. Possible mild coronary artery calcifications. Evaluation of the pulmonary arteries is limited due to respiratory motion resulting in stairstep artifact. Within these limitations, no pulmonary emboli are identified. Mediastinum/Nodes: No enlarged mediastinal, hilar, or axillary  lymph nodes. Thyroid gland, trachea, and esophagus demonstrate no significant findings. Lungs/Pleura: Lungs are clear. No pleural effusion or pneumothorax. Upper Abdomen: No acute abnormality. Musculoskeletal: No chest wall abnormality. No acute or significant osseous findings. Review of the MIP images confirms the above findings. IMPRESSION: 1. Evaluation for pulmonary emboli is limited due to respiratory motion and streak artifact. Within these limitations, no pulmonary emboli are identified. 2. No other acute abnormalities. Electronically Signed   By: Gerome Sam III M.D   On: 11/26/2019 13:02   ECHOCARDIOGRAM COMPLETE  Result Date: 11/27/2019    ECHOCARDIOGRAM REPORT   Patient Name:   KAVONTAE PRITCHARD Date of Exam: 11/27/2019 Medical Rec #:  [id number]            Height:       79.0 in Accession #:    [id number]           Weight:       435.0 lb Date of Birth:  06-23-1965            BSA:          3.171 m Patient Age:    54 years             BP:           154/102 mmHg Patient Gender: M                    HR:           153 bpm. Exam Location:  [facility] Procedure: Color Doppler, Cardiac Doppler and 2D Echo Indications:     Atrial Flutter 427.32  History:         Patient has no prior history of Echocardiogram examinations.                  Tobacco abuse.  Sonographer:     Cristela Blue RDCS (AE) Referring Phys:  [id number] Lurene Shadow Diagnosing Phys: [name] MD  Sonographer Comments: No apical window, suboptimal subcostal window and Technically challenging study due to limited acoustic windows. !0 beat loop used to acquire images due to very rapid heart rate. IMPRESSIONS  1. Left ventricular ejection fraction, by  estimation, is 50 to 55%. The left ventricle has low normal function. Left ventricular endocardial border not optimally defined to evaluate regional wall motion. The left ventricular internal cavity size was mildly dilated. There is moderate left ventricular hypertrophy. Left ventricular diastolic function could not be evaluated.  2. Right ventricular systolic function is normal. The right ventricular size is normal. Tricuspid regurgitation signal is inadequate for assessing PA pressure.  3. Left atrial size was mildly dilated.  4. The mitral valve is normal in structure. No evidence of mitral valve regurgitation. No evidence of mitral stenosis.  5. The aortic valve is normal in structure. Aortic valve regurgitation is not visualized. No aortic stenosis is present.  6. Very challenging study with limited views and significant tachycardia during the exam. FINDINGS  Left Ventricle: Left ventricular ejection fraction, by estimation, is 50 to 55%. The left ventricle has low normal function. Left ventricular endocardial border not optimally defined to evaluate regional wall motion. The left ventricular internal cavity  size was mildly dilated. There is moderate left ventricular hypertrophy. Left ventricular diastolic function could not be evaluated. Right Ventricle: The right ventricular size is normal. No increase in right ventricular wall thickness. Right ventricular systolic function is normal. Tricuspid regurgitation signal is inadequate for assessing PA pressure. Left Atrium: Left atrial  size was mildly dilated. Right Atrium: Right atrial size was normal in size. Pericardium: There is no evidence of pericardial effusion. Mitral Valve: The mitral valve is normal in structure. Normal mobility of the mitral valve leaflets. No evidence of mitral valve regurgitation. No evidence of mitral valve stenosis. Tricuspid Valve: The tricuspid valve is normal in structure. Tricuspid valve regurgitation is not demonstrated. No  evidence of tricuspid stenosis. Aortic Valve: The aortic valve is normal in structure. Aortic valve regurgitation is not visualized. No aortic stenosis is present. Pulmonic Valve: The pulmonic valve was normal in structure. Pulmonic valve regurgitation is not visualized. No evidence of pulmonic stenosis. Aorta: The aortic root is normal in size and structure. Venous: The inferior vena cava was not well visualized. IAS/Shunts: No atrial level shunt detected by color flow Doppler.  LEFT VENTRICLE PLAX 2D LVIDd:         5.59 cm LVIDs:         3.52 cm LV PW:         1.36 cm LV IVS:        1.40 cm LVOT diam:     2.30 cm LVOT Area:     4.15 cm  LEFT ATRIUM         Index LA diam:    4.60 cm 1.45 cm/m                        PULMONIC VALVE AORTA                 PV Vmax:        0.96 m/s Ao Root diam: 3.00 cm PV Peak grad:   3.6 mmHg                       RVOT Peak grad: 4 mmHg   SHUNTS Systemic Diam: 2.30 cm Lorine BearsMuhammad Arida MD Electronically signed by Lorine BearsMuhammad Arida MD Signature Date/Time: 11/27/2019/10:11:21 AM    Final                LOS: 3 days   Demaria Deeney  [facility]   Pager on [email or web address]. If 7PM-7AM, please contact night-coverage at [email or web address]     11/27/2019, 5:46 PM

## 2019-11-27 NOTE — Consult Note (Addendum)
Cardiology Consult    Patient ID: Alfred [name] MRN: [id number], DOB/AGE: [birth year]   Admit date: 11/24/2019 Date of Consult: 11/27/2019  Primary Physician: Patient, No Pcp Per Primary Cardiologist: Alfred Bears, MD Requesting Provider: B. Myriam Forehand, MD  Patient Profile    Alfred [name] is a 54 y.o. male with a history of morbid obesity, tobacco abuse, and lower extremity edema, who is being seen today for the evaluation of atrial flutter with rapid ventricular response and elevated high-sensitivity troponin at the request of Dr. Myriam [name].  Past Medical History   Past Medical History:  Diagnosis Date  . Lower extremity edema   . Morbid obesity (HCC) 11/27/2019  . Tobacco abuse 11/27/2019    Past Surgical History:  Procedure Laterality Date  . DEBRIDEMENT OF ABDOMINAL WALL ABSCESS       Allergies  No Known Allergies  History of Present Illness    [name] with a history of morbid obesity, tobacco abuse, and lower extremity swelling.  He does not routinely seek medical care and is unaware of any other past medical history.  He does not take any medications on a routine basis at home.  He smokes about half a pack of cigarettes per week.  He lives locally with family and does not routinely exercise.  He was in his usual state of health until approximately 2 weeks ago when he began to notice significant bilateral lower extremity swelling with blistering and weeping.  He did not initially seek attention but then on this past Friday, August 20, he developed diarrhea and presented to the [facility] emergency department.  Here, Covid screen was negative.  He was found to have a low-grade fever at 99 and was in sinus tachycardia in the 120s.  Creatinine was mildly elevated above prior baseline at 1.27.  Urinalysis notable for proteinuria.  Chest x-ray without active disease.  Lower extremity Dopplers negative for DVT.  He was admitted and placed on  broad-spectrum antibiotics.  On August 22, he developed more rapid tachycardia, with rates into the 150s.  Twelve-lead ECG demonstrated atrial flutter.  He was transferred to the ICU and placed on IV diltiazem and amiodarone.  He was also given as needed digoxin.  In the setting of tachycardia, high-sensitivity troponin is mildly elevated at 27  27.  CTA of the chest was negative for PE.  He denies any symptoms at present.  Further, he denies any prior history of chest pain, dyspnea, or palpitations.  Inpatient Medications    . Chlorhexidine Gluconate Cloth  6 each Topical Daily  . metoprolol tartrate      . metoprolol tartrate  25 mg Oral BID  . potassium chloride  40 mEq Oral Once   . amiodarone    . cefTRIAXone (ROCEPHIN)  IV 2 g (11/27/19 0821)  . diltiazem (CARDIZEM) infusion 10 mg/hr (11/27/19 0800)  . lactated ringers with kcl 75 mL/hr at 11/27/19 0800  . sodium chloride    . vancomycin Stopped (11/27/19 0713)    Family History    Family History  Problem Relation Age of Onset  . Heart disease Mother        died in her 41's  . Heart disease Father        died in his 51's   He indicated that his mother is deceased. He indicated that his father is deceased.   Social History    Social History   Socioeconomic History  . Marital status: Single  Spouse name: Not on file  . Number of children: Not on file  . Years of education: Not on file  . Highest education level: Not on file  Occupational History  . Not on file  Tobacco Use  . Smoking status: Current Every Day Smoker    Packs/day: 0.10    Types: Cigarettes  . Smokeless tobacco: Never Used  . Tobacco comment: smokes 1/2 pack per week.  Substance and Sexual Activity  . Alcohol use: Yes    Comment: 1 beer/month  . Drug use: Yes    Types: Marijuana    Comment: denies this 11/27/2019  . Sexual activity: Not on file  Other Topics Concern  . Not on file  Social History Narrative   Lives in [place] w/ family.   Currently unemployed.  Does not routinely exercise.   Social Determinants of Health   Financial Resource Strain:   . Difficulty of Paying Living Expenses: Not on file  Food Insecurity:   . Worried About Programme researcher, broadcasting/film/videounning Out of Food in the Last Year: Not on file  . Ran Out of Food in the Last Year: Not on file  Transportation Needs:   . Lack of Transportation (Medical): Not on file  . Lack of Transportation (Non-Medical): Not on file  Physical Activity:   . Days of Exercise per Week: Not on file  . Minutes of Exercise per Session: Not on file  Stress:   . Feeling of Stress : Not on file  Social Connections:   . Frequency of Communication with Friends and Family: Not on file  . Frequency of Social Gatherings with Friends and Family: Not on file  . Attends Religious Services: Not on file  . Active Member of Clubs or Organizations: Not on file  . Attends BankerClub or Organization Meetings: Not on file  . Marital Status: Not on file  Intimate Partner Violence:   . Fear of Current or Ex-Partner: Not on file  . Emotionally Abused: Not on file  . Physically Abused: Not on file  . Sexually Abused: Not on file     Review of Systems    General:  No chills, +++ subj fever, no night sweats or weight changes.  Cardiovascular:  No chest pain, dyspnea on exertion, +++ bilat LE edema, orthopnea, palpitations, paroxysmal nocturnal dyspnea. Dermatological: No rash, lesions/masses Respiratory: No cough, dyspnea Urologic: No hematuria, dysuria Abdominal:   No nausea, vomiting, +++ diarrhea since 8/20, no bright red blood per rectum, melena, or hematemesis Neurologic:  No visual changes, wkns, changes in mental status. All other systems reviewed and are otherwise negative except as noted above.  Physical Exam    Blood pressure (!) 154/102, pulse (!) 153, temperature 98.3 F (36.8 C), temperature source Oral, resp. rate (!) 22, height 6\' 7"  (2.007 m), weight (!) 197.3 kg, SpO2 96 %.  General: Pleasant,  NAD Psych: Normal affect. Neuro: Alert and oriented X 3. Moves all extremities spontaneously. HEENT: Normal  Neck: Supple obese, difficult to gauge JVP.  No bruits.  Lungs:  Resp regular and unlabored, diminished breath sounds bilaterally. Heart: RRR, distant, tachycardic, no s3, s4, or murmurs. Abdomen: Obese, soft, non-tender, non-distended, BS + x 4.  Extremities: No clubbing, cyanosis.  Left greater than right 2-3+ bilateral lower extremity edema. DP/PT/Radials 1+ and equal bilaterally.  Labs    Cardiac Enzymes Recent Labs  Lab 11/26/19 1811 11/26/19 2000  TROPONINIHS 27* 27*      Lab Results  Component Value Date   WBC 8.9  11/27/2019   HGB 9.8 (L) 11/27/2019   HCT 32.6 (L) 11/27/2019   MCV 70.9 (L) 11/27/2019   PLT 157 11/27/2019    Recent Labs  Lab 11/24/19 1720 11/25/19 1426 11/27/19 0426  NA 136   < > 137  K 4.1   < > 3.5  CL 99   < > 103  CO2 26   < > 25  BUN 15   < > 15  CREATININE 1.27*   < > 0.87  CALCIUM 8.4*   < > 8.1*  PROT 8.3*  --   --   BILITOT 1.6*  --   --   ALKPHOS 58  --   --   ALT 41  --   --   AST 47*  --   --   GLUCOSE 140*   < > 129*   < > = values in this interval not displayed.    Radiology Studies    DG Chest 1 View  Result Date: 11/25/2019 CLINICAL DATA:  Shortness of breath EXAM: CHEST  1 VIEW COMPARISON:  11/24/2019 FINDINGS: Single frontal view of the chest demonstrates a stable cardiac silhouette. No airspace disease, effusion, or pneumothorax. No acute bony abnormality. IMPRESSION: 1. Stable exam, no acute process. Electronically Signed   By: Sharlet Salina M.D.   On: 11/25/2019 20:37   CT ANGIO CHEST PE W OR WO CONTRAST  Result Date: 11/26/2019 CLINICAL DATA:  Shortness of breath. Evaluate for pulmonary embolus. Positive D-dimer. EXAM: CT ANGIOGRAPHY CHEST WITH CONTRAST TECHNIQUE: Multidetector CT imaging of the chest was performed using the standard protocol during bolus administration of intravenous contrast. Multiplanar CT  image reconstructions and MIPs were obtained to evaluate the vascular anatomy. CONTRAST:  OMNIPAQUE IOHEXOL 350 MG/ML SOLN COMPARISON:  Chest x-ray November 25, 2019 FINDINGS: Cardiovascular: The thoracic aorta is normal in caliber without significant atherosclerotic change and no dissection. The heart size is borderline. Possible mild coronary artery calcifications. Evaluation of the pulmonary arteries is limited due to respiratory motion resulting in stairstep artifact. Within these limitations, no pulmonary emboli are identified. Mediastinum/Nodes: No enlarged mediastinal, hilar, or axillary lymph nodes. Thyroid gland, trachea, and esophagus demonstrate no significant findings. Lungs/Pleura: Lungs are clear. No pleural effusion or pneumothorax. Upper Abdomen: No acute abnormality. Musculoskeletal: No chest wall abnormality. No acute or significant osseous findings. Review of the MIP images confirms the above findings. IMPRESSION: 1. Evaluation for pulmonary emboli is limited due to respiratory motion and streak artifact. Within these limitations, no pulmonary emboli are identified. 2. No other acute abnormalities. Electronically Signed   By: Gerome Sam III M.D   On: 11/26/2019 13:02   US Venous Img Lower Bilateral  Result Date: 11/24/2019 CLINICAL DATA:  Swelling, pain and discoloration the bilateral lower extremities EXAM: BILATERAL LOWER EXTREMITY VENOUS DOPPLER ULTRASOUND TECHNIQUE: Gray-scale sonography with compression, as well as color and duplex ultrasound, were performed to evaluate the deep venous system(s) from the level of the common femoral vein through the popliteal and proximal calf veins. COMPARISON:  None FINDINGS: VENOUS Markedly suboptimal assessment bilateral lower extremities due to patient intolerance and pain with scanning procedure particularly at the level of the common femoral, saphenofemoral junctions and profundus femoral veins. Normal color flow and response to  augmentation though waveforms are significantly degraded and compressibility is incompletely assessed. Normal color flow, compressibility and response to augmentation of the bilateral femoral and popliteal veins. Additional limitations with the bilateral calf veins poorly visualized due to patient body habitus and  severe lower extremity soft tissue edema seen on grayscale color Doppler ultrasound. OTHER Multiple prominent lymph nodes present in the bilateral groins, largest in the groin measuring up to 2.4 cm short axis albeit with preserved nodal architecture and thickened cortices favoring reactive or edematous change. Limitations: Body habitus and inability to tolerate compression. IMPRESSION: Suboptimal evaluation with imaging limitations as detailed above. Incomplete assessment of the bilateral common femoral, saphenofemoral profundus femoral veins due to lack of compression imaging. Calf veins are poorly visualized as well due to edema and body habitus. No demonstrable femoropopliteal DVT. If clinical symptoms are inconsistent or if there are persistent or worsening symptoms, further imaging (possibly involving the iliac veins) may be warranted. Electronically Signed   By: Kreg Shropshire M.D.   On: 11/24/2019 21:09   DG Chest Port 1 View  Result Date: 11/24/2019 CLINICAL DATA:  54 year old male with sepsis. EXAM: PORTABLE CHEST 1 VIEW COMPARISON:  Chest radiograph dated 06/21/2017. FINDINGS: The heart size and mediastinal contours are within normal limits. Both lungs are clear. The visualized skeletal structures are unremarkable. IMPRESSION: No active disease. Electronically Signed   By: Elgie Collard M.D.   On: 11/24/2019 16:41    ECG & Cardiac Imaging    Aflutter, 157, no acute ST/T changes  - personally reviewed.  Assessment & Plan    1.  Atrial flutter with rapid ventricular response: Patient admitted with 2-week history of increasing lower extremity swelling followed by development of  diarrhea on August 20 and has been treated for left lower extremity cellulitis.  He was in sinus tachycardia on arrival however, in August 22, he developed rates into the 150s and ECG is consistent with atrial flutter at a rate of 157.  No acute ST or T changes.  High-sensitivity troponins are minimally elevated at 27x2.  He is completely asymptomatic.  He has been placed on IV diltiazem and amiodarone which up to this point have not really touched his rate.  He is hypertensive and thus CHA2DS2-VASc is at least 1, though possible mild coronary atherosclerosis also noted on CT angio of the chest.  I will add heparin.  Given hemodynamic stability and asymptomatic nature, will follow heart rates on intravenous medications and consider TEE and direct-current cardioversion in the a.m. (based on vital signs reporting, initial rise in heart rate appears to occur on August 21 at 7 PM, at which time rate jumped from 110  146 - he has not been anticoagulated up to this point).  Continue oral beta-blocker.  Follow-up TSH and echo.  2.  Left lower extremity cellulitis/Strep bacteremia: Afebrile since last night.  Antibiotic therapy per internal medicine.    3.  Hypertension: No prior history however, pressures are trending in the 150s to 170s.  Continue beta-blocker therapy.  Also currently on IV diltiazem.  4.  Elevated high-sensitivity troponin: Minimal troponin elevation of 27x2 in the setting of atrial flutter/tachycardia.  Suspect demand ischemia.  Follow-up echo.  Can consider ischemic testing as an outpatient if EF normal.  If EF abnormal, will need to consider catheterization.  5.  Microcytic anemia: Guaiac stools.  Follow on heparin.  6.  Hypokalemia: Supplement.  7.  Morbid obesity: Follow-up lipids and A1c.  8.  Hyperglycemia: Follow-up hemoglobin A1c.  Signed, Nicolasa Ducking, NP 11/27/2019, 8:20 AM  For questions or updates, please contact   Please consult [email or web address] for contact info under  Cardiology/STEMI.

## 2019-11-28 ENCOUNTER — Encounter
Admission: EM | Disposition: A | Discharge status: Home Health | Payer: Self-pay | Source: Home / Self Care | Attending: Internal Medicine

## 2019-11-28 DIAGNOSIS — I5033 Acute on chronic diastolic (congestive) heart failure: Secondary | ICD-10-CM

## 2019-11-28 DIAGNOSIS — I480 Paroxysmal atrial fibrillation: Secondary | ICD-10-CM

## 2019-11-28 DIAGNOSIS — R7881 Bacteremia: Secondary | ICD-10-CM

## 2019-11-28 LAB — CBC
HCT: 29.9 % — ABNORMAL LOW (ref 39.0–52.0)
HCT: 32.2 % — ABNORMAL LOW (ref 39.0–52.0)
Hemoglobin: 9.1 g/dL — ABNORMAL LOW (ref 13.0–17.0)
Hemoglobin: 9.9 g/dL — ABNORMAL LOW (ref 13.0–17.0)
MCH: 21.3 pg — ABNORMAL LOW (ref 26.0–34.0)
MCH: 21.6 pg — ABNORMAL LOW (ref 26.0–34.0)
MCHC: 30.4 g/dL (ref 30.0–36.0)
MCHC: 30.7 g/dL (ref 30.0–36.0)
MCV: 70 fL — ABNORMAL LOW (ref 80.0–100.0)
MCV: 70.2 fL — ABNORMAL LOW (ref 80.0–100.0)
Platelets: 188 10*3/uL (ref 150–400)
Platelets: 240 10*3/uL (ref 150–400)
RBC: 4.27 MIL/uL (ref 4.22–5.81)
RBC: 4.59 MIL/uL (ref 4.22–5.81)
RDW: 16.8 % — ABNORMAL HIGH (ref 11.5–15.5)
RDW: 16.7 % — ABNORMAL HIGH (ref 11.5–15.5)
WBC: 10.2 10*3/uL (ref 4.0–10.5)
WBC: 12 10*3/uL — ABNORMAL HIGH (ref 4.0–10.5)
nRBC: 0.5 % — ABNORMAL HIGH (ref 0.0–0.2)
nRBC: 0.4 % — ABNORMAL HIGH (ref 0.0–0.2)

## 2019-11-28 LAB — BASIC METABOLIC PANEL
Anion gap: 10 (ref 5–15)
BUN: 14 mg/dL (ref 6–20)
CO2: 25 mmol/L (ref 22–32)
Calcium: 8 mg/dL — ABNORMAL LOW (ref 8.9–10.3)
Chloride: 103 mmol/L (ref 98–111)
Creatinine, Ser: 0.76 mg/dL (ref 0.61–1.24)
GFR calc Af Amer: >60 mL/min (ref >60)
GFR calc non Af Amer: >60 mL/min (ref >60)
Glucose, Bld: 141 mg/dL — ABNORMAL HIGH (ref 70–99)
Potassium: 3.6 mmol/L (ref 3.5–5.1)
Sodium: 138 mmol/L (ref 135–145)

## 2019-11-28 LAB — LIPID PANEL
Cholesterol: 108 mg/dL (ref 0–200)
HDL: 18 mg/dL — ABNORMAL LOW (ref >40)
LDL Cholesterol: 58 mg/dL (ref 0–99)
Total CHOL/HDL Ratio: 6 RATIO
Triglycerides: 159 mg/dL — ABNORMAL HIGH (ref <150)
VLDL: 32 mg/dL (ref 0–40)

## 2019-11-28 LAB — HEPARIN LEVEL (UNFRACTIONATED)
Heparin Unfractionated: 0.11 IU/mL — ABNORMAL LOW (ref 0.30–0.70)
Heparin Unfractionated: <0.1 IU/mL — ABNORMAL LOW (ref 0.30–0.70)
Heparin Unfractionated: 0.15 IU/mL — ABNORMAL LOW (ref 0.30–0.70)

## 2019-11-28 LAB — HEMOGLOBIN A1C
Hgb A1c MFr Bld: 6.9 % — ABNORMAL HIGH (ref 4.8–5.6)
Mean Plasma Glucose: 151 mg/dL

## 2019-11-28 SURGERY — CARDIOVERSION
Anesthesia: Moderate Sedation

## 2019-11-28 MED ORDER — FUROSEMIDE 10 MG/ML IJ SOLN
80.0000 mg | Freq: Two times a day (BID) | INTRAMUSCULAR | Status: DC
  Administered 2019-11-28 – 2019-12-04 (×14): 80 mg via INTRAVENOUS
  Filled 2019-11-28 (×15): qty 8

## 2019-11-28 MED ORDER — LIDOCAINE VISCOUS HCL 2 % MT SOLN
OROMUCOSAL | Status: AC
  Filled 2019-11-28: qty 15

## 2019-11-28 MED ORDER — SODIUM CHLORIDE FLUSH 0.9 % IV SOLN
INTRAVENOUS | Status: AC
  Filled 2019-11-28: qty 20

## 2019-11-28 MED ORDER — METOPROLOL TARTRATE 50 MG PO TABS
50.0000 mg | ORAL_TABLET | Freq: Two times a day (BID) | ORAL | Status: DC
  Administered 2019-11-28 – 2019-11-30 (×5): 50 mg via ORAL
  Filled 2019-11-28 (×5): qty 1

## 2019-11-28 MED ORDER — HEPARIN BOLUS VIA INFUSION
2000.0000 [IU] | Freq: Once | INTRAVENOUS | Status: AC
  Administered 2019-11-28: 2000 [IU] via INTRAVENOUS
  Filled 2019-11-28: qty 2000

## 2019-11-28 MED ORDER — POTASSIUM CHLORIDE CRYS ER 20 MEQ PO TBCR
40.0000 meq | EXTENDED_RELEASE_TABLET | Freq: Every day | ORAL | Status: DC
  Administered 2019-11-28 – 2019-12-04 (×7): 40 meq via ORAL
  Filled 2019-11-28 (×7): qty 2

## 2019-11-28 MED ORDER — SODIUM CHLORIDE 0.9 % IV SOLN
3.0000 g | Freq: Four times a day (QID) | INTRAVENOUS | Status: DC
  Administered 2019-11-29 – 2019-11-30 (×6): 3 g via INTRAVENOUS
  Filled 2019-11-28 (×2): qty 8
  Filled 2019-11-28: qty 3
  Filled 2019-11-28 (×2): qty 8
  Filled 2019-11-28: qty 0.86
  Filled 2019-11-28: qty 3
  Filled 2019-11-28: qty 0.86
  Filled 2019-11-28 (×2): qty 8

## 2019-11-28 NOTE — Progress Notes (Signed)
Progress Note  Patient Name: Alfred [name] Date of Encounter: 11/28/2019  Primary Cardiologist: [name], MD  Subjective   Denies chest pain, shortness of breath, or palpitations.  He converted to sinus rhythm overnight and therefore, TEE and cardioversion have been canceled.  Inpatient Medications    Scheduled Meds: . Chlorhexidine Gluconate Cloth  6 each Topical Daily  . furosemide  80 mg Intravenous BID  . metoprolol tartrate  50 mg Oral BID  . potassium chloride  40 mEq Oral Daily  . sodium chloride flush       Continuous Infusions: . sodium chloride Stopped (11/28/19 0255)  . amiodarone 30 mg/hr (11/28/19 1036)  . [START ON 11/29/2019] ampicillin-sulbactam (UNASYN) IV    . heparin 2,200 Units/hr (11/28/19 1310)  . sodium chloride     PRN Meds: acetaminophen **OR** acetaminophen, guaiFENesin, HYDROcodone-acetaminophen, melatonin, morphine injection, ondansetron **OR** ondansetron (ZOFRAN) IV   Vital Signs    Vitals:   11/28/19 1000 11/28/19 1100 11/28/19 1200 11/28/19 1300  BP: 136/72 138/79 133/62 126/87  Pulse: (!) 102 94 77 86  Resp: (!) 38 (!) 32 (!) 37 (!) 22  Temp:      TempSrc:      SpO2: 97% 99% 100% 100%  Weight:      Height:        Intake/Output Summary (Last 24 hours) at 11/28/2019 1557 Last data filed at 11/28/2019 1038 Gross per 24 hour  Intake 1463.46 ml  Output 4750 ml  Net -3286.54 ml   Filed Weights   11/24/19 1249 11/27/19 0600  Weight: 113.4 kg (!) 197.3 kg    Physical Exam   GEN: Morbidly obese, in no acute distress.  HEENT: Grossly normal.  Neck: Supple, obese, JVP to jaw.  No carotid bruits, or masses. Cardiac: RRR, no murmurs, rubs, or gallops. No clubbing, cyanosis.  3+ bilateral lower extremity edema to the upper thighs.  Lower legs are wrapped.  Radials/ 2+ and equal bilaterally.  Respiratory:  Respirations regular and unlabored, diminished breath sounds bilateral bases. GI: Obese, semifirm with flank edema.   Mild superficial tenderness.  BS + x 4. MS: no deformity or atrophy. Skin: warm and dry, no rash. Neuro:  Strength and sensation are intact. Psych: AAOx3.  Normal affect.  Labs    Chemistry Recent Labs  Lab 11/24/19 1720 11/25/19 1426 11/26/19 0505 11/27/19 0426 11/28/19 0108  NA 136   < > 134* 137 138  K 4.1   < > 3.4* 3.5 3.6  CL 99   < > 102 103 103  CO2 26   < > 18* 25 25  GLUCOSE 140*   < > 109* 129* 141*  BUN 15   < > 15 15 14   CREATININE 1.27*   < > 1.10 0.87 0.76  CALCIUM 8.4*   < > 8.2* 8.1* 8.0*  PROT 8.3*  --   --   --   --   ALBUMIN 3.2*  --   --   --   --   AST 47*  --   --   --   --   ALT 41  --   --   --   --   ALKPHOS 58  --   --   --   --   BILITOT 1.6*  --   --   --   --   GFRNONAA >60   < > >60 >60 >60  GFRAA >60   < > >60 >60 >60  ANIONGAP 11   < > 14 9 10    < > = values in this interval not displayed.     Hematology Recent Labs  Lab 11/27/19 0426 11/28/19 0108 11/28/19 1050  WBC 8.9 10.2 12.0*  RBC 4.60 4.27 4.59  HGB 9.8* 9.1* 9.9*  HCT 32.6* 29.9* 32.2*  MCV 70.9* 70.0* 70.2*  MCH 21.3* 21.3* 21.6*  MCHC 30.1 30.4 30.7  RDW 16.9* 16.8* 16.7*  PLT 157 188 240    Cardiac Enzymes  Recent Labs  Lab 11/26/19 1811 11/26/19 2000  TROPONINIHS 27* 27*      Lipids  Lab Results  Component Value Date   CHOL 108 11/28/2019   HDL 18 (L) 11/28/2019   LDLCALC 58 11/28/2019   TRIG 159 (H) 11/28/2019   CHOLHDL 6.0 11/28/2019    HbA1c  Lab Results  Component Value Date   HGBA1C 6.9 (H) 11/27/2019    Radiology    DG Chest 1 View  Result Date: 11/25/2019 CLINICAL DATA:  Shortness of breath EXAM: CHEST  1 VIEW COMPARISON:  11/24/2019 FINDINGS: Single frontal view of the chest demonstrates a stable cardiac silhouette. No airspace disease, effusion, or pneumothorax. No acute bony abnormality. IMPRESSION: 1. Stable exam, no acute process. Electronically Signed   By: 11/26/2019 M.D.   On: 11/25/2019 20:37   CT ANGIO CHEST PE W OR WO  CONTRAST  Result Date: 11/26/2019 CLINICAL DATA:  Shortness of breath. Evaluate for pulmonary embolus. Positive D-dimer. EXAM: CT ANGIOGRAPHY CHEST WITH CONTRAST TECHNIQUE: Multidetector CT imaging of the chest was performed using the standard protocol during bolus administration of intravenous contrast. Multiplanar CT image reconstructions and MIPs were obtained to evaluate the vascular anatomy. CONTRAST:  11/28/2019 OMNIPAQUE IOHEXOL 350 MG/ML SOLN COMPARISON:  Chest x-ray November 25, 2019 FINDINGS: Cardiovascular: The thoracic aorta is normal in caliber without significant atherosclerotic change and no dissection. The heart size is borderline. Possible mild coronary artery calcifications. Evaluation of the pulmonary arteries is limited due to respiratory motion resulting in stairstep artifact. Within these limitations, no pulmonary emboli are identified. Mediastinum/Nodes: No enlarged mediastinal, hilar, or axillary lymph nodes. Thyroid gland, trachea, and esophagus demonstrate no significant findings. Lungs/Pleura: Lungs are clear. No pleural effusion or pneumothorax. Upper Abdomen: No acute abnormality. Musculoskeletal: No chest wall abnormality. No acute or significant osseous findings. Review of the MIP images confirms the above findings. IMPRESSION: 1. Evaluation for pulmonary emboli is limited due to respiratory motion and streak artifact. Within these limitations, no pulmonary emboli are identified. 2. No other acute abnormalities. Electronically Signed   By: November 27, 2019 III M.D   On: 11/26/2019 13:02   11/28/2019 Venous Img Lower Bilateral  Result Date: 11/24/2019 CLINICAL DATA:  Swelling, pain and discoloration the bilateral lower extremities EXAM: BILATERAL LOWER EXTREMITY VENOUS DOPPLER ULTRASOUND TECHNIQUE: Gray-scale sonography with compression, as well as color and duplex ultrasound, were performed to evaluate the deep venous system(s) from the level of the common femoral vein through the popliteal and  proximal calf veins. COMPARISON:  None FINDINGS: VENOUS Markedly suboptimal assessment bilateral lower extremities due to patient intolerance and pain with scanning procedure particularly at the level of the common femoral, saphenofemoral junctions and profundus femoral veins. Normal color flow and response to augmentation though waveforms are significantly degraded and compressibility is incompletely assessed. Normal color flow, compressibility and response to augmentation of the bilateral femoral and popliteal veins. Additional limitations with the bilateral calf veins poorly visualized due to patient body habitus and severe lower extremity  soft tissue edema seen on grayscale color Doppler ultrasound. OTHER Multiple prominent lymph nodes present in the bilateral groins, largest in the groin measuring up to 2.4 cm short axis albeit with preserved nodal architecture and thickened cortices favoring reactive or edematous change. Limitations: Body habitus and inability to tolerate compression. IMPRESSION: Suboptimal evaluation with imaging limitations as detailed above. Incomplete assessment of the bilateral common femoral, saphenofemoral profundus femoral veins due to lack of compression imaging. Calf veins are poorly visualized as well due to edema and body habitus. No demonstrable femoropopliteal DVT. If clinical symptoms are inconsistent or if there are persistent or worsening symptoms, further imaging (possibly involving the iliac veins) may be warranted. Electronically Signed   By: Kreg Shropshire M.D.   On: 11/24/2019 21:09   DG Chest Port 1 View  Result Date: 11/24/2019 CLINICAL DATA:  55 year old male with sepsis. EXAM: PORTABLE CHEST 1 VIEW COMPARISON:  Chest radiograph dated 06/21/2017. FINDINGS: The heart size and mediastinal contours are within normal limits. Both lungs are clear. The visualized skeletal structures are unremarkable. IMPRESSION: No active disease. Electronically Signed   By: Elgie Collard M.D.   On: 11/24/2019 16:41   Telemetry    Converted to sinus rhythm @ 2:21 AM - Personally Reviewed  Cardiac Studies   2D Echocardiogram 8.2021   1. Left ventricular ejection fraction, by estimation, is 50 to 55%. The  left ventricle has low normal function. Left ventricular endocardial  border not optimally defined to evaluate regional wall motion. The left  ventricular internal cavity size was  mildly dilated. There is moderate left ventricular hypertrophy. Left  ventricular diastolic function could not be evaluated.   2. Right ventricular systolic function is normal. The right ventricular  size is normal. Tricuspid regurgitation signal is inadequate for assessing  PA pressure.   3. Left atrial size was mildly dilated.   4. The mitral valve is normal in structure. No evidence of mitral valve  regurgitation. No evidence of mitral stenosis.   5. The aortic valve is normal in structure. Aortic valve regurgitation is  not visualized. No aortic stenosis is present.   6. Very challenging study with limited views and significant tachycardia  during the exam.  _____________    Patient Profile     54 y.o. male-year-old male with a history of morbid obesity, tobacco abuse, lower extremity edema, who was admitted August 20 due to lower extremity edema and subsequently developed atrial flutter on August 21.  He converted to sinus rhythm in the early morning hours of August 24.  Assessment & Plan    1.  Atrial flutter with rapid ventricular spots: He was admitted with a 2-week history of increasing lower extremity swelling followed by development of diarrhea in August 20 and has been treated for left lower extremity cellulitis and bacteremia.  He developed atrial flutter with rapid ventricular response requiring transfer to the ICU and initiation of heparin, amiodarone, and IV diltiazem.  He was originally set up for TEE and cardioversion this morning however, he converted to sinus  rhythm at 221 this morning.  Will continue IV amiodarone for the time being given acute illness and hypertensive for return of atrial arrhythmias.  We have discontinued intravenous diltiazem and titrated metoprolol to 50 mg twice daily.  Provided that he will not require any invasive evaluation, we can transition from heparin to an oral anticoagulant.  2.  Left lower extremity cellulitis/strep bacteremia: Afebrile.  Antibiotic therapy per infectious disease.  He was not felt  to require TEE.  3.  Acute heart failure with preserved ejection fraction: EF 50-55% by echo.  Patient is massively volume overloaded.  We have added Lasix 80 mg IV twice daily.  Heart rate and blood pressure currently stable.  Follow renal function and electrolytes closely.  4.  Elevated high-sensitivity troponin: Minimal troponin elevation 27  27 in the setting of atrial flutter/tachycardia.  Suspect demand ischemia.  EF 50-55%.  No chest pain.  No plan for ischemic evaluation at this time.  He would be a poor candidate for nuclear imaging.  5.  Microcytic anemia: H&H stable.  Fecal occult blood testing ordered.  6.  Hypokalemia: Stable.  Follow with diuresis.  7.  Hyperglycemia: A1c 6.9.  No prior diagnosis of diabetes.  Per internal medicine.  Signed, Nicolasa Duckinghristopher Dorothe Elmore, NP  11/28/2019, 3:57 PM    For questions or updates, please contact   Please consult [email or web address] for contact info under Cardiology/STEMI.

## 2019-11-28 NOTE — Progress Notes (Addendum)
Progress Note    [name]  OZH:[id number] DOB: 22-Sep-1965  DOA: 11/24/2019 PCP: Patient, No Pcp Per      Brief Narrative:    Medical records reviewed and are as summarized below:  Alfred [name] is a 54 y.o. male       Assessment/Plan:   Principal Problem:   Streptococcal sepsis, unspecified (HCC) Active Problems:   Cellulitis of left leg   AKI (acute kidney injury) (HCC)   Streptococcal bacteremia   Lower extremity edema   Morbid obesity (HCC)   Tobacco abuse   Severe strep sepsis (persistent fever and sinus tachycardia) and strep bacteremia secondary to left leg cellulitis AKI (organ dysfunction)-improved Debility/inability to walk Significantly elevated D-dimer (1,759.59)-no evidence of DVT or PE Diarrhea-stool for C. difficile toxin on 11/26/2019 was negative Hypokalemia  PLAN  IV Vanco ceftriaxone have been changed to IV Unasyn by ID IV amiodarone and IV Cardizem infusion have been discontinued because he converted to normal sinus rhythm. He has been started on IV heparin infusion by cardiologist. Plan for possible TEE tomorrow if heart rate fails to improve or fails to convert to NSR Continue IV heparin for now.  Monitor heparin level per protocol Analgesics as needed for pain Follow-up with ID and cardiologist for further recommendations PT and OT evaluation Appreciate wound care nurse's input Transfer to progressive care unit   Body mass index is 49 kg/m.  (Morbid obesity): This complicates overall care and prognosis  Diet Order            Diet heart healthy/carb modified Room service appropriate? Yes; Fluid consistency: Thin  Diet effective now                      Medications:   . Chlorhexidine Gluconate Cloth  6 each Topical Daily  . furosemide  80 mg Intravenous BID  . metoprolol tartrate  50 mg Oral BID  . potassium chloride  40 mEq Oral Daily  . sodium chloride flush       Continuous Infusions: .  sodium chloride Stopped (11/28/19 0255)  . amiodarone 30 mg/hr (11/28/19 1036)  . [START ON 11/29/2019] ampicillin-sulbactam (UNASYN) IV    . heparin 2,200 Units/hr (11/28/19 1310)  . sodium chloride       Anti-infectives (From admission, onward)   Start     Dose/Rate Route Frequency Ordered Stop   11/29/19 0600  Ampicillin-Sulbactam (UNASYN) 3 g in sodium chloride 0.9 % 100 mL IVPB        3 g 200 mL/hr over 30 Minutes Intravenous Every 6 hours 11/28/19 0918     11/25/19 0800  cefTRIAXone (ROCEPHIN) 2 g in sodium chloride 0.9 % 100 mL IVPB  Status:  Discontinued        2 g 200 mL/hr over 30 Minutes Intravenous Every 24 hours 11/25/19 0544 11/28/19 0918   11/25/19 0600  vancomycin (VANCOREADY) IVPB 750 mg/150 mL  Status:  Discontinued        750 mg 150 mL/hr over 60 Minutes Intravenous Every 12 hours 11/24/19 2029 11/27/19 1525   11/24/19 2200  piperacillin-tazobactam (ZOSYN) IVPB 3.375 g  Status:  Discontinued        3.375 g 12.5 mL/hr over 240 Minutes Intravenous Every 8 hours 11/24/19 2026 11/25/19 0543   11/24/19 2015  piperacillin-tazobactam (ZOSYN) IVPB 3.375 g  Status:  Discontinued        3.375 g 100 mL/hr over 30 Minutes Intravenous  Once 11/24/19  2004 11/24/19 2026   11/24/19 2015  vancomycin (VANCOCIN) IVPB 1000 mg/200 mL premix  Status:  Discontinued        1,000 mg 200 mL/hr over 60 Minutes Intravenous  Once 11/24/19 2004 11/24/19 2025   11/24/19 1845  vancomycin (VANCOREADY) IVPB 2000 mg/400 mL        2,000 mg 200 mL/hr over 120 Minutes Intravenous  Once 11/24/19 1748 11/25/19 0130   11/24/19 1745  ceFEPIme (MAXIPIME) 2 g in sodium chloride 0.9 % 100 mL IVPB  Status:  Discontinued        2 g 200 mL/hr over 30 Minutes Intravenous Every 8 hours 11/24/19 1732 11/24/19 2025             Family Communication/Anticipated D/C date and plan/Code Status   DVT prophylaxis:      Code Status: Full Code  Family Communication: Plan discussed with patient Disposition  Plan:    Status is: Inpatient  Remains inpatient appropriate because:IV treatments appropriate due to intensity of illness or inability to take PO   Dispo: The patient is from: Home              Anticipated d/c is to: Home              Anticipated d/c date is: 3 days              Patient currently is not medically stable to d/c.           Subjective:   Interval events noted.  He has converted to normal sinus rhythm.  Diarrhea is better.  No chest pain, shortness of breath.  He still has pain in the left lower extremity.  Objective:    Vitals:   11/28/19 1000 11/28/19 1100 11/28/19 1200 11/28/19 1300  BP: 136/72 138/79 133/62 126/87  Pulse: (!) 102 94 77 86  Resp: (!) 38 (!) 32 (!) 37 (!) 22  Temp:      TempSrc:      SpO2: 97% 99% 100% 100%  Weight:      Height:       No data found.   Intake/Output Summary (Last 24 hours) at 11/28/2019 1429 Last data filed at 11/28/2019 1038 Gross per 24 hour  Intake 1463.46 ml  Output 4750 ml  Net -3286.54 ml   Filed Weights   11/24/19 1249 11/27/19 0600  Weight: 113.4 kg (!) 197.3 kg    Exam:  GEN: He is laying comfortably in bed.  No acute distress SKIN: Multiple pustular appearing lesions on the left leg.  Open wound with some yellowish drainage near the left medial malleolus.  Multiple small superficial ulcerations on the right leg EYES: No pallor or icterus ENT: MMM CV: Regular rate and rhythm PULM: No wheezing or rales heard ABD: Soft and nontender CNS: Alert and oriented.  No focal deficits EXT: Chronic swelling of bilateral legs from chronic venous insufficiency but left lower extremity is more swollen.  Tenderness of bilateral lower extremities from thighs to feet.  No erythema noted.               Data Reviewed:   I have personally reviewed following labs and imaging studies:  Labs: Labs show the following:   Basic Metabolic Panel: Recent Labs  Lab 11/25/19 1426 11/25/19 1426  11/25/19 1938 11/25/19 1938 11/26/19 0505 11/26/19 0505 11/26/19 1512 11/27/19 0426 11/28/19 0108  NA 135  --  134*  --  134*  --   --  137  138  K 3.2*   < > 3.0*   < > 3.4*   < >  --  3.5 3.6  CL 98  --  97*  --  102  --   --  103 103  CO2 23  --  23  --  18*  --   --  25 25  GLUCOSE 124*  --  124*  --  109*  --   --  129* 141*  BUN 15  --  15  --  15  --   --  15 14  CREATININE 1.30*  --  1.23  --  1.10  --   --  0.87 0.76  CALCIUM 8.2*  --  7.9*  --  8.2*  --   --  8.1* 8.0*  MG  --   --  2.1  --   --   --  2.5*  --   --   PHOS  --   --   --   --   --   --  2.3*  --   --    < > = values in this interval not displayed.   GFR Estimated Creatinine Clearance: 204.1 mL/min (by C-G formula based on SCr of 0.76 mg/dL). Liver Function Tests: Recent Labs  Lab 11/24/19 1720  AST 47*  ALT 41  ALKPHOS 58  BILITOT 1.6*  PROT 8.3*  ALBUMIN 3.2*   No results for input(s): LIPASE, AMYLASE in the last 168 hours. No results for input(s): AMMONIA in the last 168 hours. Coagulation profile Recent Labs  Lab 11/24/19 1720 11/25/19 1426  INR 1.0 1.2    CBC: Recent Labs  Lab 11/24/19 1720 11/24/19 1720 11/25/19 1426 11/26/19 0840 11/27/19 0426 11/28/19 0108 11/28/19 1050  WBC 9.1   < > 9.8 8.6 8.9 10.2 12.0*  NEUTROABS 8.0*  --   --  7.3 7.4  --   --   HGB 12.9*   < > 10.9* 10.2* 9.8* 9.1* 9.9*  HCT 42.0   < > 34.4* 33.1* 32.6* 29.9* 32.2*  MCV 69.4*   < > 68.3* 68.8* 70.9* 70.0* 70.2*  PLT 185   < > 159 142* 157 188 240   < > = values in this interval not displayed.   Cardiac Enzymes: No results for input(s): CKTOTAL, CKMB, CKMBINDEX, TROPONINI in the last 168 hours. BNP (last 3 results) No results for input(s): PROBNP in the last 8760 hours. CBG: Recent Labs  Lab 11/25/19 1913 11/26/19 1655  GLUCAP 122* 103*   D-Dimer: No results for input(s): DDIMER in the last 72 hours. Hgb A1c: Recent Labs    11/27/19 0409  HGBA1C 6.9*   Lipid Profile: Recent Labs     11/28/19 0108  CHOL 108  HDL 18*  LDLCALC 58  TRIG 409*  CHOLHDL 6.0   Thyroid function studies: Recent Labs    11/27/19 0409  TSH 2.156   Anemia work up: No results for input(s): VITAMINB12, FOLATE, FERRITIN, TIBC, IRON, RETICCTPCT in the last 72 hours. Sepsis Labs: Recent Labs  Lab 11/24/19 1720 11/24/19 1720 11/25/19 1426 11/25/19 1426 11/25/19 1938 11/26/19 0505 11/26/19 0840 11/27/19 0426 11/28/19 0108 11/28/19 1050  PROCALCITON  --   --  4.86  --   --   --   --   --   --   --   WBC 9.1   < > 9.8   < >  --   --  8.6 8.9 10.2 12.0*  LATICACIDVEN 1.9  --  2.5*  --  1.7 1.7  --   --   --   --    < > = values in this interval not displayed.    Microbiology Recent Results (from the past 240 hour(s))  Culture, blood (Routine x 2)     Status: None (Preliminary result)   Collection Time: 11/24/19  3:14 PM   Specimen: BLOOD  Result Value Ref Range Status   Specimen Description BLOOD BLOOD RIGHT HAND  Final   Special Requests   Final    BOTTLES DRAWN AEROBIC AND ANAEROBIC Blood Culture results may not be optimal due to an inadequate volume of blood received in culture bottles   Culture   Final    NO GROWTH 4 DAYS Performed at [facility], [address] [place], [place] [address]    Report Status PENDING  Incomplete  Culture, blood (Routine x 2)     Status: Abnormal   Collection Time: 11/24/19  5:20 PM   Specimen: BLOOD  Result Value Ref Range Status   Specimen Description   Final    BLOOD BLOOD RIGHT ARM Performed at [facility], [address], [place], [place] [address]    Special Requests   Final    BOTTLES DRAWN AEROBIC AND ANAEROBIC Blood Culture adequate volume Performed at [facility], [address] [place], [place] [address]    Culture  Setup Time   Final    Organism ID to follow GRAM POSITIVE COCCI IN BOTH AEROBIC AND ANAEROBIC BOTTLES CRITICAL RESULT CALLED TO, READ BACK BY AND VERIFIED WITH: SCOTT HALL 11/25/19  AT 0530 HS Performed at [facility], [address] [place], [place] [address]    Culture STREPTOCOCCUS GROUP G (A)  Final   Report Status 11/27/2019 FINAL  Final   Organism ID, Bacteria STREPTOCOCCUS GROUP G  Final      Susceptibility   Streptococcus group g - MIC*    CLINDAMYCIN RESISTANT Resistant     AMPICILLIN <=0.25 SENSITIVE Sensitive     ERYTHROMYCIN >=8 RESISTANT Resistant     VANCOMYCIN 0.5 SENSITIVE Sensitive     CEFTRIAXONE <=0.12 SENSITIVE Sensitive     LEVOFLOXACIN 0.5 SENSITIVE Sensitive     * STREPTOCOCCUS GROUP G  Blood Culture ID Panel (Reflexed)     Status: Abnormal   Collection Time: 11/24/19  5:20 PM  Result Value Ref Range Status   Enterococcus faecalis NOT DETECTED NOT DETECTED Final   Enterococcus Faecium NOT DETECTED NOT DETECTED Final   Listeria monocytogenes NOT DETECTED NOT DETECTED Final   Staphylococcus species NOT DETECTED NOT DETECTED Final   Staphylococcus aureus (BCID) NOT DETECTED NOT DETECTED Final   Staphylococcus epidermidis NOT DETECTED NOT DETECTED Final   Staphylococcus lugdunensis NOT DETECTED NOT DETECTED Final   Streptococcus species DETECTED (A) NOT DETECTED Final    Comment: Not Enterococcus species, Streptococcus agalactiae, Streptococcus pyogenes, or Streptococcus pneumoniae. CRITICAL RESULT CALLED TO, READ BACK BY AND VERIFIED WITH: SCOTT HALL 11/25/19 AT 0530 HS    Streptococcus agalactiae NOT DETECTED NOT DETECTED Final   Streptococcus pneumoniae NOT DETECTED NOT DETECTED Final   Streptococcus pyogenes NOT DETECTED NOT DETECTED Final   A.calcoaceticus-baumannii NOT DETECTED NOT DETECTED Final   Bacteroides fragilis NOT DETECTED NOT DETECTED Final   Enterobacterales NOT DETECTED NOT DETECTED Final   Enterobacter cloacae complex NOT DETECTED NOT DETECTED Final   Escherichia coli NOT DETECTED NOT DETECTED Final   Klebsiella aerogenes NOT DETECTED NOT DETECTED  Final   Klebsiella oxytoca NOT DETECTED NOT DETECTED Final    Klebsiella pneumoniae NOT DETECTED NOT DETECTED Final   Proteus species NOT DETECTED NOT DETECTED Final   Salmonella species NOT DETECTED NOT DETECTED Final   Serratia marcescens NOT DETECTED NOT DETECTED Final   Haemophilus influenzae NOT DETECTED NOT DETECTED Final   Neisseria meningitidis NOT DETECTED NOT DETECTED Final   Pseudomonas aeruginosa NOT DETECTED NOT DETECTED Final   Stenotrophomonas maltophilia NOT DETECTED NOT DETECTED Final   Candida albicans NOT DETECTED NOT DETECTED Final   Candida auris NOT DETECTED NOT DETECTED Final   Candida glabrata NOT DETECTED NOT DETECTED Final   Candida krusei NOT DETECTED NOT DETECTED Final   Candida parapsilosis NOT DETECTED NOT DETECTED Final   Candida tropicalis NOT DETECTED NOT DETECTED Final   Cryptococcus neoformans/gattii NOT DETECTED NOT DETECTED Final    Comment: Performed at [facility], [address] [place], [place] [address]  SARS Coronavirus 2 by RT PCR (hospital order, performed in [facility] hospital lab) Nasopharyngeal Nasopharyngeal Swab     Status: None   Collection Time: 11/24/19  7:40 PM   Specimen: Nasopharyngeal Swab  Result Value Ref Range Status   SARS Coronavirus 2 NEGATIVE NEGATIVE Final    Comment: (NOTE) SARS-CoV-2 target nucleic acids are NOT DETECTED.  The SARS-CoV-2 RNA is generally detectable in upper and lower respiratory specimens during the acute phase of infection. The lowest concentration of SARS-CoV-2 viral copies this assay can detect is 250 copies / mL. A negative result does not preclude SARS-CoV-2 infection and should not be used as the sole basis for treatment or other patient management decisions.  A negative result may occur with improper specimen collection / handling, submission of specimen other than nasopharyngeal swab, presence of viral mutation(s) within the areas targeted by this assay, and inadequate number of viral copies (<250 copies / mL). A negative result must be  combined with clinical observations, patient history, and epidemiological information.  Fact Sheet for Patients:   BoilerBrush.com.cyhttps://www.fda.gov/media/136312/download  Fact Sheet for Healthcare Providers: https://pope.com/https://www.fda.gov/media/136313/download  This test is not yet approved or  cleared by the [place] FDA and has been authorized for detection and/or diagnosis of SARS-CoV-2 by FDA under an Emergency Use Authorization (EUA).  This EUA will remain in effect (meaning this test can be used) for the duration of the COVID-19 declaration under Section 564(b)(1) of the Act, 21 U.S.C. section 360bbb-3(b)(1), unless the authorization is terminated or revoked sooner.  Performed at [facility], [address] [place], [place] [address]   C Difficile Quick Screen w PCR reflex     Status: None   Collection Time: 11/25/19 10:04 AM   Specimen: STOOL  Result Value Ref Range Status   C Diff antigen NEGATIVE NEGATIVE Final   C Diff toxin NEGATIVE NEGATIVE Final   C Diff interpretation No C. difficile detected.  Final    Comment: Performed at [facility], [address] [place], [place] [address]  MRSA PCR Screening     Status: None   Collection Time: 11/25/19  4:20 PM   Specimen: Nasopharyngeal  Result Value Ref Range Status   MRSA by PCR NEGATIVE NEGATIVE Final    Comment:        The GeneXpert MRSA Assay (FDA approved for NASAL specimens only), is one component of a comprehensive MRSA colonization surveillance program. It is not intended to diagnose MRSA infection nor to guide or monitor treatment for MRSA infections. Performed at [facility], [address] [place], [place] [address]   Aerobic Culture (superficial specimen)     Status: None (Preliminary result)   Collection Time: 11/27/19  4:50 PM   Specimen: Wound  Result Value Ref Range Status   Specimen Description   Final    WOUND Performed at [facility], [address],  [place], [place] [address]    Special Requests   Final    LEFT LEG/SHIN Performed at [facility], [address] [place], [place] [address]    Gram Stain   Final    RARE WBC PRESENT,BOTH PMN AND MONONUCLEAR MODERATE GRAM POSITIVE COCCI FEW GRAM NEGATIVE RODS    Culture   Final    MODERATE PSEUDOMONAS AERUGINOSA SUSCEPTIBILITIES TO FOLLOW Performed at [facility], [address] [place], [place] [address]    Report Status PENDING  Incomplete  Culture, blood (single) w Reflex to ID Panel     Status: None (Preliminary result)   Collection Time: 11/28/19  1:20 AM   Specimen: BLOOD  Result Value Ref Range Status   Specimen Description BLOOD RIGHT ANTECUBITAL  Final   Special Requests   Final    BOTTLES DRAWN AEROBIC AND ANAEROBIC Blood Culture results may not be optimal due to an inadequate volume of blood received in culture bottles   Culture   Final    NO GROWTH < 12 HOURS Performed at [facility], [address], [place], [place] [address]    Report Status PENDING  Incomplete    Procedures and diagnostic studies:  ECHOCARDIOGRAM COMPLETE  Result Date: 11/27/2019    ECHOCARDIOGRAM REPORT   Patient Name:   RIGBY LEONHARDT Date of Exam: 11/27/2019 Medical Rec #:  [id number]            Height:       79.0 in Accession #:    [id number]           Weight:       435.0 lb Date of Birth:  1965/07/04            BSA:          3.171 m Patient Age:    54 years             BP:           154/102 mmHg Patient Gender: M                    HR:           153 bpm. Exam Location:  [facility] Procedure: Color Doppler, Cardiac Doppler and 2D Echo Indications:     Atrial Flutter 427.32  History:         Patient has no prior history of Echocardiogram examinations.                  Tobacco abuse.  Sonographer:     Cristela Blue RDCS (AE) Referring Phys:  [id number] Lurene Shadow Diagnosing Phys: [name] MD  Sonographer Comments: No apical window, suboptimal subcostal window and Technically  challenging study due to limited acoustic windows. !0 beat loop used to acquire images due to very rapid heart rate. IMPRESSIONS  1. Left ventricular ejection fraction, by estimation, is 50 to 55%. The left ventricle has low normal function. Left ventricular endocardial border not optimally defined to evaluate regional wall motion. The left ventricular internal cavity size was mildly dilated. There is moderate left ventricular hypertrophy. Left ventricular diastolic  function could not be evaluated.  2. Right ventricular systolic function is normal. The right ventricular size is normal. Tricuspid regurgitation signal is inadequate for assessing PA pressure.  3. Left atrial size was mildly dilated.  4. The mitral valve is normal in structure. No evidence of mitral valve regurgitation. No evidence of mitral stenosis.  5. The aortic valve is normal in structure. Aortic valve regurgitation is not visualized. No aortic stenosis is present.  6. Very challenging study with limited views and significant tachycardia during the exam. FINDINGS  Left Ventricle: Left ventricular ejection fraction, by estimation, is 50 to 55%. The left ventricle has low normal function. Left ventricular endocardial border not optimally defined to evaluate regional wall motion. The left ventricular internal cavity  size was mildly dilated. There is moderate left ventricular hypertrophy. Left ventricular diastolic function could not be evaluated. Right Ventricle: The right ventricular size is normal. No increase in right ventricular wall thickness. Right ventricular systolic function is normal. Tricuspid regurgitation signal is inadequate for assessing PA pressure. Left Atrium: Left atrial size was mildly dilated. Right Atrium: Right atrial size was normal in size. Pericardium: There is no evidence of pericardial effusion. Mitral Valve: The mitral valve is normal in structure. Normal mobility of the mitral valve leaflets. No evidence of mitral valve  regurgitation. No evidence of mitral valve stenosis. Tricuspid Valve: The tricuspid valve is normal in structure. Tricuspid valve regurgitation is not demonstrated. No evidence of tricuspid stenosis. Aortic Valve: The aortic valve is normal in structure. Aortic valve regurgitation is not visualized. No aortic stenosis is present. Pulmonic Valve: The pulmonic valve was normal in structure. Pulmonic valve regurgitation is not visualized. No evidence of pulmonic stenosis. Aorta: The aortic root is normal in size and structure. Venous: The inferior vena cava was not well visualized. IAS/Shunts: No atrial level shunt detected by color flow Doppler.  LEFT VENTRICLE PLAX 2D LVIDd:         5.59 cm LVIDs:         3.52 cm LV PW:         1.36 cm LV IVS:        1.40 cm LVOT diam:     2.30 cm LVOT Area:     4.15 cm  LEFT ATRIUM         Index LA diam:    4.60 cm 1.45 cm/m                        PULMONIC VALVE AORTA                 PV Vmax:        0.96 m/s Ao Root diam: 3.00 cm PV Peak grad:   3.6 mmHg                       RVOT Peak grad: 4 mmHg   SHUNTS Systemic Diam: 2.30 cm [name] MD Electronically signed by [name] MD Signature Date/Time: 11/27/2019/10:11:21 AM    Final                LOS: 4 days   Rodriques Badie  [facility]   Pager on [email or web address]. If 7PM-7AM, please contact night-coverage at [email or web address]     11/28/2019, 2:29 PM

## 2019-11-28 NOTE — Evaluation (Signed)
Physical Therapy Evaluation Patient Details Name: Alfred [name] MRN: [id number] DOB: 04/11/1965 Today's Date: 11/28/2019   History of Present Illness  Alfred [name] is a 54yoM who comes to [facility] on 8/20 [name] pain and LEE. PMH: chronic BLE venous statis, hepatits. Pt admitted with BLE cellulitis. Since arrival pt having issues with fever and tachy into 140s-160s. At time of evaluation, pt continues to have significant LEE bilat.  Clinical Impression  Pt admitted with above diagnosis. Pt currently with functional limitations due to the deficits listed below (see "PT Problem List"). Upon entry, pt in bed, awake and agreeable to participate. Pt reports bilat forelegs continue to be quite painful, 7/10 at rest, more grimacing with mobility. Pt requires MaxA+3 for bed mobility this date, heavy physical assist of BLE as well as trunk is required. Pt also able to perform lateral scoot transfers with max effort and modA +2. LLE weak enough to require modA physical assist for completion of exercises. Pt tolerates sitting at EOB x 12-15 minutes fairly well, pain remains stable, trunk endurance not exceeded. Functional mobility assessment demonstrates increased effort/time requirements, poor tolerance, and need for physical assistance, whereas the patient performed these at a higher level of independence PTA. Pt will benefit from skilled PT intervention to increase independence and safety with basic mobility in preparation for discharge to the venue listed below.       Follow Up Recommendations SNF;Supervision for mobility/OOB    Equipment Recommendations       Recommendations for Other Services OT consult     Precautions / Restrictions Precautions Precautions: Fall Precaution Comments: bilat leg wounds, very painful legs Restrictions Weight Bearing Restrictions: No      Mobility  Bed Mobility Overal bed mobility: Needs Assistance Bed Mobility: Sit to Supine;Supine to Sit     Supine to sit:  +2 for safety/equipment;HOB elevated;Max assist Sit to supine: +2 for safety/equipment;Max assist   General bed mobility comments: legs are very heavy, pt unable to move OOB without maxA of each leg; requires trunk support as well.  Transfers Overall transfer level: Needs assistance   Transfers: Lateral/Scoot Transfers          Lateral/Scoot Transfers: Mod assist;+2 safety/equipment General transfer comment: assisted with drawsheet due to weakness  Ambulation/Gait Ambulation/Gait assistance:  (to weak to safely attempt)              Stairs            Wheelchair Mobility    Modified Rankin (Stroke Patients Only)       Balance Overall balance assessment: Needs assistance Sitting-balance support: Single extremity supported;Feet supported Sitting balance-Leahy Scale: Fair     Standing balance support:  (unable to assess)                                 Pertinent Vitals/Pain Pain Assessment: 0-10 Pain Score: 7  Pain Location: bilat legs Pain Descriptors / Indicators: Aching Pain Intervention(s): Limited activity within patient's tolerance;Monitored during session;Premedicated before session    Home Living Family/patient expects to be discharged to:: Private residence Living Arrangements: Other relatives (Lives with 5 other cousins, all adults up to age of 22; 1 is schizophrenic  whom he helps watch over.) Available Help at Discharge: Family Type of Home: House Home Access: Stairs to enter   Secretary/administrator of Steps: 2 Home Layout: One level Home Equipment: None      Prior Function Level of  Independence: Independent         Comments: no balance or mobility impairment at baseline.     Hand Dominance        Extremity/Trunk Assessment   Upper Extremity Assessment Upper Extremity Assessment: Overall WFL for tasks assessed    Lower Extremity Assessment Lower Extremity Assessment: Generalized weakness (significant  weakness and pain; unable to mobilize without assist.)       Communication   Communication: No difficulties  Cognition Arousal/Alertness: Awake/alert Behavior During Therapy: WFL for tasks assessed/performed Overall Cognitive Status: Within Functional Limits for tasks assessed (has difficulty following cues for instruction sometimes, particularly once he has decided he is unable to do something.)                                        General Comments      Exercises General Exercises - Lower Extremity Ankle Circles/Pumps: AROM;Both;15 reps;Supine Long Arc Quad: AROM;Both;10 reps;Seated Heel Slides: 5 reps;Both;AAROM;Limitations Heel Slides Limitations: not tolerated due to pain Hip ABduction/ADduction: AAROM;Both;5 reps Hip Flexion/Marching: AROM;AAROM;Both;10 reps;Seated;Limitations Hip Flexion/Marching Limitations: llimited ROM on Rt; unable to lift on Left pt given maxA at heel   Assessment/Plan    PT Assessment Patient needs continued PT services  PT Problem List Decreased strength;Decreased range of motion;Decreased activity tolerance;Decreased balance;Decreased mobility;Decreased coordination;Decreased cognition;Decreased knowledge of use of DME;Decreased safety awareness;Decreased knowledge of precautions       PT Treatment Interventions DME instruction;Balance training;Gait training;Stair training;Functional mobility training;Therapeutic activities;Therapeutic exercise;Patient/family education    PT Goals (Current goals can be found in the Care Plan section)  Acute Rehab PT Goals Patient Stated Goal: return to PLOF in mobility PT Goal Formulation: With patient Time For Goal Achievement: 12/12/19 Potential to Achieve Goals: Fair    Frequency Min 2X/week   Barriers to discharge Inaccessible home environment pt is incredibly weak and will need extensive rehab for return of function    Co-evaluation               AM-PAC PT "6 Clicks"  Mobility  Outcome Measure Help needed turning from your back to your side while in a flat bed without using bedrails?: Total Help needed moving from lying on your back to sitting on the side of a flat bed without using bedrails?: Total Help needed moving to and from a bed to a chair (including a wheelchair)?: Total Help needed standing up from a chair using your arms (e.g., wheelchair or bedside chair)?: Total Help needed to walk in hospital room?: Total Help needed climbing 3-5 steps with a railing? : Total 6 Click Score: 6    End of Session   Activity Tolerance: Patient limited by fatigue;Patient tolerated treatment well;Patient limited by pain Patient left: in bed;with nursing/sitter in room;with call bell/phone within reach Nurse Communication: Mobility status (positioning in bed) PT Visit Diagnosis: Difficulty in walking, not elsewhere classified (R26.2);Unsteadiness on feet (R26.81);Muscle weakness (generalized) (M62.81)    Time: 7425-9563 PT Time Calculation (min) (ACUTE ONLY): 34 min   Charges:   PT Evaluation $PT Eval Moderate Complexity: 1 Mod PT Treatments $Therapeutic Exercise: 8-22 mins        5:00 PM, 11/28/19 Rosamaria Lints, PT, DPT Physical Therapist - [facility]  [phone] (ASCOM)    Alfred [name] 11/28/2019, 4:56 PM

## 2019-11-28 NOTE — Progress Notes (Addendum)
ANTICOAGULATION CONSULT NOTE  Pharmacy Consult for heparin Indication: atrial flutter  No Known Allergies  Patient Measurements: Height: 6\' 7"  (200.7 cm) Weight: (!) 197.3 kg (434 lb 15.5 oz) IBW/kg (Calculated) : 93.7 Heparin Dosing Weight: 141 kg (heparin adjusted body weight), initially used 113 kg  Vital Signs: Temp: 99 F (37.2 C) (08/24 0000) Temp Source: Oral (08/24 0000) BP: 120/80 (08/24 0200) Pulse Rate: 92 (08/24 0222)  Labs: Recent Labs    11/25/19 1426 11/25/19 1426 11/25/19 1938 11/26/19 0505 11/26/19 0840 11/26/19 0840 11/26/19 1811 11/26/19 2000 11/27/19 0426 11/27/19 1710 11/28/19 0108  HGB 10.9*   < >  --   --  10.2*   < >  --   --  9.8*  --  9.1*  HCT 34.4*   < >  --   --  33.1*  --   --   --  32.6*  --  29.9*  PLT 159   < >  --   --  142*  --   --   --  157  --  188  LABPROT 14.4  --   --   --   --   --   --   --   --   --   --   INR 1.2  --   --   --   --   --   --   --   --   --   --   HEPARINUNFRC  --   --   --   --   --   --   --   --   --  <0.10* 0.11*  CREATININE 1.30*  --    < > 1.10  --   --   --   --  0.87  --  0.76  TROPONINIHS  --   --   --   --   --   --  27* 27*  --   --   --    < > = values in this interval not displayed.    Estimated Creatinine Clearance: 204.1 mL/min (by C-G formula based on SCr of 0.76 mg/dL).   Medical History: Past Medical History:  Diagnosis Date  . Lower extremity edema   . Morbid obesity (HCC) 11/27/2019  . Tobacco abuse 11/27/2019    Assessment: 54 year old patient with strep group G bacteremia. In atrial flutter with RVR on amiodarone and diltiazem. Plan for possible cardioversion. Pharmacy to start on heparin drip.  8/23 1710 HL < 0.1  Goal of Therapy:  Heparin level 0.3-0.7 units/ml Monitor platelets by anticoagulation protocol: Yes   Plan:  08/24 @ 0100 HL 0.11 subtherapeutic. Will avoid bolusing as patient's hgb has been trending down and per RN's tech patient has had a little bit of  bloody stool. Will increase rate to 2000 units/hr and will recheck HL and CBC w/ am labs. Of note patient did convert to NSR per RN. Continue to monitor for s/sx of bleeding in the meantime.  9/24, PharmD, BCPS 11/28/2019,2:40 AM

## 2019-11-28 NOTE — Progress Notes (Signed)
Pt. has temp= 102.0  Tylenol 650 mg p.o. prn given. Reyes Ivan, NP notified. Will continue to monitor closely.

## 2019-11-28 NOTE — Plan of Care (Signed)
Pt. has bilateral lower extremity cellulitis, VS stable, voids adequate CYU; had BM today. Will continue to monitor pt closely.

## 2019-11-28 NOTE — Progress Notes (Signed)
ANTICOAGULATION CONSULT NOTE  Pharmacy Consult for heparin Indication: atrial flutter  No Known Allergies  Patient Measurements: Height: 6\' 7"  (200.7 cm) Weight: (!) 197.3 kg (434 lb 15.5 oz) IBW/kg (Calculated) : 93.7 Heparin Dosing Weight: 141 kg (heparin adjusted body weight), initially used 113 kg  Vital Signs: Temp: 102 F (38.9 C) (08/24 2000) Temp Source: Oral (08/24 2000) BP: 146/76 (08/24 2000) Pulse Rate: 102 (08/24 2000)  Labs: Recent Labs    11/26/19 0505 11/26/19 0840 11/26/19 1811 11/26/19 2000 11/27/19 0426 11/27/19 1710 11/28/19 0108 11/28/19 1050 11/28/19 1955  HGB  --    < >  --   --  9.8*  --  9.1* 9.9*  --   HCT  --    < >  --   --  32.6*  --  29.9* 32.2*  --   PLT  --    < >  --   --  157  --  188 240  --   HEPARINUNFRC  --   --   --   --   --    < > 0.11* <0.10* 0.15*  CREATININE 1.10  --   --   --  0.87  --  0.76  --   --   TROPONINIHS  --   --  27* 27*  --   --   --   --   --    < > = values in this interval not displayed.    Estimated Creatinine Clearance: 204.1 mL/min (by C-G formula based on SCr of 0.76 mg/dL).   Medical History: Past Medical History:  Diagnosis Date   Lower extremity edema    Morbid obesity (HCC) 11/27/2019   Tobacco abuse 11/27/2019    Assessment: 54 year old patient with strep group G bacteremia. In atrial flutter with RVR on amiodarone and diltiazem. Plan for possible cardioversion. Pharmacy to start on heparin drip.  8/23 1710 HL < 0.1 8/24 1050 HL < 0.1  8/24 1955 HL 0.15   Goal of Therapy:  Heparin level 0.3-0.7 units/ml Monitor platelets by anticoagulation protocol: Yes   Plan:  Heparin level is subtherapeutic. Will give heparin bolus of 2000 units x 1 and increase heparin infusion to 2400 units/hr.  With previous guaiac positive stool. Reorder heparin level in 6 hours. CBC with morning labs.    9/24, PharmD, BCPS 11/28/2019,8:29 PM

## 2019-11-28 NOTE — Progress Notes (Signed)
ANTICOAGULATION CONSULT NOTE  Pharmacy Consult for heparin Indication: atrial flutter  No Known Allergies  Patient Measurements: Height: 6\' 7"  (200.7 cm) Weight: (!) 197.3 kg (434 lb 15.5 oz) IBW/kg (Calculated) : 93.7 Heparin Dosing Weight: 141 kg (heparin adjusted body weight), initially used 113 kg  Vital Signs: Temp: 99.8 F (37.7 C) (08/24 0800) Temp Source: Oral (08/24 0800) BP: 136/72 (08/24 1000) Pulse Rate: 102 (08/24 1000)  Labs: Recent Labs    11/25/19 1426 11/25/19 1938 11/26/19 0505 11/26/19 0840 11/26/19 1811 11/26/19 2000 11/27/19 0426 11/27/19 0426 11/27/19 1710 11/28/19 0108 11/28/19 1050  HGB 10.9*  --   --    < >  --   --  9.8*   < >  --  9.1* 9.9*  HCT 34.4*  --   --    < >  --   --  32.6*  --   --  29.9* 32.2*  PLT 159  --   --    < >  --   --  157  --   --  188 240  LABPROT 14.4  --   --   --   --   --   --   --   --   --   --   INR 1.2  --   --   --   --   --   --   --   --   --   --   HEPARINUNFRC  --   --   --   --   --   --   --   --  <0.10* 0.11* <0.10*  CREATININE 1.30*   < > 1.10  --   --   --  0.87  --   --  0.76  --   TROPONINIHS  --   --   --   --  27* 27*  --   --   --   --   --    < > = values in this interval not displayed.    Estimated Creatinine Clearance: 204.1 mL/min (by C-G formula based on SCr of 0.76 mg/dL).   Medical History: Past Medical History:  Diagnosis Date  . Lower extremity edema   . Morbid obesity (HCC) 11/27/2019  . Tobacco abuse 11/27/2019    Assessment: 54 year old patient with strep group G bacteremia. In atrial flutter with RVR on amiodarone and diltiazem. Plan for possible cardioversion. Pharmacy to start on heparin drip.  8/23 1710 HL < 0.1  Goal of Therapy:  Heparin level 0.3-0.7 units/ml Monitor platelets by anticoagulation protocol: Yes   Plan:  8/24 1050 HL < 0.10 subtherapeutic. Hemoglobin seems to have stabilized, platelets improved. With previous guaiac positive stool, will give lower  bolus 2000 units and increase heparin drip to 2200 units/hr. HL ordered for 2000. CBC with morning labs.    2001, PharmD 11/28/2019,12:54 PM

## 2019-11-29 ENCOUNTER — Inpatient Hospital Stay: Payer: Self-pay

## 2019-11-29 DIAGNOSIS — I4892 Unspecified atrial flutter: Secondary | ICD-10-CM

## 2019-11-29 LAB — CBC
HCT: 29.8 % — ABNORMAL LOW (ref 39.0–52.0)
Hemoglobin: 9.6 g/dL — ABNORMAL LOW (ref 13.0–17.0)
MCH: 21.9 pg — ABNORMAL LOW (ref 26.0–34.0)
MCHC: 32.2 g/dL (ref 30.0–36.0)
MCV: 68 fL — ABNORMAL LOW (ref 80.0–100.0)
Platelets: 228 10*3/uL (ref 150–400)
RBC: 4.38 MIL/uL (ref 4.22–5.81)
RDW: 16.4 % — ABNORMAL HIGH (ref 11.5–15.5)
WBC: 14.4 10*3/uL — ABNORMAL HIGH (ref 4.0–10.5)
nRBC: 0.6 % — ABNORMAL HIGH (ref 0.0–0.2)

## 2019-11-29 LAB — BASIC METABOLIC PANEL
Anion gap: 12 (ref 5–15)
BUN: 14 mg/dL (ref 6–20)
CO2: 26 mmol/L (ref 22–32)
Calcium: 8 mg/dL — ABNORMAL LOW (ref 8.9–10.3)
Chloride: 100 mmol/L (ref 98–111)
Creatinine, Ser: 0.92 mg/dL (ref 0.61–1.24)
GFR calc Af Amer: >60 mL/min (ref >60)
GFR calc non Af Amer: >60 mL/min (ref >60)
Glucose, Bld: 132 mg/dL — ABNORMAL HIGH (ref 70–99)
Potassium: 3.6 mmol/L (ref 3.5–5.1)
Sodium: 138 mmol/L (ref 135–145)

## 2019-11-29 LAB — HEPARIN LEVEL (UNFRACTIONATED)
Heparin Unfractionated: 0.17 IU/mL — ABNORMAL LOW (ref 0.30–0.70)
Heparin Unfractionated: <0.1 IU/mL — ABNORMAL LOW (ref 0.30–0.70)
Heparin Unfractionated: 0.3 IU/mL (ref 0.30–0.70)

## 2019-11-29 LAB — HCV RNA QUANT
HCV Quantitative Log: 6.398 log10 IU/mL (ref >1.70)
HCV Quantitative: 2500000 IU/mL (ref >50)

## 2019-11-29 LAB — CULTURE, BLOOD (ROUTINE X 2): Culture: NO GROWTH

## 2019-11-29 MED ORDER — ACETAMINOPHEN 500 MG PO TABS
1000.0000 mg | ORAL_TABLET | Freq: Four times a day (QID) | ORAL | Status: DC | PRN
  Administered 2019-12-01: 1000 mg via ORAL
  Filled 2019-11-29: qty 2

## 2019-11-29 MED ORDER — SODIUM CHLORIDE 0.9 % IV SOLN
INTRAVENOUS | Status: DC | PRN
  Administered 2019-11-29 – 2019-12-07 (×6): 250 mL via INTRAVENOUS
  Administered 2019-12-09 – 2019-12-11 (×3): 10 mL via INTRAVENOUS

## 2019-11-29 MED ORDER — SODIUM CHLORIDE 0.9% FLUSH
10.0000 mL | Freq: Two times a day (BID) | INTRAVENOUS | Status: DC
  Administered 2019-11-29 – 2019-11-30 (×4): 10 mL
  Administered 2019-12-01: 20 mL
  Administered 2019-12-01 – 2019-12-11 (×17): 10 mL

## 2019-11-29 MED ORDER — HEPARIN BOLUS VIA INFUSION
3000.0000 [IU] | Freq: Once | INTRAVENOUS | Status: AC
  Administered 2019-11-29: 3000 [IU] via INTRAVENOUS
  Filled 2019-11-29: qty 3000

## 2019-11-29 MED ORDER — SODIUM CHLORIDE 0.9% FLUSH: 10.0000 mL | INTRAVENOUS | Status: DC | PRN

## 2019-11-29 MED ORDER — ACETAMINOPHEN 650 MG RE SUPP: 650.0000 mg | Freq: Four times a day (QID) | RECTAL | Status: DC | PRN

## 2019-11-29 MED ORDER — HEPARIN BOLUS VIA INFUSION
4000.0000 [IU] | Freq: Once | INTRAVENOUS | Status: AC
  Administered 2019-11-29: 4000 [IU] via INTRAVENOUS
  Filled 2019-11-29: qty 4000

## 2019-11-29 MED ORDER — IPRATROPIUM-ALBUTEROL 0.5-2.5 (3) MG/3ML IN SOLN
3.0000 mL | Freq: Four times a day (QID) | RESPIRATORY_TRACT | Status: DC | PRN
  Administered 2019-11-29: 3 mL via RESPIRATORY_TRACT
  Filled 2019-11-29 (×2): qty 3

## 2019-11-29 NOTE — Hospital Course (Addendum)
Alfred [name] is a 54 y.o. male with history of chronic lower extremity venous stasis for the past 15 years but otherwise no medical history and on no medications who presented to the ED on 11/24/19 with 2-week history of progressively worsening bilateral lower extremity swelling, left greater than right associated with pain and skin breakdown with oozing. Reported subjective fevers.  In the ED, low-grade temperature of 99, tachycardic at 122 bpm, with normal blood pressure.  WBC normal at 9100, lactic acid borderline at 1.9.  Blood work showing creatinine of 1.27 above baseline of 1.16 but with prior baseline around 0.7.  Urinalysis showing proteinuria.  Chest x-ray showed no active disease.  Bilateral lower extremity Doppler negative for DVT bilaterally, but was suboptimal study due to body habitus and edema.    Admitted to hospitalist service.  Blood cultures positive for group G strep.  Being treated with IV antibiotics for cellulitis and bacteremia.

## 2019-11-29 NOTE — Evaluation (Signed)
Occupational Therapy Evaluation Patient Details Name: Alfred [name] MRN: [id number] DOB: 1966/03/08 Today's Date: 11/29/2019    History of Present Illness [name] is a 54yoM who comes to [facility] on 8/20 c pain and LEE. PMH: chronic BLE venous statis, hepatits. Pt admitted with BLE cellulitis. Since arrival pt having issues with fever and tachy into 140s-160s. At time of evaluation, pt continues to have significant LEE bilat.   Clinical Impression   Patient presenting with decreased I in self care,balance, functional mobility/transfers, endurance, strengthening, and safety awareness. Patient reports being independent PTA and living with multiple family members. Patient currently functioning at set up A UB self care, max A bed mobility, and max- total A LB self care. Pt declined transfers this session secondary to B LEs pain. Patient will benefit from acute OT to increase overall independence in the areas of ADLs, functional mobility, and safety awareness in order to safely discharge to next venue of care.    Follow Up Recommendations  SNF    Equipment Recommendations  Other (comment) (defer to next venue of care)    Recommendations for Other Services Other (comment) (none at this time)     Precautions / Restrictions Precautions Precautions: Fall Precaution Comments: bilat leg wounds, very painful legs Restrictions Weight Bearing Restrictions: No      Mobility Bed Mobility Overal bed mobility: Needs Assistance Bed Mobility: Sit to Supine;Supine to Sit     Supine to sit: Max assist Sit to supine: Max assist   General bed mobility comments: Max A for B LEs and trunk. B LEs very painful with movement  Transfers    General transfer comment: pt declined this session    Balance Overall balance assessment: Needs assistance Sitting-balance support: Single extremity supported;Feet supported Sitting balance-Leahy Scale: Fair        ADL either performed or assessed  with clinical judgement   ADL Overall ADL's : Needs assistance/impaired Eating/Feeding: Modified independent   Grooming: Wash/dry hands;Wash/dry face;Oral care;Set up   Upper Body Bathing: Set up;Sitting   Lower Body Bathing: Maximal assistance;Bed level       Lower Body Dressing: Total assistance Lower Body Dressing Details (indicate cue type and reason): to don B socks      General ADL Comments: Pt very limited by painful B LEs     Vision Baseline Vision/History: No visual deficits Patient Visual Report: No change from baseline Vision Assessment?: No apparent visual deficits            Pertinent Vitals/Pain Pain Assessment: 0-10 Pain Score: 8  Pain Location: bilat legs Pain Descriptors / Indicators: Aching;Discomfort;Grimacing Pain Intervention(s): Limited activity within patient's tolerance;Monitored during session;Repositioned     Hand Dominance Right   Extremity/Trunk Assessment Upper Extremity Assessment Upper Extremity Assessment: Generalized weakness   Lower Extremity Assessment Lower Extremity Assessment: Generalized weakness   Cervical / Trunk Assessment Cervical / Trunk Assessment: Normal   Communication Communication Communication: No difficulties   Cognition Arousal/Alertness: Awake/alert Behavior During Therapy: WFL for tasks assessed/performed Overall Cognitive Status: Within Functional Limits for tasks assessed                    Home Living Family/patient expects to be discharged to:: Private residence Living Arrangements: Other relatives Available Help at Discharge: Family Type of Home: House Home Access: Stairs to enter Secretary/administrator of Steps: 2   Home Layout: One level     Bathroom Shower/Tub: Producer, television/film/video: Standard     Home  Equipment: None   Additional Comments: Pt lives with 5 other cousins (all over the age of 57) and one of them has schizophrenia who they all help care for.       Prior Functioning/Environment Level of Independence: Independent                 OT Problem List: Decreased strength;Decreased range of motion;Pain;Decreased activity tolerance;Decreased safety awareness;Impaired balance (sitting and/or standing);Decreased knowledge of use of DME or AE;Decreased knowledge of precautions      OT Treatment/Interventions: Self-care/ADL training;Therapeutic exercise;Therapeutic activities;Energy conservation;DME and/or AE instruction;Patient/family education;Balance training    OT Goals(Current goals can be found in the care plan section) Acute Rehab OT Goals Patient Stated Goal: to move better OT Goal Formulation: With patient Time For Goal Achievement: 12/13/19 Potential to Achieve Goals: Good ADL Goals Pt Will Perform Grooming: standing;with supervision Pt Will Perform Upper Body Dressing: with supervision;standing Pt Will Perform Lower Body Dressing: with min assist;sit to/from stand Pt Will Transfer to Toilet: with min assist;ambulating;bedside commode Pt Will Perform Toileting - Clothing Manipulation and hygiene: with min assist;sit to/from stand  OT Frequency: Min 1X/week   Barriers to D/C: Other (comment)  none known at this time          AM-PAC OT "6 Clicks" Daily Activity     Outcome Measure Help from another person eating meals?: None Help from another person taking care of personal grooming?: None Help from another person toileting, which includes using toliet, bedpan, or urinal?: A Lot Help from another person bathing (including washing, rinsing, drying)?: A Lot Help from another person to put on and taking off regular upper body clothing?: A Little Help from another person to put on and taking off regular lower body clothing?: Total 6 Click Score: 16   End of Session    Activity Tolerance: Patient limited by pain Patient left: in bed;with call bell/phone within reach;with bed alarm set  OT Visit Diagnosis: Muscle weakness  (generalized) (M62.81);Pain Pain - Right/Left:  (bilateral) Pain - part of body: Leg                Time: 1420-1450 OT Time Calculation (min): 30 min Charges:  OT General Charges $OT Visit: 1 Visit OT Evaluation $OT Eval Moderate Complexity: 1 Mod OT Treatments $Self Care/Home Management : 8-22 mins  Jackquline Denmark, MS, OTR/L , CBIS ascom [phone]  11/29/19, 3:55 PM

## 2019-11-29 NOTE — Progress Notes (Signed)
° °  Date of Admission:  11/24/2019     ID: Alfred [name] is a 54 y.o. male Principal Problem:   Streptococcal sepsis, unspecified (HCC) Active Problems:   Cellulitis of left leg   AKI (acute kidney injury) (HCC)   Bacteremia   Lower extremity edema   Morbid obesity (HCC)   Tobacco abuse   Paroxysmal atrial fibrillation (HCC)   Acute on chronic heart failure with preserved ejection fraction (HFpEF) (HCC)   Atrial flutter (HCC)    Subjective: Pt says he is feeling better Out of ICU participated with PT  Medications:   Chlorhexidine Gluconate Cloth  6 each Topical Daily   furosemide  80 mg Intravenous BID   metoprolol tartrate  50 mg Oral BID   potassium chloride  40 mEq Oral Daily   sodium chloride flush  10-40 mL Intracatheter Q12H    Objective: Vital signs in last 24 hours: Temp:  [98.2 F (36.8 C)-102 F (38.9 C)] 99.1 F (37.3 C) (08/25 1545) Pulse Rate:  [85-102] 90 (08/25 1545) Resp:  [18-39] 19 (08/25 1545) BP: (115-156)/(65-95) 142/65 (08/25 1545) SpO2:  [92 %-100 %] 99 % (08/25 1545)  PHYSICAL EXAM:  General: Alert, cooperative, no distress, appears stated age.  Lungs: b/l air entry few crepts Heart: Regular rate and rhythm, no murmur, rub or gallop. Abdomen: Soft, non-tender,not distended. Bowel sounds normal. No masses Extremities: edeamtous-both legs wrapped Lymph: Cervical, supraclavicular normal. Neurologic: Grossly non-focal  Lab Results Recent Labs    11/28/19 0108 11/28/19 0108 11/28/19 1050 11/29/19 0324  WBC 10.2   < > 12.0* 14.4*  HGB 9.1*   < > 9.9* 9.6*  HCT 29.9*   < > 32.2* 29.8*  NA 138  --   --  138  K 3.6  --   --  3.6  CL 103  --   --  100  CO2 25  --   --  26  BUN 14  --   --  14  CREATININE 0.76  --   --  0.92   < > = values in this interval not displayed.  Microbiology: 8/20 streptococcus group G 8/24 culture NG Studies/Results: [place] EKG SITE RITE  Result Date: 11/29/2019 If Site Rite image not attached,  placement could not be confirmed due to current cardiac rhythm.    Assessment/Plan: ?streptococcus Group G bacteremia On ampicillin/sulbactam  Source unclear- ? Legs need to r/o endocarditis- will need TEE_ seen by cardiologist plan is to take him for TEE once CHF better controlled  Bilateral venous edema with punched-out lesions on the right leg and circular lesions on the left leg.  This is unusual for cellulitis and need to rule out HSV or erosive lichen planus. Pseudomonas in culture- currently will do topical treatment with silver. As very likely it is colonization  A. fib -reverted to sinus On Amiodarone  AKI on presentation resolved.  Anemia  Active Hepatitis C infection Viral load of 2.5 million copies Will need Rx as OP  Morbid obesity Discussed the management with patient and care team

## 2019-11-29 NOTE — Progress Notes (Signed)
Spoke with RN Manuella Ghazi concerning the patients need for further access. At this time the patient has two working PIVs and needs more IV access. I am recommending a PICC line to be placed for further access.

## 2019-11-29 NOTE — Progress Notes (Signed)
PROGRESS NOTE    Alfred [name]   ZOX:[id number]  DOB: December 23, 1965  PCP: Patient, No Pcp Per    DOA: 11/24/2019 LOS: 5   Brief Narrative   Alfred [name] Alfred [name] is a 54 y.o. male with history of chronic lower extremity venous stasis for the past 15 years but otherwise no medical history and on no medications who presented to the ED on 11/24/19 with 2-week history of progressively worsening bilateral lower extremity swelling, left greater than right associated with pain and skin breakdown with oozing. Reported subjective fevers.  In the ED, low-grade temperature of 99, tachycardic at 122 bpm, with normal blood pressure.  WBC normal at 9100, lactic acid borderline at 1.9.  Blood work showing creatinine of 1.27 above baseline of 1.16 but with prior baseline around 0.7.  Urinalysis showing proteinuria.  Chest x-ray showed no active disease.  Bilateral lower extremity Doppler negative for DVT bilaterally, but was suboptimal study due to body habitus and edema.    Admitted to hospitalist service.  Blood cultures positive for group G strep.  Being treated with IV antibiotics for cellulitis and bacteremia.       Assessment & Plan   Principal Problem:   Streptococcal sepsis, unspecified (HCC) Active Problems:   Cellulitis of left leg   AKI (acute kidney injury) (HCC)   Bacteremia   Lower extremity edema   Morbid obesity (HCC)   Tobacco abuse   Paroxysmal atrial fibrillation (HCC)   Acute on chronic heart failure with preserved ejection fraction (HFpEF) (HCC)   Atrial flutter (HCC)   Sepsis secondary to Bacteremia secondary to Group G Streptococcus - sepsis POA as evidenced by fever, tachycardia, and known source(s) of infection.  ID is following.  Continue Unasyn (initially was on Vanc Rocephin).  Monitor CBC.  Will need TEE to rule out endocarditis (cardiology wants further diuresis beforehand).  Follow up repeat blood cultures.       Cellulitis of left leg - POA, due to severe  venous stasis.  Antibiotic as above.  Wound care.  PRN analgesics.  AKI (acute kidney injury) - POA, improving.  Monitor BMP.  Lower extremity edema - chronic venous stasis.  Wound care.  Monitor.  PT evaluation.  Paroxysmal atrial fibrillation /  Atrial flutter - Currently sinus rhythm.  CHA2DS2-VASc of 1.  Cardiology following.  Continue heparin gtt, transition to DOAC on discharge.  Amiodarone gtt complete, will start PO tomorrow.  Continue Lopressor 50 mg PO BID.    Acute on chronic HFpEF - presented fluid overloaded, improving with IV diuresis.  Cardiology following.  Continue Lasix 80 mg IV BID.  Monitor I/O's, daily weights.  Low sodium diet.     Morbid obesity - Body mass index is 49 kg/m. Complicates overall care and prognosis.  Tobacco abuse - counseled on cessation.    DVT prophylaxis: on heparin gtt    Diet:  Diet Orders (From admission, onward)    Start     Ordered   11/28/19 0756  Diet heart healthy/carb modified Room service appropriate? Yes; Fluid consistency: Thin  Diet effective now       Question Answer Comment  Diet-HS Snack? Nothing   Room service appropriate? Yes   Fluid consistency: Thin      11/28/19 0755            Code Status: Full Code    Subjective 11/29/19    Patient seen this AM at bedside.  He denies any acute complaints including chest pain, palpitations,  fever/chills, abdominal pain, N/V, SOB.  No acute events reported.   Disposition Plan & Communication   Status is: Inpatient  Remains inpatient appropriate because:IV treatments appropriate due to intensity of illness or inability to take PO   Dispo: The patient is from: Home              Anticipated d/c is to: Home              Anticipated d/c date is: 2 days              Patient currently is not medically stable to d/c.        Family Communication: none at bedside, will attempt to call   Consults, Procedures, Significant Events   Consultants:    Cardiology  Infectious disease  Wound care RN  Procedures:   None  Antimicrobials:   Unasyn    Objective   Vitals:   11/29/19 0826 11/29/19 1123 11/29/19 1513 11/29/19 1545  BP: 132/65 (!) 153/95  (!) 142/65  Pulse: 96 90  90  Resp: 19 18  19   Temp: 98.2 F (36.8 C) 99.5 F (37.5 C)  99.1 F (37.3 C)  TempSrc: Oral Oral  Oral  SpO2: 93% 100% 98% 99%  Weight:      Height:        Intake/Output Summary (Last 24 hours) at 11/29/2019 1728 Last data filed at 11/29/2019 1553 Gross per 24 hour  Intake 1805.82 ml  Output 5300 ml  Net -3494.18 ml   Filed Weights   11/24/19 1249 11/27/19 0600  Weight: 113.4 kg (!) 197.3 kg    Physical Exam:  General exam: awake, alert, no acute distress, obese Respiratory system: CTAB but diminished due to body habitus, no wheezes or rhonchi, normal respiratory effort. Cardiovascular system: normal S1/S2, RRR, bilateral LE edema proximal to leg wraps to at least mid-thigh Gastrointestinal system: soft, NT, ND, no HSM felt, +bowel sounds. Central nervous system: A&O x3. no gross focal neurologic deficits, normal speech Extremities: moves all, normal tone, b/l LE's wrapped Psychiatry: normal mood, congruent affect, judgement and insight appear normal  Labs   Data Reviewed: I have personally reviewed following labs and imaging studies  CBC: Recent Labs  Lab 11/24/19 1720 11/25/19 1426 11/26/19 0840 11/27/19 0426 11/28/19 0108 11/28/19 1050 11/29/19 0324  WBC 9.1   < > 8.6 8.9 10.2 12.0* 14.4*  NEUTROABS 8.0*  --  7.3 7.4  --   --   --   HGB 12.9*   < > 10.2* 9.8* 9.1* 9.9* 9.6*  HCT 42.0   < > 33.1* 32.6* 29.9* 32.2* 29.8*  MCV 69.4*   < > 68.8* 70.9* 70.0* 70.2* 68.0*  PLT 185   < > 142* 157 188 240 228   < > = values in this interval not displayed.   Basic Metabolic Panel: Recent Labs  Lab 11/25/19 1938 11/26/19 0505 11/26/19 1512 11/27/19 0426 11/28/19 0108 11/29/19 0324  NA 134* 134*  --  137 138 138  K  3.0* 3.4*  --  3.5 3.6 3.6  CL 97* 102  --  103 103 100  CO2 23 18*  --  25 25 26   GLUCOSE 124* 109*  --  129* 141* 132*  BUN 15 15  --  15 14 14   CREATININE 1.23 1.10  --  0.87 0.76 0.92  CALCIUM 7.9* 8.2*  --  8.1* 8.0* 8.0*  MG 2.1  --  2.5*  --   --   --  PHOS  --   --  2.3*  --   --   --    GFR: Estimated Creatinine Clearance: 177.4 mL/min (by C-G formula based on SCr of 0.92 mg/dL). Liver Function Tests: Recent Labs  Lab 11/24/19 1720  AST 47*  ALT 41  ALKPHOS 58  BILITOT 1.6*  PROT 8.3*  ALBUMIN 3.2*   No results for input(s): LIPASE, AMYLASE in the last 168 hours. No results for input(s): AMMONIA in the last 168 hours. Coagulation Profile: Recent Labs  Lab 11/24/19 1720 11/25/19 1426  INR 1.0 1.2   Cardiac Enzymes: No results for input(s): CKTOTAL, CKMB, CKMBINDEX, TROPONINI in the last 168 hours. BNP (last 3 results) No results for input(s): PROBNP in the last 8760 hours. HbA1C: Recent Labs    11/27/19 0409  HGBA1C 6.9*   CBG: Recent Labs  Lab 11/25/19 1913 11/26/19 1655  GLUCAP 122* 103*   Lipid Profile: Recent Labs    11/28/19 0108  CHOL 108  HDL 18*  LDLCALC 58  TRIG 341*  CHOLHDL 6.0   Thyroid Function Tests: Recent Labs    11/27/19 0409  TSH 2.156   Anemia Panel: No results for input(s): VITAMINB12, FOLATE, FERRITIN, TIBC, IRON, RETICCTPCT in the last 72 hours. Sepsis Labs: Recent Labs  Lab 11/24/19 1720 11/25/19 1426 11/25/19 1938 11/26/19 0505  PROCALCITON  --  4.86  --   --   LATICACIDVEN 1.9 2.5* 1.7 1.7    Recent Results (from the past 240 hour(s))  Culture, blood (Routine x 2)     Status: None   Collection Time: 11/24/19  3:14 PM   Specimen: BLOOD  Result Value Ref Range Status   Specimen Description BLOOD BLOOD RIGHT HAND  Final   Special Requests   Final    BOTTLES DRAWN AEROBIC AND ANAEROBIC Blood Culture results may not be optimal due to an inadequate volume of blood received in culture bottles   Culture    Final    NO GROWTH 5 DAYS Performed at [facility], [address], [place], [place] [address]    Report Status 11/29/2019 FINAL  Final  Culture, blood (Routine x 2)     Status: Abnormal   Collection Time: 11/24/19  5:20 PM   Specimen: BLOOD  Result Value Ref Range Status   Specimen Description   Final    BLOOD BLOOD RIGHT ARM Performed at [facility], [address] [place], [place] [address]    Special Requests   Final    BOTTLES DRAWN AEROBIC AND ANAEROBIC Blood Culture adequate volume Performed at [facility], [address] [place], [place] [address]    Culture  Setup Time   Final    Organism ID to follow GRAM POSITIVE COCCI IN BOTH AEROBIC AND ANAEROBIC BOTTLES CRITICAL RESULT CALLED TO, READ BACK BY AND VERIFIED WITH: SCOTT HALL 11/25/19 AT 0530 HS Performed at [facility], [address] [place], [place] [address]    Culture STREPTOCOCCUS GROUP G (A)  Final   Report Status 11/27/2019 FINAL  Final   Organism ID, Bacteria STREPTOCOCCUS GROUP G  Final      Susceptibility   Streptococcus group g - MIC*    CLINDAMYCIN RESISTANT Resistant     AMPICILLIN <=0.25 SENSITIVE Sensitive     ERYTHROMYCIN >=8 RESISTANT Resistant     VANCOMYCIN 0.5 SENSITIVE Sensitive     CEFTRIAXONE <=0.12 SENSITIVE Sensitive     LEVOFLOXACIN 0.5 SENSITIVE Sensitive     * STREPTOCOCCUS  GROUP G  Blood Culture ID Panel (Reflexed)     Status: Abnormal   Collection Time: 11/24/19  5:20 PM  Result Value Ref Range Status   Enterococcus faecalis NOT DETECTED NOT DETECTED Final   Enterococcus Faecium NOT DETECTED NOT DETECTED Final   Listeria monocytogenes NOT DETECTED NOT DETECTED Final   Staphylococcus species NOT DETECTED NOT DETECTED Final   Staphylococcus aureus (BCID) NOT DETECTED NOT DETECTED Final   Staphylococcus epidermidis NOT DETECTED NOT DETECTED Final   Staphylococcus lugdunensis NOT DETECTED NOT DETECTED Final   Streptococcus species DETECTED  (A) NOT DETECTED Final    Comment: Not Enterococcus species, Streptococcus agalactiae, Streptococcus pyogenes, or Streptococcus pneumoniae. CRITICAL RESULT CALLED TO, READ BACK BY AND VERIFIED WITH: SCOTT HALL 11/25/19 AT 0530 HS    Streptococcus agalactiae NOT DETECTED NOT DETECTED Final   Streptococcus pneumoniae NOT DETECTED NOT DETECTED Final   Streptococcus pyogenes NOT DETECTED NOT DETECTED Final   A.calcoaceticus-baumannii NOT DETECTED NOT DETECTED Final   Bacteroides fragilis NOT DETECTED NOT DETECTED Final   Enterobacterales NOT DETECTED NOT DETECTED Final   Enterobacter cloacae complex NOT DETECTED NOT DETECTED Final   Escherichia coli NOT DETECTED NOT DETECTED Final   Klebsiella aerogenes NOT DETECTED NOT DETECTED Final   Klebsiella oxytoca NOT DETECTED NOT DETECTED Final   Klebsiella pneumoniae NOT DETECTED NOT DETECTED Final   Proteus species NOT DETECTED NOT DETECTED Final   Salmonella species NOT DETECTED NOT DETECTED Final   Serratia marcescens NOT DETECTED NOT DETECTED Final   Haemophilus influenzae NOT DETECTED NOT DETECTED Final   Neisseria meningitidis NOT DETECTED NOT DETECTED Final   Pseudomonas aeruginosa NOT DETECTED NOT DETECTED Final   Stenotrophomonas maltophilia NOT DETECTED NOT DETECTED Final   Candida albicans NOT DETECTED NOT DETECTED Final   Candida auris NOT DETECTED NOT DETECTED Final   Candida glabrata NOT DETECTED NOT DETECTED Final   Candida krusei NOT DETECTED NOT DETECTED Final   Candida parapsilosis NOT DETECTED NOT DETECTED Final   Candida tropicalis NOT DETECTED NOT DETECTED Final   Cryptococcus neoformans/gattii NOT DETECTED NOT DETECTED Final    Comment: Performed at [facility], [address] [place], [place] [address]  SARS Coronavirus 2 by RT PCR (hospital order, performed in [facility] hospital lab) Nasopharyngeal Nasopharyngeal Swab     Status: None   Collection Time: 11/24/19  7:40 PM   Specimen: Nasopharyngeal Swab   Result Value Ref Range Status   SARS Coronavirus 2 NEGATIVE NEGATIVE Final    Comment: (NOTE) SARS-CoV-2 target nucleic acids are NOT DETECTED.  The SARS-CoV-2 RNA is generally detectable in upper and lower respiratory specimens during the acute phase of infection. The lowest concentration of SARS-CoV-2 viral copies this assay can detect is 250 copies / mL. A negative result does not preclude SARS-CoV-2 infection and should not be used as the sole basis for treatment or other patient management decisions.  A negative result may occur with improper specimen collection / handling, submission of specimen other than nasopharyngeal swab, presence of viral mutation(s) within the areas targeted by this assay, and inadequate number of viral copies (<250 copies / mL). A negative result must be combined with clinical observations, patient history, and epidemiological information.  Fact Sheet for Patients:   BoilerBrush.com.cy  Fact Sheet for Healthcare Providers: https://pope.com/  This test is not yet approved or  cleared by the [place] FDA and has been authorized for detection and/or diagnosis of SARS-CoV-2 by FDA under an Emergency Use Authorization (EUA).  This  EUA will remain in effect (meaning this test can be used) for the duration of the COVID-19 declaration under Section 564(b)(1) of the Act, 21 U.S.C. section 360bbb-3(b)(1), unless the authorization is terminated or revoked sooner.  Performed at [facility], [address] [place], [place] [address]   C Difficile Quick Screen w PCR reflex     Status: None   Collection Time: 11/25/19 10:04 AM   Specimen: STOOL  Result Value Ref Range Status   C Diff antigen NEGATIVE NEGATIVE Final   C Diff toxin NEGATIVE NEGATIVE Final   C Diff interpretation No C. difficile detected.  Final    Comment: Performed at [facility], [address] [place], [place]  [address]  MRSA PCR Screening     Status: None   Collection Time: 11/25/19  4:20 PM   Specimen: Nasopharyngeal  Result Value Ref Range Status   MRSA by PCR NEGATIVE NEGATIVE Final    Comment:        The GeneXpert MRSA Assay (FDA approved for NASAL specimens only), is one component of a comprehensive MRSA colonization surveillance program. It is not intended to diagnose MRSA infection nor to guide or monitor treatment for MRSA infections. Performed at [facility], [address], [place], [place] [address]   Aerobic Culture (superficial specimen)     Status: None (Preliminary result)   Collection Time: 11/27/19  4:50 PM   Specimen: Wound  Result Value Ref Range Status   Specimen Description   Final    WOUND Performed at [facility], [address], [place], [place] [address]    Special Requests   Final    LEFT LEG/SHIN Performed at [facility], [address] [place], [place] [address]    Gram Stain   Final    RARE WBC PRESENT,BOTH PMN AND MONONUCLEAR MODERATE GRAM POSITIVE COCCI FEW GRAM NEGATIVE RODS    Culture   Final    MODERATE PSEUDOMONAS AERUGINOSA REPEATIN SENSI CULTURE REINCUBATED FOR BETTER GROWTH Performed at [facility], [address], [place], [place] [address]    Report Status PENDING  Incomplete  Culture, blood (single) w Reflex to ID Panel     Status: None (Preliminary result)   Collection Time: 11/28/19  1:20 AM   Specimen: BLOOD  Result Value Ref Range Status   Specimen Description BLOOD RIGHT ANTECUBITAL  Final   Special Requests   Final    BOTTLES DRAWN AEROBIC AND ANAEROBIC Blood Culture results may not be optimal due to an inadequate volume of blood received in culture bottles   Culture   Final    NO GROWTH 1 DAY Performed at [facility], [address], [place], [place] [address]    Report Status PENDING  Incomplete      Imaging Studies   [place] EKG SITE RITE  Result Date: 11/29/2019 If  Site Rite image not attached, placement could not be confirmed due to current cardiac rhythm.    Medications   Scheduled Meds: . Chlorhexidine Gluconate Cloth  6 each Topical Daily  . furosemide  80 mg Intravenous BID  . metoprolol tartrate  50 mg Oral BID  . potassium chloride  40 mEq Oral Daily  . sodium chloride flush  10-40 mL Intracatheter Q12H   Continuous Infusions: . sodium chloride Stopped (11/28/19 0255)  . sodium chloride Stopped (11/29/19 1519)  . amiodarone 30 mg/hr (11/29/19 1553)  . ampicillin-sulbactam (UNASYN) IV Stopped (11/29/19 1313)  . heparin 2,800  Units/hr (11/29/19 1553)  . sodium chloride         LOS: 5 days    Time spent: 30 minutes    Pennie Banter, DO [facility]  11/29/2019, 5:28 PM    If 7PM-7AM, please contact night-coverage. How to contact the [facility] Attending or Consulting provider 7A - 7P or covering provider during after hours 7P -7A, for this patient?    1. Check the care team in [facility] and look for a) attending/consulting [facility] provider listed and b) the [facility] team listed 2. Log into [email or web address] and use [place]'s universal password to access. If you do not have the password, please contact the hospital operator. 3. Locate the [facility] provider you are looking for under [facility] and page to a number that you can be directly reached. 4. If you still have difficulty reaching the provider, please page the [facility] (Director on Call) for the Hospitalists listed on amion for assistance.

## 2019-11-29 NOTE — Progress Notes (Signed)
Pt temp. Is currently 102.9. Lanora Manis, NP made aware. Will administer Tylenol and recheck temp.

## 2019-11-29 NOTE — Progress Notes (Signed)
ANTICOAGULATION CONSULT NOTE  Pharmacy Consult for heparin Indication: atrial flutter  No Known Allergies  Patient Measurements: Height: 6\' 7"  (200.7 cm) Weight: (!) 197.3 kg (434 lb 15.5 oz) IBW/kg (Calculated) : 93.7 Heparin Dosing Weight: 141 kg (heparin adjusted body weight), initially used 113 kg  Vital Signs: Temp: 99.5 F (37.5 C) (08/25 1123) Temp Source: Oral (08/25 1123) BP: 153/95 (08/25 1123) Pulse Rate: 90 (08/25 1123)  Labs: Recent Labs     0000 11/26/19 1811 11/26/19 2000 11/27/19 0426 11/27/19 1710 11/28/19 0108 11/28/19 0108 11/28/19 1050 11/28/19 1955 11/29/19 0324  HGB   < >  --   --  9.8*  --  9.1*   < > 9.9*  --  9.6*  HCT   < >  --   --  32.6*  --  29.9*  --  32.2*  --  29.8*  PLT   < >  --   --  157  --  188  --  240  --  228  HEPARINUNFRC  --   --   --   --    < > 0.11*   < > <0.10* 0.15* 0.17*  CREATININE  --   --   --  0.87  --  0.76  --   --   --  0.92  TROPONINIHS  --  27* 27*  --   --   --   --   --   --   --    < > = values in this interval not displayed.    Estimated Creatinine Clearance: 177.4 mL/min (by C-G formula based on SCr of 0.92 mg/dL).   Medical History: Past Medical History:  Diagnosis Date  . Lower extremity edema   . Morbid obesity (HCC) 11/27/2019  . Tobacco abuse 11/27/2019    Assessment: 54 year old patient with strep group G bacteremia. In atrial flutter with RVR on amiodarone and diltiazem. Plan for possible cardioversion. Pharmacy to start on heparin drip.  8/23 1710 HL < 0.1 8/24 1050 HL < 0.1  8/24 1955 HL 0.15  8/25 0330 HL 0.17 subtherapeutic 8/25 1352 HL < 0.10  Goal of Therapy:  Heparin level 0.3-0.7 units/ml Monitor platelets by anticoagulation protocol: Yes   Plan:  Will rebolus heparin 4000 units IV x 1 and increase rate to 2800 units/hr and will recheck HL at 2200, h/h trending low will continue to monitor.   9/25, PharmD, BCPS Clinical Pharmacist 11/29/2019 3:12 PM

## 2019-11-29 NOTE — Progress Notes (Signed)
ANTICOAGULATION CONSULT NOTE  Pharmacy Consult for heparin Indication: atrial flutter  No Known Allergies  Patient Measurements: Height: 6\' 7"  (200.7 cm) Weight: (!) 197.3 kg (434 lb 15.5 oz) IBW/kg (Calculated) : 93.7 Heparin Dosing Weight: 141 kg (heparin adjusted body weight), initially used 113 kg  Vital Signs: Temp: 100.7 F (38.2 C) (08/25 0339) Temp Source: Oral (08/24 2332) BP: 156/81 (08/25 0339) Pulse Rate: 96 (08/25 0339)  Labs: Recent Labs    11/26/19 0840 11/26/19 1811 11/26/19 2000 11/27/19 0426 11/27/19 1710 11/28/19 0108 11/28/19 0108 11/28/19 1050 11/28/19 1955 11/29/19 0324  HGB   < >  --   --  9.8*  --  9.1*   < > 9.9*  --  9.6*  HCT   < >  --   --  32.6*  --  29.9*  --  32.2*  --  29.8*  PLT   < >  --   --  157  --  188  --  240  --  228  HEPARINUNFRC  --   --   --   --    < > 0.11*   < > <0.10* 0.15* 0.17*  CREATININE  --   --   --  0.87  --  0.76  --   --   --  0.92  TROPONINIHS  --  27* 27*  --   --   --   --   --   --   --    < > = values in this interval not displayed.    Estimated Creatinine Clearance: 177.4 mL/min (by C-G formula based on SCr of 0.92 mg/dL).   Medical History: Past Medical History:  Diagnosis Date  . Lower extremity edema   . Morbid obesity (HCC) 11/27/2019  . Tobacco abuse 11/27/2019    Assessment: 54 year old patient with strep group G bacteremia. In atrial flutter with RVR on amiodarone and diltiazem. Plan for possible cardioversion. Pharmacy to start on heparin drip.  8/23 1710 HL < 0.1 8/24 1050 HL < 0.1  8/24 1955 HL 0.15   Goal of Therapy:  Heparin level 0.3-0.7 units/ml Monitor platelets by anticoagulation protocol: Yes   Plan:  08/25 @ 0330 HL 0.17 subtherapeutic. Will rebolus heparin 3000 units IV x 1 and increase rate to 2650 units/hr and will recheck HL at 1200, h/h trending low will continue to monitor.   9/25, PharmD, BCPS 11/29/2019,6:08 AM

## 2019-11-29 NOTE — Progress Notes (Signed)
Progress Note  Patient Name: Alfred [name] Date of Encounter: 11/29/2019  [facility] Cardiologist: [name], MD   Subjective   Patient still with some shortness of breath although improved.  Edema has also improved  Inpatient Medications    Scheduled Meds: . Chlorhexidine Gluconate Cloth  6 each Topical Daily  . furosemide  80 mg Intravenous BID  . metoprolol tartrate  50 mg Oral BID  . potassium chloride  40 mEq Oral Daily  . sodium chloride flush  10-40 mL Intracatheter Q12H   Continuous Infusions: . sodium chloride Stopped (11/28/19 0255)  . sodium chloride 250 mL (11/29/19 1256)  . amiodarone 30 mg/hr (11/29/19 1123)  . ampicillin-sulbactam (UNASYN) IV 3 g (11/29/19 1257)  . heparin 2,650 Units/hr (11/29/19 1122)  . sodium chloride     PRN Meds: sodium chloride, acetaminophen **OR** acetaminophen, guaiFENesin, HYDROcodone-acetaminophen, ipratropium-albuterol, melatonin, morphine injection, ondansetron **OR** ondansetron (ZOFRAN) IV, sodium chloride flush   Vital Signs    Vitals:   11/28/19 2332 11/29/19 0339 11/29/19 0826 11/29/19 1123  BP: 139/73 (!) 156/81 132/65 (!) 153/95  Pulse: 85 96 96 90  Resp: 20 20 19 18   Temp: (!) 101.1 F (38.4 C) (!) 100.7 F (38.2 C) 98.2 F (36.8 C) 99.5 F (37.5 C)  TempSrc: Oral  Oral Oral  SpO2: 99%  93% 100%  Weight:      Height:        Intake/Output Summary (Last 24 hours) at 11/29/2019 1429 Last data filed at 11/29/2019 1408 Gross per 24 hour  Intake 1229.1 ml  Output 4950 ml  Net -3720.9 ml   Last 3 Weights 11/27/2019 11/24/2019 12/27/2017  Weight (lbs) 434 lb 15.5 oz 250 lb 275 lb  Weight (kg) 197.3 kg 113.399 kg 124.739 kg      Telemetry    Sinus rhythm- Personally Reviewed  ECG    New tracing obtained- Personally Reviewed  Physical Exam   GEN: No acute distress.   Neck:  Unable to assess Cardiac: RRR, distant heart sounds Respiratory:  Once [place] effort, decreased breath  sounds GI: Soft, nontender, distended  MS: Lower extremity wrapped in Ace bandage.  Unable to assess edema. Neuro:  Nonfocal  Psych: Normal affect   Labs    High Sensitivity Troponin:   Recent Labs  Lab 11/26/19 1811 11/26/19 2000  TROPONINIHS 27* 27*      Chemistry Recent Labs  Lab 11/24/19 1720 11/25/19 1426 11/27/19 0426 11/28/19 0108 11/29/19 0324  NA 136   < > 137 138 138  K 4.1   < > 3.5 3.6 3.6  CL 99   < > 103 103 100  CO2 26   < > 25 25 26   GLUCOSE 140*   < > 129* 141* 132*  BUN 15   < > 15 14 14   CREATININE 1.27*   < > 0.87 0.76 0.92  CALCIUM 8.4*   < > 8.1* 8.0* 8.0*  PROT 8.3*  --   --   --   --   ALBUMIN 3.2*  --   --   --   --   AST 47*  --   --   --   --   ALT 41  --   --   --   --   ALKPHOS 58  --   --   --   --   BILITOT 1.6*  --   --   --   --   GFRNONAA >60   < > >  60 >60 >60  GFRAA >60   < > >60 >60 >60  ANIONGAP 11   < > 9 10 12    < > = values in this interval not displayed.     Hematology Recent Labs  Lab 11/28/19 0108 11/28/19 1050 11/29/19 0324  WBC 10.2 12.0* 14.4*  RBC 4.27 4.59 4.38  HGB 9.1* 9.9* 9.6*  HCT 29.9* 32.2* 29.8*  MCV 70.0* 70.2* 68.0*  MCH 21.3* 21.6* 21.9*  MCHC 30.4 30.7 32.2  RDW 16.8* 16.7* 16.4*  PLT 188 240 228    BNPNo results for input(s): BNP, PROBNP in the last 168 hours.   DDimer No results for input(s): DDIMER in the last 168 hours.   Radiology    12/01/19 EKG SITE RITE  Result Date: 11/29/2019 If [facility] image not attached, placement could not be confirmed due to current cardiac rhythm.   Cardiac Studies   Echo 11/2019 1. Left ventricular ejection fraction, by estimation, is 50 to 55%. The  left ventricle has low normal function. Left ventricular endocardial  border not optimally defined to evaluate regional wall motion. The left  ventricular internal cavity size was  mildly dilated. There is moderate left ventricular hypertrophy. Left  ventricular diastolic function could not be evaluated.   2. Right ventricular systolic function is normal. The right ventricular  size is normal. Tricuspid regurgitation signal is inadequate for assessing  PA pressure.  3. Left atrial size was mildly dilated.  4. The mitral valve is normal in structure. No evidence of mitral valve  regurgitation. No evidence of mitral stenosis.  5. The aortic valve is normal in structure. Aortic valve regurgitation is  not visualized. No aortic stenosis is present.  6. Very challenging study with limited views and significant tachycardia  during the exam.   Patient Profile     54 y.o. male with history of morbid obesity, tobacco abuse, admitted due to edema found to have atrial flutter on 11/2019.  Spontaneously converted to sinus rhythm on 11/2022.  Assessment & Plan    1.  Paroxysmal atrial flutter -Currently in sinus rhythm -CHA2DS2-VASc of 1 (?dchf) -Complete IV amiodarone as scheduled today -Start p.o. amiodarone tomorrow -Cont lopressor 50mg  bid -IV heparin.  Switch to NOAC on discharge.  2.  Edema, fluid overload -net negative 5L, Cr normal -Adequately diuresing with Lasix -Continue IV Lasix 80 twice daily  3. Strep bacteremia -TEE requested by infectious disease to evaluate endocarditis -Plan to schedule TEE after adequate diuresing   Total encounter time 35 minutes  Greater than 50% was spent in counseling and coordination of care with the patient   Signed, 12/2022, MD  11/29/2019, 2:29 PM

## 2019-11-30 ENCOUNTER — Inpatient Hospital Stay: Payer: Self-pay

## 2019-11-30 DIAGNOSIS — N179 Acute kidney failure, unspecified: Secondary | ICD-10-CM

## 2019-11-30 DIAGNOSIS — I483 Typical atrial flutter: Secondary | ICD-10-CM

## 2019-11-30 DIAGNOSIS — R600 Localized edema: Secondary | ICD-10-CM

## 2019-11-30 LAB — CBC WITH DIFFERENTIAL/PLATELET
Abs Immature Granulocytes: 0.98 10*3/uL — ABNORMAL HIGH (ref 0.00–0.07)
Basophils Absolute: 0.1 10*3/uL (ref 0.0–0.1)
Basophils Relative: 0 %
Eosinophils Absolute: 0.1 10*3/uL (ref 0.0–0.5)
Eosinophils Relative: 1 %
HCT: 30.5 % — ABNORMAL LOW (ref 39.0–52.0)
Hemoglobin: 9.5 g/dL — ABNORMAL LOW (ref 13.0–17.0)
Immature Granulocytes: 7 %
Lymphocytes Relative: 8 %
Lymphs Abs: 1.1 10*3/uL (ref 0.7–4.0)
MCH: 21.3 pg — ABNORMAL LOW (ref 26.0–34.0)
MCHC: 31.1 g/dL (ref 30.0–36.0)
MCV: 68.5 fL — ABNORMAL LOW (ref 80.0–100.0)
Monocytes Absolute: 1.1 10*3/uL — ABNORMAL HIGH (ref 0.1–1.0)
Monocytes Relative: 7 %
Neutro Abs: 11.4 10*3/uL — ABNORMAL HIGH (ref 1.7–7.7)
Neutrophils Relative %: 77 %
Platelets: 217 10*3/uL (ref 150–400)
RBC: 4.45 MIL/uL (ref 4.22–5.81)
RDW: 16.3 % — ABNORMAL HIGH (ref 11.5–15.5)
Smear Review: NORMAL
WBC: 14.7 10*3/uL — ABNORMAL HIGH (ref 4.0–10.5)
nRBC: 0.3 % — ABNORMAL HIGH (ref 0.0–0.2)

## 2019-11-30 LAB — HEPARIN LEVEL (UNFRACTIONATED): Heparin Unfractionated: 0.3 IU/mL (ref 0.30–0.70)

## 2019-11-30 MED ORDER — ALBUTEROL SULFATE (2.5 MG/3ML) 0.083% IN NEBU: 2.5000 mg | INHALATION_SOLUTION | RESPIRATORY_TRACT | Status: DC | PRN

## 2019-11-30 MED ORDER — IPRATROPIUM-ALBUTEROL 0.5-2.5 (3) MG/3ML IN SOLN: 3.0000 mL | Freq: Four times a day (QID) | RESPIRATORY_TRACT | Status: DC | PRN

## 2019-11-30 MED ORDER — CEFAZOLIN SODIUM-DEXTROSE 2-4 GM/100ML-% IV SOLN
2.0000 g | Freq: Three times a day (TID) | INTRAVENOUS | Status: DC
  Administered 2019-11-30 – 2019-12-04 (×12): 2 g via INTRAVENOUS
  Filled 2019-11-30 (×20): qty 100

## 2019-11-30 MED ORDER — IPRATROPIUM-ALBUTEROL 0.5-2.5 (3) MG/3ML IN SOLN
3.0000 mL | Freq: Four times a day (QID) | RESPIRATORY_TRACT | Status: DC
  Administered 2019-11-30 (×2): 3 mL via RESPIRATORY_TRACT
  Filled 2019-11-30: qty 3

## 2019-11-30 MED ORDER — CLINDAMYCIN PHOSPHATE 900 MG/50ML IV SOLN
900.0000 mg | Freq: Three times a day (TID) | INTRAVENOUS | Status: DC
  Administered 2019-11-30 – 2019-12-04 (×12): 900 mg via INTRAVENOUS
  Filled 2019-11-30 (×16): qty 50

## 2019-11-30 MED ORDER — METOPROLOL TARTRATE 25 MG PO TABS
25.0000 mg | ORAL_TABLET | ORAL | Status: AC
  Administered 2019-11-30: 25 mg via ORAL
  Filled 2019-11-30: qty 1

## 2019-11-30 MED ORDER — DICLOFENAC SODIUM 1 % EX GEL
2.0000 g | Freq: Four times a day (QID) | CUTANEOUS | Status: DC
  Administered 2019-11-30 – 2019-12-09 (×37): 2 g via TOPICAL
  Filled 2019-11-30: qty 100

## 2019-11-30 MED ORDER — METOPROLOL TARTRATE 50 MG PO TABS
75.0000 mg | ORAL_TABLET | Freq: Two times a day (BID) | ORAL | Status: DC
  Administered 2019-11-30 – 2019-12-04 (×8): 75 mg via ORAL
  Filled 2019-11-30 (×8): qty 1

## 2019-11-30 MED ORDER — CIPROFLOXACIN IN D5W 400 MG/200ML IV SOLN
400.0000 mg | Freq: Two times a day (BID) | INTRAVENOUS | Status: DC
  Administered 2019-11-30 – 2019-12-08 (×15): 400 mg via INTRAVENOUS
  Filled 2019-11-30 (×19): qty 200

## 2019-11-30 MED ORDER — METOPROLOL TARTRATE 50 MG PO TABS: 75.0000 mg | ORAL_TABLET | Freq: Two times a day (BID) | ORAL | Status: DC

## 2019-11-30 NOTE — Progress Notes (Signed)
ID History of chronic venous edema of the legs, hepatitis C RNA positive, presented from home with swelling of the legs for 2 to 3 weeks duration with oozing and pain in the legs and inability to walk.  He also had severe diarrhea on the day of presentation which was on 11/24/2019. He denied fever at home. But in the ED his temperature was initially 99.5 and increased to 104 , blood pressure was 139/95,Blood cultures were sent and he was started on vancomycin and cefepime. He also was noted to have fast A. fib.  He was  in ICU.Now in sinus rhythm and out of ICU  blood culture  positive for Streptococcus group G bacteria.He has been on unasyn to cover both bacteremia and leg lesions- He had culture of one of the punched out lesions and it is growing pseudomonas Pt having intermittent fever   Patient Vitals for the past 24 hrs:  BP Temp Temp src Pulse Resp SpO2 Weight  11/30/19 2045 -- -- -- -- -- 95 % --  11/30/19 1953 (!) 147/68 98.6 F (37 C) -- 86 16 95 % --  11/30/19 1514 129/74 98.5 F (36.9 C) -- 87 18 94 % --  11/30/19 1359 -- -- -- 88 16 96 % --  11/30/19 1125 131/62 100.3 F (37.9 C) Oral 85 18 97 % --  11/30/19 0726 (!) 158/73 99.3 F (37.4 C) Oral 94 19 93 % --  11/30/19 0439 (!) 148/70 (!) 100.4 F (38 C) Oral 87 -- 98 % (!) 191.6 kg  11/29/19 2252 127/67 (!) 101.1 F (38.4 C) Oral 89 20 97 % --   C/o pain left leg  O/e awake No distress until the leg is touched Chest b/l air entry, crepts bases Hss1s2 Abd - obese- soft  Extremities edema legs Eft > rt Thee is some erythema and brawny induration of the left inner thigh and calf area No obvious fluctuation Blistering and deroofed blisters left shin Some circular punched out areas have whitish covering      CBC Latest Ref Rng & Units 11/30/2019 11/29/2019 11/28/2019  WBC 4.0 - 10.5 K/uL 14.7(H) 14.4(H) 12.0(H)  Hemoglobin 13.0 - 17.0 g/dL 8.5(O) 2.7(X) 4.1(O)  Hematocrit 39 - 52 % 30.5(L) 29.8(L) 32.2(L)   Platelets 150 - 400 K/uL 217 228 240    CMP Latest Ref Rng & Units 11/29/2019 11/28/2019 11/27/2019  Glucose 70 - 99 mg/dL 878(M) 767(M) 094(B)  BUN 6 - 20 mg/dL 14 14 15   Creatinine 0.61 - 1.24 mg/dL 0.96 2.83  Sodium 135 - 145 mmol/L 138 138 137  Potassium 3.5 - 5.1 mmol/L 3.6 3.6 3.5  Chloride 98 - 111 mmol/L 100 103 103  CO2 22 - 32 mmol/L 26 25 25   Calcium 8.9 - 10.3 mg/dL 8.0(L) 8.0(L) 8.1(L)  Total Protein 6.5 - 8.1 g/dL - - -  Total Bilirubin 0.3 - 1.2 mg/dL - - -  Alkaline Phos 38 - 126 U/L - - -  AST 15 - 41 U/L - - -  ALT 0 - 44 U/L - - -   11/24/19 Streptococcus Group G in 1 set of blood culture Other set is negative  11/28/19 Bc- Ng 11/27/19- Wound culture pseudomonas  2 d echo Left ventricular ejection fraction, by estimation, is 50 to 55%. The left ventricle has low normal function. Left ventricular endocardial  border not optimally defined to evaluate regional wall motion. The left ventricular internal cavity size was mildly dilated. There is moderate left  ventricular hypertrophy. Left ventricular diastolic function could not be evaluated.  2. Right ventricular systolic function is normal. The right ventricular size is normal. Tricuspid regurgitation signal is inadequate for assessing  PA pressure. 3. Left atrial size was mildly dilated. 4. The mitral valve is normal in structure. No evidence of mitral valve regurgitation. No evidence of mitral stenosis.  5. The aortic valve is normal in structure. Aortic valve regurgitation is  not visualized. No aortic stenosis is present.  6. Very challenging study with limited views and significant tachycardia  during the exam.   Impression/recommendation  streptococcus Group G bacteremia On ampicillin/sulbactam  Source unclear- possible  Legs ( left) but culture neg  need to r/o endocarditis- will need TEE_ seen by cardiologist plan is to take him for TEE once CHF better controlled   cellulitis left leg- with  ongoing fever and leucocytosis may need to image the leg and get surgery on board Will change unasyn to cefazolin and clinda for better coverage   Bilateral venous edema with punched-out lesions on the right leg and circular lesions on the left leg. This is unusual for cellulitis and need to rule out HSV or erosive lichen planus. Pseudomonas in culture- initially thought it was colonization but because of persistent fever will add cipro Observe closely for diarrhea as on clinda as well  A. fib -reverted to sinus On Amiodarone Watch while on cipro  AKI on presentation resolved.  Anemia  Active Hepatitis C infection Viral load of 2.5 million copies Will need Rx as OP  Morbid obesity Discussed the management with patient and pharmacist Dr.Fitzgerald will follow him from tomorrow

## 2019-11-30 NOTE — Progress Notes (Signed)
Steward Drone, NP notified about pt having an elevated temp of 102.9. Pt treated and will recheck temp.

## 2019-11-30 NOTE — Progress Notes (Signed)
Wound care performed on bilateral lower extremities, see wound care orders. Dressing changed.Prevalon boots placed on bilateral lower extremities.

## 2019-11-30 NOTE — Progress Notes (Signed)
ANTICOAGULATION CONSULT NOTE  Pharmacy Consult for heparin Indication: atrial flutter  No Known Allergies  Patient Measurements: Height: 6\' 7"  (200.7 cm) Weight: (!) 197.3 kg (434 lb 15.5 oz) IBW/kg (Calculated) : 93.7 Heparin Dosing Weight: 141 kg (heparin adjusted body weight), initially used 113 kg  Vital Signs: Temp: 100.4 F (38 C) (08/26 0439) Temp Source: Oral (08/26 0439) BP: 148/70 (08/26 0439) Pulse Rate: 87 (08/26 0439)  Labs: Recent Labs    11/28/19 0108 11/28/19 0108 11/28/19 1050 11/28/19 1955 11/29/19 0324 11/29/19 0324 11/29/19 1352 11/29/19 2210 11/30/19 0412  HGB 9.1*   < > 9.9*  --  9.6*  --   --   --  9.5*  HCT 29.9*   < > 32.2*  --  29.8*  --   --   --  30.5*  PLT 188   < > 240  --  228  --   --   --  217  HEPARINUNFRC 0.11*   < > <0.10*   < > 0.17*   < > <0.10* 0.30 0.30  CREATININE 0.76  --   --   --  0.92  --   --   --   --    < > = values in this interval not displayed.    Estimated Creatinine Clearance: 177.4 mL/min (by C-G formula based on SCr of 0.92 mg/dL).   Medical History: Past Medical History:  Diagnosis Date  . Lower extremity edema   . Morbid obesity (HCC) 11/27/2019  . Tobacco abuse 11/27/2019    Assessment: 54 year old patient with strep group G bacteremia. In atrial flutter with RVR on amiodarone and diltiazem. Plan for possible cardioversion. Pharmacy to start on heparin drip.  8/23 1710 HL < 0.1 8/24 1050 HL < 0.1  8/24 1955 HL 0.15  8/25 0330 HL 0.17 subtherapeutic 8/25 1352 HL < 0.10  Goal of Therapy:  Heparin level 0.3-0.7 units/ml Monitor platelets by anticoagulation protocol: Yes   Plan:  08/26 @ 0400 HL 0.30 therapeutic. Will continue current rate at 2800 units/hr and will recheck HL w/ am labs, CBC low stable will continue to monitor.  9/26, PharmD, BCPS Clinical Pharmacist 11/30/2019 6:06 AM

## 2019-11-30 NOTE — Progress Notes (Signed)
Progress Note  Patient Name: Alfred [name] Date of Encounter: 11/30/2019  [facility] Cardiologist: [name], MD   Subjective   Temperature overnight 102.9, given Tylenol Followed by ID, Legs and wraps, bruising Massive leg edema ,  Reports leg swelling has been coming on for several weeks Mild shortness of breath at rest  Echocardiogram reviewed ejection fraction 50 to 55%, moderate LVH Unable to estimate right heart pressures  Maintaining normal sinus rhythm  Ins and outs reviewed, 7.7 L negative, -3 L past 24 hours  Inpatient Medications    Scheduled Meds: . Chlorhexidine Gluconate Cloth  6 each Topical Daily  . furosemide  80 mg Intravenous BID  . metoprolol tartrate  50 mg Oral BID  . potassium chloride  40 mEq Oral Daily  . sodium chloride flush  10-40 mL Intracatheter Q12H   Continuous Infusions: . sodium chloride Stopped (11/28/19 0255)  . sodium chloride 250 mL (11/30/19 0109)  . amiodarone 30 mg/hr (11/30/19 0106)  . ampicillin-sulbactam (UNASYN) IV 3 g (11/30/19 0615)  . heparin 2,800 Units/hr (11/30/19 0516)  . sodium chloride     PRN Meds: sodium chloride, acetaminophen **OR** acetaminophen, guaiFENesin, HYDROcodone-acetaminophen, ipratropium-albuterol, melatonin, morphine injection, ondansetron **OR** ondansetron (ZOFRAN) IV, sodium chloride flush   Vital Signs    Vitals:   11/29/19 2012 11/29/19 2252 11/30/19 0439 11/30/19 0726  BP: (!) 149/74 127/67 (!) 148/70 (!) 158/73  Pulse: 95 89 87 94  Resp:  20  19  Temp: (!) 102.9 F (39.4 C) (!) 101.1 F (38.4 C) (!) 100.4 F (38 C) 99.3 F (37.4 C)  TempSrc: Oral Oral Oral Oral  SpO2: 95% 97% 98% 93%  Weight:   (!) 191.6 kg   Height:        Intake/Output Summary (Last 24 hours) at 11/30/2019 0841 Last data filed at 11/30/2019 0839 Gross per 24 hour  Intake 2075.02 ml  Output 6000 ml  Net -3924.98 ml   Last 3 Weights 11/30/2019 11/27/2019 11/24/2019  Weight (lbs) 422 lb 6.4 oz  434 lb 15.5 oz 250 lb  Weight (kg) 191.599 kg 197.3 kg 113.399 kg      Telemetry    Atrial fib - Personally Reviewed  ECG     - Personally Reviewed  Physical Exam   GEN: No acute distress.   Neck: No JVD Cardiac: RRR, no murmurs, rubs, or gallops.  Respiratory: Clear to auscultation bilaterally. GI: Soft, nontender, non-distended  MS: No edema; No deformity. Neuro:  Nonfocal  Psych: Normal affect   Labs    High Sensitivity Troponin:   Recent Labs  Lab 11/26/19 1811 11/26/19 2000  TROPONINIHS 27* 27*      Chemistry Recent Labs  Lab 11/24/19 1720 11/25/19 1426 11/27/19 0426 11/28/19 0108 11/29/19 0324  NA 136   < > 137 138 138  K 4.1   < > 3.5 3.6 3.6  CL 99   < > 103 103 100  CO2 26   < > 25 25 26   GLUCOSE 140*   < > 129* 141* 132*  BUN 15   < > 15 14 14   CREATININE 1.27*   < > 0.87 0.76 0.92  CALCIUM 8.4*   < > 8.1* 8.0* 8.0*  PROT 8.3*  --   --   --   --   ALBUMIN 3.2*  --   --   --   --   AST 47*  --   --   --   --  ALT 41  --   --   --   --   ALKPHOS 58  --   --   --   --   BILITOT 1.6*  --   --   --   --   GFRNONAA >60   < > >60 >60 >60  GFRAA >60   < > >60 >60 >60  ANIONGAP 11   < > 9 10 12    < > = values in this interval not displayed.     Hematology Recent Labs  Lab 11/28/19 1050 11/29/19 0324 11/30/19 0412  WBC 12.0* 14.4* 14.7*  RBC 4.59 4.38 4.45  HGB 9.9* 9.6* 9.5*  HCT 32.2* 29.8* 30.5*  MCV 70.2* 68.0* 68.5*  MCH 21.6* 21.9* 21.3*  MCHC 30.7 32.2 31.1  RDW 16.7* 16.4* 16.3*  PLT 240 228 217    BNPNo results for input(s): BNP, PROBNP in the last 168 hours.   DDimer No results for input(s): DDIMER in the last 168 hours.   Radiology    12/02/19 EKG SITE RITE  Result Date: 11/29/2019 If [facility] image not attached, placement could not be confirmed due to current cardiac rhythm.   Cardiac Studies   Echo details as above  Patient Profile     54 y.o. male with history of morbid obesity, tobacco abuse, admitted due to  edema found to have atrial flutter on 11/2019.  Spontaneously converted to sinus rhythm on 11/2022.  Assessment & Plan    Atrial flutter, typical Converted to normal sinus rhythm On amiodarone, will transition to 400 twice daily 5 days then down to 200 twice daily oral dosing -Currently on heparin infusion = Will need a NOAC at discharge -Blood pressure and heart rate continue to run high, will increase metoprolol dosing  Acute on chronic diastolic CHF In the setting of arrhythmia, morbid obesity, suspected sleep apnea --- Massive edema in the legs on exam, pitting edema extending into the thighs and sacral area Will require extensive diuresis likely extending into next week Would continue IV Lasix twice daily with repletion of potassium  Streptococcus group B bacteremia On broad-spectrum antibiotics, followed by ID  Long discussion with patient concerning his presentation, plan for care, aggressive diuresis, heart failure management, arrhythmia management  Total encounter time more than 35 minutes  Greater than 50% was spent in counseling and coordination of care with the patient   For questions or updates, please contact [facility] Please consult [email or web address] for contact info under        Signed, 12/2022, MD  11/30/2019, 8:41 AM

## 2019-11-30 NOTE — Progress Notes (Addendum)
PROGRESS NOTE    Alfred [name]   ZOX:[id number]  DOB: 02/14/1966  PCP: Patient, No Pcp Per    DOA: 11/24/2019 LOS: 6   Brief Narrative   Alfred [name] is a 54 y.o. male with history of chronic lower extremity venous stasis for the past 15 years but otherwise no medical history and on no medications who presented to the ED on 11/24/19 with 2-week history of progressively worsening bilateral lower extremity swelling, left greater than right associated with pain and skin breakdown with oozing. Reported subjective fevers.  In the ED, low-grade temperature of 99, tachycardic at 122 bpm, with normal blood pressure.  WBC normal at 9100, lactic acid borderline at 1.9.  Blood work showing creatinine of 1.27 above baseline of 1.16 but with prior baseline around 0.7.  Urinalysis showing proteinuria.  Chest x-ray showed no active disease.  Bilateral lower extremity Doppler negative for DVT bilaterally, but was suboptimal study due to body habitus and edema.    Admitted to hospitalist service.  Blood cultures positive for group G strep.  Being treated with IV antibiotics for cellulitis and bacteremia.       Assessment & Plan   Principal Problem:   Streptococcal sepsis, unspecified (HCC) Active Problems:   Cellulitis of left leg   AKI (acute kidney injury) (HCC)   Bacteremia   Lower extremity edema   Morbid obesity (HCC)   Tobacco abuse   Paroxysmal atrial fibrillation (HCC)   Acute on chronic heart failure with preserved ejection fraction (HFpEF) (HCC)   Atrial flutter (HCC)   Sepsis secondary to group G Strep bacteremia - sepsis POA as evidenced by fever, tachycardia, and known source(s) of infection.  ID is following.  Continue Unasyn (initially was on Vanc Rocephin).  Monitor CBC.  Will need TEE to rule out endocarditis (cardiology wants further diuresis beforehand).  Follow up repeat blood cultures.       Cellulitis of left leg - POA, due to severe venous stasis.   Antibiotic as above.  Wound care.  PRN analgesics.  AKI (acute kidney injury) - POA, improving.  Monitor BMP.  Lower extremity edema - chronic venous stasis.  Wound care.  Monitor.  PT evaluation.  Atrial flutter - Currently sinus rhythm.  CHA2DS2-VASc of 1.  Cardiology following.  Continue heparin gtt, transition to DOAC on discharge.  Continue PO Amiodarone.  Continue Lopressor 50 mg PO BID.    Acute on chronic HFpEF - presented fluid overloaded, massive edema of BLE's up to proximal thighs and abdomen.  Improving with IV diuresis.  Cardiology following.  Continue Lasix 80 mg IV BID.  Monitor I/O's, daily weights.  Low sodium diet.  Will require several days aggressive IV diuresis.  Hepatitis C - will need GI follow up outpatient to discuss treatment.  Viral load > 2.285million.   Morbid obesity - Body mass index is 47.59 kg/m. Complicates overall care and prognosis.  Tobacco abuse - counseled on cessation.    DVT prophylaxis: on heparin gtt    Diet:  Diet Orders (From admission, onward)    Start     Ordered   11/28/19 0756  Diet heart healthy/carb modified Room service appropriate? Yes; Fluid consistency: Thin  Diet effective now       Question Answer Comment  Diet-HS Snack? Nothing   Room service appropriate? Yes   Fluid consistency: Thin      11/28/19 0755            Code Status: Full Code  Subjective 11/30/19    Patient seen this AM at bedside.  Says he feels well.  Felt a little hot overnight, no chills.  He did have a fever around 430 AM this AM.  He denies any acute complaints including chest pain, palpitations, fever/chills, abdominal pain, N/V, SOB.  No acute events reported.   Disposition Plan & Communication   Status is: Inpatient  Remains inpatient appropriate because:IV treatments appropriate due to intensity of illness or inability to take PO.   Ongoing IV diuresis and IV antibiotics for bacteremia.  Dispo: The patient is from: Home               Anticipated d/c is to: Home              Anticipated d/c date is: 2 days              Patient currently is not medically stable to d/c.    Family Communication: none at bedside, will attempt to call   Consults, Procedures, Significant Events   Consultants:   Cardiology  Infectious disease  Wound care RN  Procedures:   None  Antimicrobials:   Unasyn    Objective   Vitals:   11/29/19 2012 11/29/19 2252 11/30/19 0439 11/30/19 0726  BP: (!) 149/74 127/67 (!) 148/70 (!) 158/73  Pulse: 95 89 87 94  Resp:  20  19  Temp: (!) 102.9 F (39.4 C) (!) 101.1 F (38.4 C) (!) 100.4 F (38 C) 99.3 F (37.4 C)  TempSrc: Oral Oral Oral Oral  SpO2: 95% 97% 98% 93%  Weight:   (!) 191.6 kg   Height:        Intake/Output Summary (Last 24 hours) at 11/30/2019 0747 Last data filed at 11/30/2019 7829 Gross per 24 hour  Intake 2075.02 ml  Output 5150 ml  Net -3074.98 ml   Filed Weights   11/24/19 1249 11/27/19 0600 11/30/19 0439  Weight: 113.4 kg (!) 197.3 kg (!) 191.6 kg    Physical Exam:  General exam: awake, alert, no acute distress, obese Respiratory system: overall CTAB but diminished due to body habitus, no wheezes or rhonchi, normal respiratory effort. Cardiovascular system: normal S1/S2, RRR, bilateral LE edema proximal to leg wraps to at least proximal thighs Central nervous system: A&O x3. no gross focal neurologic deficits, normal speech Extremities: moves all, normal tone, b/l LE's wrapped and edema as above Psychiatry: normal mood, congruent affect  Labs   Data Reviewed: I have personally reviewed following labs and imaging studies  CBC: Recent Labs  Lab 11/24/19 1720 11/25/19 1426 11/26/19 0840 11/26/19 0840 11/27/19 0426 11/28/19 0108 11/28/19 1050 11/29/19 0324 11/30/19 0412  WBC 9.1   < > 8.6   < > 8.9 10.2 12.0* 14.4* 14.7*  NEUTROABS 8.0*  --  7.3  --  7.4  --   --   --  11.4*  HGB 12.9*   < > 10.2*   < > 9.8* 9.1* 9.9* 9.6* 9.5*  HCT 42.0    < > 33.1*   < > 32.6* 29.9* 32.2* 29.8* 30.5*  MCV 69.4*   < > 68.8*   < > 70.9* 70.0* 70.2* 68.0* 68.5*  PLT 185   < > 142*   < > 157 188 240 228 217   < > = values in this interval not displayed.   Basic Metabolic Panel: Recent Labs  Lab 11/25/19 1938 11/26/19 0505 11/26/19 1512 11/27/19 0426 11/28/19 0108 11/29/19 0324  NA 134* 134*  --  137 138 138  K 3.0* 3.4*  --  3.5 3.6 3.6  CL 97* 102  --  103 103 100  CO2 23 18*  --  25 25 26   GLUCOSE 124* 109*  --  129* 141* 132*  BUN 15 15  --  15 14 14   CREATININE 1.23 1.10  --  0.87 0.76 0.92  CALCIUM 7.9* 8.2*  --  8.1* 8.0* 8.0*  MG 2.1  --  2.5*  --   --   --   PHOS  --   --  2.3*  --   --   --    GFR: Estimated Creatinine Clearance: 174.6 mL/min (by C-G formula based on SCr of 0.92 mg/dL). Liver Function Tests: Recent Labs  Lab 11/24/19 1720  AST 47*  ALT 41  ALKPHOS 58  BILITOT 1.6*  PROT 8.3*  ALBUMIN 3.2*   No results for input(s): LIPASE, AMYLASE in the last 168 hours. No results for input(s): AMMONIA in the last 168 hours. Coagulation Profile: Recent Labs  Lab 11/24/19 1720 11/25/19 1426  INR 1.0 1.2   Cardiac Enzymes: No results for input(s): CKTOTAL, CKMB, CKMBINDEX, TROPONINI in the last 168 hours. BNP (last 3 results) No results for input(s): PROBNP in the last 8760 hours. HbA1C: No results for input(s): HGBA1C in the last 72 hours. CBG: Recent Labs  Lab 11/25/19 1913 11/26/19 1655  GLUCAP 122* 103*   Lipid Profile: Recent Labs    11/28/19 0108  CHOL 108  HDL 18*  LDLCALC 58  TRIG 11/28/19*  CHOLHDL 6.0   Thyroid Function Tests: No results for input(s): TSH, T4TOTAL, FREET4, T3FREE, THYROIDAB in the last 72 hours. Anemia Panel: No results for input(s): VITAMINB12, FOLATE, FERRITIN, TIBC, IRON, RETICCTPCT in the last 72 hours. Sepsis Labs: Recent Labs  Lab 11/24/19 1720 11/25/19 1426 11/25/19 1938 11/26/19 0505  PROCALCITON  --  4.86  --   --   LATICACIDVEN 1.9 2.5* 1.7 1.7     Recent Results (from the past 240 hour(s))  Culture, blood (Routine x 2)     Status: None   Collection Time: 11/24/19  3:14 PM   Specimen: BLOOD  Result Value Ref Range Status   Specimen Description BLOOD BLOOD RIGHT HAND  Final   Special Requests   Final    BOTTLES DRAWN AEROBIC AND ANAEROBIC Blood Culture results may not be optimal due to an inadequate volume of blood received in culture bottles   Culture   Final    NO GROWTH 5 DAYS Performed at [facility], [address] [place], [address] [place]    Report Status 11/29/2019 FINAL  Final  Culture, blood (Routine x 2)     Status: Abnormal   Collection Time: 11/24/19  5:20 PM   Specimen: BLOOD  Result Value Ref Range Status   Specimen Description   Final    BLOOD BLOOD RIGHT ARM Performed at [facility], [address] [place], [address] [place]    Special Requests   Final    BOTTLES DRAWN AEROBIC AND ANAEROBIC Blood Culture adequate volume Performed at [facility], [address] [place], [address] [place]    Culture  Setup Time   Final    Organism ID to follow GRAM POSITIVE COCCI IN BOTH AEROBIC AND ANAEROBIC BOTTLES CRITICAL RESULT CALLED TO, READ BACK BY AND VERIFIED WITH: SCOTT HALL 11/25/19 AT 0530 HS Performed at [facility], [address], [place], [address] [place]  Culture STREPTOCOCCUS GROUP G (A)  Final   Report Status 11/27/2019 FINAL  Final   Organism ID, Bacteria STREPTOCOCCUS GROUP G  Final      Susceptibility   Streptococcus group g - MIC*    CLINDAMYCIN RESISTANT Resistant     AMPICILLIN <=0.25 SENSITIVE Sensitive     ERYTHROMYCIN >=8 RESISTANT Resistant     VANCOMYCIN 0.5 SENSITIVE Sensitive     CEFTRIAXONE <=0.12 SENSITIVE Sensitive     LEVOFLOXACIN 0.5 SENSITIVE Sensitive     * STREPTOCOCCUS GROUP G  Blood Culture ID Panel (Reflexed)     Status: Abnormal   Collection Time: 11/24/19  5:20 PM  Result Value Ref Range Status   Enterococcus  faecalis NOT DETECTED NOT DETECTED Final   Enterococcus Faecium NOT DETECTED NOT DETECTED Final   Listeria monocytogenes NOT DETECTED NOT DETECTED Final   Staphylococcus species NOT DETECTED NOT DETECTED Final   Staphylococcus aureus (BCID) NOT DETECTED NOT DETECTED Final   Staphylococcus epidermidis NOT DETECTED NOT DETECTED Final   Staphylococcus lugdunensis NOT DETECTED NOT DETECTED Final   Streptococcus species DETECTED (A) NOT DETECTED Final    Comment: Not Enterococcus species, Streptococcus agalactiae, Streptococcus pyogenes, or Streptococcus pneumoniae. CRITICAL RESULT CALLED TO, READ BACK BY AND VERIFIED WITH: SCOTT HALL 11/25/19 AT 0530 HS    Streptococcus agalactiae NOT DETECTED NOT DETECTED Final   Streptococcus pneumoniae NOT DETECTED NOT DETECTED Final   Streptococcus pyogenes NOT DETECTED NOT DETECTED Final   A.calcoaceticus-baumannii NOT DETECTED NOT DETECTED Final   Bacteroides fragilis NOT DETECTED NOT DETECTED Final   Enterobacterales NOT DETECTED NOT DETECTED Final   Enterobacter cloacae complex NOT DETECTED NOT DETECTED Final   Escherichia coli NOT DETECTED NOT DETECTED Final   Klebsiella aerogenes NOT DETECTED NOT DETECTED Final   Klebsiella oxytoca NOT DETECTED NOT DETECTED Final   Klebsiella pneumoniae NOT DETECTED NOT DETECTED Final   Proteus species NOT DETECTED NOT DETECTED Final   Salmonella species NOT DETECTED NOT DETECTED Final   Serratia marcescens NOT DETECTED NOT DETECTED Final   Haemophilus influenzae NOT DETECTED NOT DETECTED Final   Neisseria meningitidis NOT DETECTED NOT DETECTED Final   Pseudomonas aeruginosa NOT DETECTED NOT DETECTED Final   Stenotrophomonas maltophilia NOT DETECTED NOT DETECTED Final   Candida albicans NOT DETECTED NOT DETECTED Final   Candida auris NOT DETECTED NOT DETECTED Final   Candida glabrata NOT DETECTED NOT DETECTED Final   Candida krusei NOT DETECTED NOT DETECTED Final   Candida parapsilosis NOT DETECTED NOT DETECTED  Final   Candida tropicalis NOT DETECTED NOT DETECTED Final   Cryptococcus neoformans/gattii NOT DETECTED NOT DETECTED Final    Comment: Performed at [facility], [address] [place], [place] [address]  SARS Coronavirus 2 by RT PCR (hospital order, performed in [facility] hospital lab) Nasopharyngeal Nasopharyngeal Swab     Status: None   Collection Time: 11/24/19  7:40 PM   Specimen: Nasopharyngeal Swab  Result Value Ref Range Status   SARS Coronavirus 2 NEGATIVE NEGATIVE Final    Comment: (NOTE) SARS-CoV-2 target nucleic acids are NOT DETECTED.  The SARS-CoV-2 RNA is generally detectable in upper and lower respiratory specimens during the acute phase of infection. The lowest concentration of SARS-CoV-2 viral copies this assay can detect is 250 copies / mL. A negative result does not preclude SARS-CoV-2 infection and should not be used as the sole basis for treatment or other patient management decisions.  A negative result may occur with improper specimen collection / handling, submission of  specimen other than nasopharyngeal swab, presence of viral mutation(s) within the areas targeted by this assay, and inadequate number of viral copies (<250 copies / mL). A negative result must be combined with clinical observations, patient history, and epidemiological information.  Fact Sheet for Patients:   BoilerBrush.com.cy  Fact Sheet for Healthcare Providers: https://pope.com/  This test is not yet approved or  cleared by the [place] FDA and has been authorized for detection and/or diagnosis of SARS-CoV-2 by FDA under an Emergency Use Authorization (EUA).  This EUA will remain in effect (meaning this test can be used) for the duration of the COVID-19 declaration under Section 564(b)(1) of the Act, 21 U.S.C. section 360bbb-3(b)(1), unless the authorization is terminated or revoked sooner.  Performed at [facility], [address] [place], [place] [address]   C Difficile Quick Screen w PCR reflex     Status: None   Collection Time: 11/25/19 10:04 AM   Specimen: STOOL  Result Value Ref Range Status   C Diff antigen NEGATIVE NEGATIVE Final   C Diff toxin NEGATIVE NEGATIVE Final   C Diff interpretation No C. difficile detected.  Final    Comment: Performed at [facility], [address] [place], [place] [address]  MRSA PCR Screening     Status: None   Collection Time: 11/25/19  4:20 PM   Specimen: Nasopharyngeal  Result Value Ref Range Status   MRSA by PCR NEGATIVE NEGATIVE Final    Comment:        The GeneXpert MRSA Assay (FDA approved for NASAL specimens only), is one component of a comprehensive MRSA colonization surveillance program. It is not intended to diagnose MRSA infection nor to guide or monitor treatment for MRSA infections. Performed at [facility], [address] [place], [place] [address]   Aerobic Culture (superficial specimen)     Status: None (Preliminary result)   Collection Time: 11/27/19  4:50 PM   Specimen: Wound  Result Value Ref Range Status   Specimen Description   Final    WOUND Performed at [facility], [address], [place], [place] [address]    Special Requests   Final    LEFT LEG/SHIN Performed at [facility], [address] [place], [place] [address]    Gram Stain   Final    RARE WBC PRESENT,BOTH PMN AND MONONUCLEAR MODERATE GRAM POSITIVE COCCI FEW GRAM NEGATIVE RODS    Culture   Final    MODERATE PSEUDOMONAS AERUGINOSA REPEATIN SENSI CULTURE REINCUBATED FOR BETTER GROWTH Performed at [facility], [address], [place], [place] [address]    Report Status PENDING  Incomplete  Virus culture     Status: None   Collection Time: 11/27/19  4:50 PM   Specimen: Leg, Left; Wound  Result Value Ref Range Status   Viral Culture Comment  Final    Comment: (NOTE) Preliminary  Report: No virus isolated at 24 hours. Next report to follow after 4 days. Performed At: [facility] [address] [place], [place] [id number] Jolene Schimke MD LY:[id number]    Source of Sample WD LEFT [facility]  Final    Comment: Performed at [facility], [address] [place], [place] [address]  Culture, blood (single) w Reflex to ID Panel     Status: None (Preliminary result)   Collection Time: 11/28/19  1:20 AM   Specimen: BLOOD  Result Value Ref Range Status   Specimen Description BLOOD RIGHT ANTECUBITAL  Final  Special Requests   Final    BOTTLES DRAWN AEROBIC AND ANAEROBIC Blood Culture results may not be optimal due to an inadequate volume of blood received in culture bottles   Culture   Final    NO GROWTH 2 DAYS Performed at [facility], [address], [place], [place] [address]    Report Status PENDING  Incomplete      Imaging Studies   [place] EKG SITE RITE  Result Date: 11/29/2019 If Site Rite image not attached, placement could not be confirmed due to current cardiac rhythm.    Medications   Scheduled Meds: . Chlorhexidine Gluconate Cloth  6 each Topical Daily  . furosemide  80 mg Intravenous BID  . metoprolol tartrate  50 mg Oral BID  . potassium chloride  40 mEq Oral Daily  . sodium chloride flush  10-40 mL Intracatheter Q12H   Continuous Infusions: . sodium chloride Stopped (11/28/19 0255)  . sodium chloride 250 mL (11/30/19 0109)  . amiodarone 30 mg/hr (11/30/19 0106)  . ampicillin-sulbactam (UNASYN) IV 3 g (11/30/19 0615)  . heparin 2,800 Units/hr (11/30/19 0516)  . sodium chloride         LOS: 6 days    Time spent: 25 minutes with > 50% spent in coordination of care and direct patient contact.    Pennie Banter, DO [facility]  11/30/2019, 7:47 AM    If 7PM-7AM, please contact night-coverage. How to contact the [facility] Attending or Consulting provider 7A - 7P or covering provider during after hours 7P  -7A, for this patient?    1. Check the care team in [facility] and look for a) attending/consulting TR[facility] provider listed and b) the [facility] team listed 2. Log into [email or web address] and use [place]'s universal password to access. If you do not have the password, please contact the hospital operator. 3. Locate the [facility] provider you are looking for under [facility] and page to a number that you can be directly reached. 4. If you still have difficulty reaching the provider, please page the [facility] (Director on Call) for the Hospitalists listed on amion for assistance.

## 2019-11-30 NOTE — Progress Notes (Signed)
Physical Therapy Treatment Patient Details Name: Alfred [name] MRN: [id number] DOB: 01-08-1966 Today's Date: 11/30/2019    History of Present Illness Alfred [name] is a 54yoM who comes to [facility] on 8/20 [name] pain and LEE. PMH: chronic BLE venous statis, hepatits. Pt admitted with BLE cellulitis. Since arrival pt having issues with fever and tachy into 140s-160s. At time of evaluation, pt continues to have significant LEE bilat.    PT Comments    .Pt in bed upon entry, awake agreeable to participate. Commenced with BLE and UE strengthening, LUE avoided to prevent disruption of pic line. Legs remain wrapped, pain similar, Left knee severely painful with all attempts to mobilize with ROM restrictions, warrants workup from medical team as pt has no prior left knee complaints. Pt denies any recent insult or trauma. Pt requires mod-maxA for AA/ROM of LLE, but mod-no assist on RLE. Hip extension strength remains very much limited, would not allow for safe attempt to transfer at this time. Will continue to follow.    Follow Up Recommendations  SNF;Supervision for mobility/OOB     Equipment Recommendations  Other (comment) (to be determined)    Recommendations for Other Services OT consult     Precautions / Restrictions Precautions Precautions: Fall Precaution Comments: bilat leg wounds, Left knee pain Restrictions Weight Bearing Restrictions: No    Mobility  Bed Mobility                  Transfers                    Ambulation/Gait                 Stairs             Wheelchair Mobility    Modified Rankin (Stroke Patients Only)       Balance                                            Cognition Arousal/Alertness: Awake/alert Behavior During Therapy: WFL for tasks assessed/performed Overall Cognitive Status: Within Functional Limits for tasks assessed                                        Exercises  General Exercises - Lower Extremity Short Arc Quad: AROM;AAROM;Both;15 reps;Supine;Limitations Short Arc Quad Limitations: no assist needed on Rt Heel Slides: Both;AAROM;15 reps;Supine Heel Slides Limitations: minA required bilat; LLE limited in range by >50% Hip ABduction/ADduction: AAROM;Both;15 reps;Supine;Limitations Hip Abduction/Adduction Limitations: min-modA req Straight Leg Raises: AAROM;Both;Supine;15 reps;Limitations Straight Leg Raises Limitations: min-modA req Hip Flexion/Marching: AAROM;Both;Right;15 reps;Supine Mini-Sqauts: Strengthening;Right;10 reps;Supine;Limitations Mini Squats Limitations: manually resisted Other Exercises Other Exercises: Manually reisted chest press/row RUE x10 each    General Comments        Pertinent Vitals/Pain Pain Assessment: 0-10 Pain Score: 6  Pain Location: bilat legs Pain Descriptors / Indicators: Aching;Discomfort;Grimacing Pain Intervention(s): Limited activity within patient's tolerance;Monitored during session;Repositioned    Home Living                      Prior Function            PT Goals (current goals can now be found in the care plan section) Acute Rehab PT Goals Patient Stated Goal: to move better  PT Goal Formulation: With patient Time For Goal Achievement: 12/12/19 Potential to Achieve Goals: Fair Progress towards PT goals: Progressing toward goals    Frequency    Min 2X/week      PT Plan Current plan remains appropriate    Co-evaluation              AM-PAC PT "6 Clicks" Mobility   Outcome Measure  Help needed turning from your back to your side while in a flat bed without using bedrails?: Total Help needed moving from lying on your back to sitting on the side of a flat bed without using bedrails?: Total Help needed moving to and from a bed to a chair (including a wheelchair)?: Total Help needed standing up from a chair using your arms (e.g., wheelchair or bedside chair)?: Total Help  needed to walk in hospital room?: Total Help needed climbing 3-5 steps with a railing? : Total 6 Click Score: 6    End of Session   Activity Tolerance: Patient limited by fatigue;Patient tolerated treatment well;Patient limited by pain Patient left: in bed;with nursing/sitter in room;with call bell/phone within reach Nurse Communication: Mobility status PT Visit Diagnosis: Difficulty in walking, not elsewhere classified (R26.2);Unsteadiness on feet (R26.81);Muscle weakness (generalized) (M62.81)     Time: 7654-6503 PT Time Calculation (min) (ACUTE ONLY): 25 min  Charges:  $Therapeutic Exercise: 23-37 mins                     4:34 PM, 11/30/19 Rosamaria Lints, PT, DPT Physical Therapist - [facility]  [phone] (ASCOM)    Alfred [name] 11/30/2019, 4:30 PM

## 2019-11-30 NOTE — Progress Notes (Signed)
ANTICOAGULATION CONSULT NOTE  Pharmacy Consult for heparin Indication: atrial flutter  No Known Allergies  Patient Measurements: Height: 6\' 7"  (200.7 cm) Weight: (!) 197.3 kg (434 lb 15.5 oz) IBW/kg (Calculated) : 93.7 Heparin Dosing Weight: 141 kg (heparin adjusted body weight), initially used 113 kg  Vital Signs: Temp: 101.1 F (38.4 C) (08/25 2252) Temp Source: Oral (08/25 2252) BP: 127/67 (08/25 2252) Pulse Rate: 89 (08/25 2252)  Labs: Recent Labs    11/27/19 0426 11/27/19 1710 11/28/19 0108 11/28/19 0108 11/28/19 1050 11/28/19 1955 11/29/19 0324 11/29/19 1352 11/29/19 2210  HGB 9.8*  --  9.1*   < > 9.9*  --  9.6*  --   --   HCT 32.6*  --  29.9*  --  32.2*  --  29.8*  --   --   PLT 157  --  188  --  240  --  228  --   --   HEPARINUNFRC  --    < > 0.11*   < > <0.10*   < > 0.17* <0.10* 0.30  CREATININE 0.87  --  0.76  --   --   --  0.92  --   --    < > = values in this interval not displayed.    Estimated Creatinine Clearance: 177.4 mL/min (by C-G formula based on SCr of 0.92 mg/dL).   Medical History: Past Medical History:  Diagnosis Date   Lower extremity edema    Morbid obesity (HCC) 11/27/2019   Tobacco abuse 11/27/2019    Assessment: 54 year old patient with strep group G bacteremia. In atrial flutter with RVR on amiodarone and diltiazem. Plan for possible cardioversion. Pharmacy to start on heparin drip.  8/23 1710 HL < 0.1 8/24 1050 HL < 0.1  8/24 1955 HL 0.15  8/25 0330 HL 0.17 subtherapeutic 8/25 1352 HL < 0.10  Goal of Therapy:  Heparin level 0.3-0.7 units/ml Monitor platelets by anticoagulation protocol: Yes   Plan:  08/25 @ 2200 HL 0.30 therapeutic. Will continue current rate at 2800 units/hr and will recheck HL at 0400 and continue to monitor.  9/25, PharmD, BCPS Clinical Pharmacist 11/30/2019 12:28 AM

## 2019-11-30 NOTE — Progress Notes (Signed)
Pt vitals rechecked at this time temp is down to 101.1. Steward Drone, NP notified and NEW orders received to give pt Tylenol 1000 mg PO instead of Tylenol 650 mg PO after a rechecking of temp in 6 hours.

## 2019-12-01 DIAGNOSIS — A419 Sepsis, unspecified organism: Secondary | ICD-10-CM

## 2019-12-01 DIAGNOSIS — L03116 Cellulitis of left lower limb: Secondary | ICD-10-CM

## 2019-12-01 DIAGNOSIS — R6520 Severe sepsis without septic shock: Secondary | ICD-10-CM

## 2019-12-01 DIAGNOSIS — A409 Streptococcal sepsis, unspecified: Principal | ICD-10-CM

## 2019-12-01 LAB — BASIC METABOLIC PANEL WITH GFR
Anion gap: 11 (ref 5–15)
BUN: 14 mg/dL (ref 6–20)
CO2: 29 mmol/L (ref 22–32)
Calcium: 7.8 mg/dL — ABNORMAL LOW (ref 8.9–10.3)
Chloride: 96 mmol/L — ABNORMAL LOW (ref 98–111)
Creatinine, Ser: 0.96 mg/dL (ref 0.61–1.24)
GFR calc Af Amer: >60 mL/min
GFR calc non Af Amer: >60 mL/min
Glucose, Bld: 117 mg/dL — ABNORMAL HIGH (ref 70–99)
Potassium: 4 mmol/L (ref 3.5–5.1)
Sodium: 136 mmol/L (ref 135–145)

## 2019-12-01 LAB — CBC
HCT: 27.8 % — ABNORMAL LOW (ref 39.0–52.0)
Hemoglobin: 8.9 g/dL — ABNORMAL LOW (ref 13.0–17.0)
MCH: 21.8 pg — ABNORMAL LOW (ref 26.0–34.0)
MCHC: 32 g/dL (ref 30.0–36.0)
MCV: 68 fL — ABNORMAL LOW (ref 80.0–100.0)
Platelets: 223 K/uL (ref 150–400)
RBC: 4.09 MIL/uL — ABNORMAL LOW (ref 4.22–5.81)
RDW: 16.3 % — ABNORMAL HIGH (ref 11.5–15.5)
WBC: 14.1 K/uL — ABNORMAL HIGH (ref 4.0–10.5)
nRBC: 0.3 % — ABNORMAL HIGH (ref 0.0–0.2)

## 2019-12-01 LAB — HEPARIN LEVEL (UNFRACTIONATED): Heparin Unfractionated: 0.36 IU/mL (ref 0.30–0.70)

## 2019-12-01 LAB — MAGNESIUM: Magnesium: 1.9 mg/dL (ref 1.7–2.4)

## 2019-12-01 MED ORDER — AMIODARONE HCL 200 MG PO TABS
400.0000 mg | ORAL_TABLET | Freq: Two times a day (BID) | ORAL | Status: AC
  Administered 2019-12-01 – 2019-12-05 (×8): 400 mg via ORAL
  Filled 2019-12-01 (×9): qty 2

## 2019-12-01 NOTE — Progress Notes (Signed)
   Heart Failure Nurse Navigator Note  Spoke with patient about his living situation, he states that he was homeless until December of last year when his cousins took him in.  He still resides with them.  They do the grocery shopping and etc.  He does not have a job or any source of income.  When questioned he said that obtaining medications would be difficult.  Discussed involving the care coordinator to assist with his situation.  He is not married and has one son that is 37 years old.  Instructed that I would follow up with him on Monday

## 2019-12-01 NOTE — Progress Notes (Addendum)
PROGRESS NOTE    Alfred [name]   HMC:[id number]  DOB: 09-Jul-1965  PCP: Patient, No Pcp Per    DOA: 11/24/2019 LOS: 7   Brief Narrative   Alfred [name] is a 54 y.o. male with history of chronic lower extremity venous stasis for the past 15 years but otherwise no medical history and on no medications who presented to the ED on 11/24/19 with 2-week history of progressively worsening bilateral lower extremity swelling, left greater than right associated with pain and skin breakdown with oozing. Reported subjective fevers.  In the ED, low-grade temperature of 99, tachycardic at 122 bpm, with normal blood pressure.  WBC normal at 9100, lactic acid borderline at 1.9.  Blood work showing creatinine of 1.27 above baseline of 1.16 but with prior baseline around 0.7.  Urinalysis showing proteinuria.  Chest x-ray showed no active disease.  Bilateral lower extremity Doppler negative for DVT bilaterally, but was suboptimal study due to body habitus and edema.    Admitted to hospitalist service.  Blood cultures positive for group G strep.  Being treated with IV antibiotics for cellulitis and bacteremia.       Assessment & Plan   Principal Problem:   Streptococcal sepsis, unspecified (HCC) Active Problems:   Cellulitis of left leg   AKI (acute kidney injury) (HCC)   Bacteremia   Lower extremity edema   Morbid obesity (HCC)   Tobacco abuse   Paroxysmal atrial fibrillation (HCC)   Acute on chronic heart failure with preserved ejection fraction (HFpEF) (HCC)   Atrial flutter (HCC)   Acute on chronic HFpEF - presented fluid overloaded, massive edema of BLE's up to proximal thighs and abdomen.  Improving with IV diuresis.  Cardiology following.  Continue Lasix 80 mg IV BID.  Monitor I/O's, daily weights.  Low sodium diet.  Will require several days aggressive IV diuresis.  Sepsis secondary to group G Strep bacteremia - sepsis POA as evidenced by fever, tachycardia, and known source(s)  of infection.  ID is following.  Continue Unasyn (initially was on Vanc Rocephin).  Monitor CBC.  Will need TEE to rule out endocarditis (cardiology wants further diuresis beforehand).  Follow up repeat blood cultures.       Cellulitis of left leg - POA, due to severe venous stasis.  Antibiotic as above.  Wound care.  PRN analgesics.  AKI (acute kidney injury) - POA, improving.  Monitor BMP.  Lower extremity edema - chronic venous stasis.  Wound care.  Monitor.  PT recommends SNF. Venous dopplers of lower extremities on admission were unable to exclude DVT, suboptimal study due to body habitus.  Atrial flutter - Currently sinus rhythm.  CHA2DS2-VASc of 1.  Cardiology following.  Continue heparin gtt, transition to DOAC on discharge.  Continue PO Amiodarone.  Continue Lopressor 50 mg PO BID.    Microcytic anemia - mild on admission, progressive.  Hbg trend from admission forward: 12.9>>10.9>>10.2>>...8.9 today.   Anemia panel with AM labs.  Check FOBT.  Monitor CBC.  Hepatitis C - will need GI follow up outpatient to discuss treatment.  Viral load > 2.64million.   Morbid obesity - Body mass index is 47.31 kg/m. Complicates overall care and prognosis.  Tobacco abuse - counseled on cessation.    DVT prophylaxis: on heparin gtt    Diet:  Diet Orders (From admission, onward)    Start     Ordered   11/28/19 0756  Diet heart healthy/carb modified Room service appropriate? Yes; Fluid consistency: Thin  Diet  effective now       Question Answer Comment  Diet-HS Snack? Nothing   Room service appropriate? Yes   Fluid consistency: Thin      11/28/19 0755            Code Status: Full Code    Subjective 12/01/19    Patient seen this AM at bedside.  Reports ongoing pain/discomfort in his legs due to swelling, minimally improved so far.  Denies any pain in his left knee today.  PT yesterday expressed concern for L knee as patient did not tolerate even bed exercises due to left knee pain  yesterday.  Xray showed only arthritis.     Disposition Plan & Communication   Status is: Inpatient  Remains inpatient appropriate because:IV treatments appropriate due to intensity of illness or inability to take PO.   Ongoing IV diuresis and IV antibiotics for bacteremia.  Dispo: The patient is from: Home              Anticipated d/c is to: Home              Anticipated d/c date is: > 3 days              Patient currently is not medically stable to d/c.    Family Communication: none at bedside, will attempt to call   Consults, Procedures, Significant Events   Consultants:   Cardiology  Infectious disease  Wound care RN  Procedures:   None  Antimicrobials:   Unasyn    Objective   Vitals:   11/30/19 2045 12/01/19 0357 12/01/19 0822 12/01/19 1157  BP:  133/67 (!) 141/66 109/64  Pulse:  84 81 69  Resp:  17 18 18   Temp:  100.2 F (37.9 C) 98.4 F (36.9 C) 97.7 F (36.5 C)  TempSrc:  Oral Oral Oral  SpO2: 95% 93% 96% 97%  Weight:  (!) 190.5 kg    Height:        Intake/Output Summary (Last 24 hours) at 12/01/2019 1511 Last data filed at 12/01/2019 1451 Gross per 24 hour  Intake 2247.25 ml  Output 3525 ml  Net -1277.75 ml   Filed Weights   11/27/19 0600 11/30/19 0439 12/01/19 0357  Weight: (!) 197.3 kg (!) 191.6 kg (!) 190.5 kg    Physical Exam:  General exam: awake, alert, no acute distress, obese Respiratory system: CTAB but diminished due to body habitus, normal respiratory effort. Cardiovascular system: normal S1/S2, RRR, bilateral LE pitting/indurated edema proximal to leg wraps up to at least proximal thighs and lower trunk Central nervous system: A&O x3. no gross focal neurologic deficits, normal speech Extremities: b/l LE's wrapped and offloading boots in place.  Pitting dema as above Psychiatry: normal mood, congruent affect  Labs   Data Reviewed: I have personally reviewed following labs and imaging studies  CBC: Recent Labs  Lab  11/24/19 1720 11/25/19 1426 11/26/19 0840 11/26/19 0840 11/27/19 0426 11/27/19 0426 11/28/19 0108 11/28/19 1050 11/29/19 0324 11/30/19 0412 12/01/19 0059  WBC 9.1   < > 8.6   < > 8.9   < > 10.2 12.0* 14.4* 14.7* 14.1*  NEUTROABS 8.0*  --  7.3  --  7.4  --   --   --   --  11.4*  --   HGB 12.9*   < > 10.2*   < > 9.8*   < > 9.1* 9.9* 9.6* 9.5* 8.9*  HCT 42.0   < > 33.1*   < > 32.6*   < >  29.9* 32.2* 29.8* 30.5* 27.8*  MCV 69.4*   < > 68.8*   < > 70.9*   < > 70.0* 70.2* 68.0* 68.5* 68.0*  PLT 185   < > 142*   < > 157   < > 188 240 228 217 223   < > = values in this interval not displayed.   Basic Metabolic Panel: Recent Labs  Lab 11/25/19 1938 11/25/19 1938 11/26/19 0505 11/26/19 1512 11/27/19 0426 11/28/19 0108 11/29/19 0324 12/01/19 0059  NA 134*   < > 134*  --  137 138 138 136  K 3.0*   < > 3.4*  --  3.5 3.6 3.6 4.0  CL 97*   < > 102  --  103 103 100 96*  CO2 23   < > 18*  --  25 25 26 29   GLUCOSE 124*   < > 109*  --  129* 141* 132* 117*  BUN 15   < > 15  --  15 14 14 14   CREATININE 1.23   < > 1.10  --  0.87 0.76 0.92 0.96  CALCIUM 7.9*   < > 8.2*  --  8.1* 8.0* 8.0* 7.8*  MG 2.1  --   --  2.5*  --   --   --  1.9  PHOS  --   --   --  2.3*  --   --   --   --    < > = values in this interval not displayed.   GFR: Estimated Creatinine Clearance: 166.6 mL/min (by C-G formula based on SCr of 0.96 mg/dL). Liver Function Tests: Recent Labs  Lab 11/24/19 1720  AST 47*  ALT 41  ALKPHOS 58  BILITOT 1.6*  PROT 8.3*  ALBUMIN 3.2*   No results for input(s): LIPASE, AMYLASE in the last 168 hours. No results for input(s): AMMONIA in the last 168 hours. Coagulation Profile: Recent Labs  Lab 11/24/19 1720 11/25/19 1426  INR 1.0 1.2   Cardiac Enzymes: No results for input(s): CKTOTAL, CKMB, CKMBINDEX, TROPONINI in the last 168 hours. BNP (last 3 results) No results for input(s): PROBNP in the last 8760 hours. HbA1C: No results for input(s): HGBA1C in the last 72  hours. CBG: Recent Labs  Lab 11/25/19 1913 11/26/19 1655  GLUCAP 122* 103*   Lipid Profile: No results for input(s): CHOL, HDL, LDLCALC, TRIG, CHOLHDL, LDLDIRECT in the last 72 hours. Thyroid Function Tests: No results for input(s): TSH, T4TOTAL, FREET4, T3FREE, THYROIDAB in the last 72 hours. Anemia Panel: No results for input(s): VITAMINB12, FOLATE, FERRITIN, TIBC, IRON, RETICCTPCT in the last 72 hours. Sepsis Labs: Recent Labs  Lab 11/24/19 1720 11/25/19 1426 11/25/19 1938 11/26/19 0505  PROCALCITON  --  4.86  --   --   LATICACIDVEN 1.9 2.5* 1.7 1.7    Recent Results (from the past 240 hour(s))  Culture, blood (Routine x 2)     Status: None   Collection Time: 11/24/19  3:14 PM   Specimen: BLOOD  Result Value Ref Range Status   Specimen Description BLOOD BLOOD RIGHT HAND  Final   Special Requests   Final    BOTTLES DRAWN AEROBIC AND ANAEROBIC Blood Culture results may not be optimal due to an inadequate volume of blood received in culture bottles   Culture   Final    NO GROWTH 5 DAYS Performed at [facility], [address], [place], [address] [place]    Report Status 11/29/2019 FINAL  Final  Culture, blood (Routine x 2)     Status: Abnormal   Collection Time: 11/24/19  5:20 PM   Specimen: BLOOD  Result Value Ref Range Status   Specimen Description   Final    BLOOD BLOOD RIGHT ARM Performed at [facility], [address], [place], [place] [address]    Special Requests   Final    BOTTLES DRAWN AEROBIC AND ANAEROBIC Blood Culture adequate volume Performed at [facility], [address] [place], [place] [address]    Culture  Setup Time   Final    Organism ID to follow GRAM POSITIVE COCCI IN BOTH AEROBIC AND ANAEROBIC BOTTLES CRITICAL RESULT CALLED TO, READ BACK BY AND VERIFIED WITH: SCOTT HALL 11/25/19 AT 0530 HS Performed at [facility], [address] [place], [place] [address]    Culture STREPTOCOCCUS GROUP G (A)   Final   Report Status 11/27/2019 FINAL  Final   Organism ID, Bacteria STREPTOCOCCUS GROUP G  Final      Susceptibility   Streptococcus group g - MIC*    CLINDAMYCIN RESISTANT Resistant     AMPICILLIN <=0.25 SENSITIVE Sensitive     ERYTHROMYCIN >=8 RESISTANT Resistant     VANCOMYCIN 0.5 SENSITIVE Sensitive     CEFTRIAXONE <=0.12 SENSITIVE Sensitive     LEVOFLOXACIN 0.5 SENSITIVE Sensitive     * STREPTOCOCCUS GROUP G  Blood Culture ID Panel (Reflexed)     Status: Abnormal   Collection Time: 11/24/19  5:20 PM  Result Value Ref Range Status   Enterococcus faecalis NOT DETECTED NOT DETECTED Final   Enterococcus Faecium NOT DETECTED NOT DETECTED Final   Listeria monocytogenes NOT DETECTED NOT DETECTED Final   Staphylococcus species NOT DETECTED NOT DETECTED Final   Staphylococcus aureus (BCID) NOT DETECTED NOT DETECTED Final   Staphylococcus epidermidis NOT DETECTED NOT DETECTED Final   Staphylococcus lugdunensis NOT DETECTED NOT DETECTED Final   Streptococcus species DETECTED (A) NOT DETECTED Final    Comment: Not Enterococcus species, Streptococcus agalactiae, Streptococcus pyogenes, or Streptococcus pneumoniae. CRITICAL RESULT CALLED TO, READ BACK BY AND VERIFIED WITH: SCOTT HALL 11/25/19 AT 0530 HS    Streptococcus agalactiae NOT DETECTED NOT DETECTED Final   Streptococcus pneumoniae NOT DETECTED NOT DETECTED Final   Streptococcus pyogenes NOT DETECTED NOT DETECTED Final   A.calcoaceticus-baumannii NOT DETECTED NOT DETECTED Final   Bacteroides fragilis NOT DETECTED NOT DETECTED Final   Enterobacterales NOT DETECTED NOT DETECTED Final   Enterobacter cloacae complex NOT DETECTED NOT DETECTED Final   Escherichia coli NOT DETECTED NOT DETECTED Final   Klebsiella aerogenes NOT DETECTED NOT DETECTED Final   Klebsiella oxytoca NOT DETECTED NOT DETECTED Final   Klebsiella pneumoniae NOT DETECTED NOT DETECTED Final   Proteus species NOT DETECTED NOT DETECTED Final   Salmonella species NOT  DETECTED NOT DETECTED Final   Serratia marcescens NOT DETECTED NOT DETECTED Final   Haemophilus influenzae NOT DETECTED NOT DETECTED Final   Neisseria meningitidis NOT DETECTED NOT DETECTED Final   Pseudomonas aeruginosa NOT DETECTED NOT DETECTED Final   Stenotrophomonas maltophilia NOT DETECTED NOT DETECTED Final   Candida albicans NOT DETECTED NOT DETECTED Final   Candida auris NOT DETECTED NOT DETECTED Final   Candida glabrata NOT DETECTED NOT DETECTED Final   Candida krusei NOT DETECTED NOT DETECTED Final   Candida parapsilosis NOT DETECTED NOT DETECTED Final   Candida tropicalis NOT DETECTED NOT DETECTED Final   Cryptococcus neoformans/gattii NOT DETECTED NOT DETECTED Final    Comment: Performed at [facility], [address] [place], [place] [address]  SARS Coronavirus 2 by RT PCR (hospital order, performed in [facility] hospital lab) Nasopharyngeal Nasopharyngeal Swab     Status: None   Collection Time: 11/24/19  7:40 PM   Specimen: Nasopharyngeal Swab  Result Value Ref Range Status   SARS Coronavirus 2 NEGATIVE NEGATIVE Final    Comment: (NOTE) SARS-CoV-2 target nucleic acids are NOT DETECTED.  The SARS-CoV-2 RNA is generally detectable in upper and lower respiratory specimens during the acute phase of infection. The lowest concentration of SARS-CoV-2 viral copies this assay can detect is 250 copies / mL. A negative result does not preclude SARS-CoV-2 infection and should not be used as the sole basis for treatment or other patient management decisions.  A negative result may occur with improper specimen collection / handling, submission of specimen other than nasopharyngeal swab, presence of viral mutation(s) within the areas targeted by this assay, and inadequate number of viral copies (<250 copies / mL). A negative result must be combined with clinical observations, patient history, and epidemiological information.  Fact Sheet for Patients:     BoilerBrush.com.cy  Fact Sheet for Healthcare Providers: https://pope.com/  This test is not yet approved or  cleared by the [place] FDA and has been authorized for detection and/or diagnosis of SARS-CoV-2 by FDA under an Emergency Use Authorization (EUA).  This EUA will remain in effect (meaning this test can be used) for the duration of the COVID-19 declaration under Section 564(b)(1) of the Act, 21 U.S.C. section 360bbb-3(b)(1), unless the authorization is terminated or revoked sooner.  Performed at [facility], [address] [place], [place] [address]   C Difficile Quick Screen w PCR reflex     Status: None   Collection Time: 11/25/19 10:04 AM   Specimen: STOOL  Result Value Ref Range Status   C Diff antigen NEGATIVE NEGATIVE Final   C Diff toxin NEGATIVE NEGATIVE Final   C Diff interpretation No C. difficile detected.  Final    Comment: Performed at [facility], [address] [place], [place] [address]  MRSA PCR Screening     Status: None   Collection Time: 11/25/19  4:20 PM   Specimen: Nasopharyngeal  Result Value Ref Range Status   MRSA by PCR NEGATIVE NEGATIVE Final    Comment:        The GeneXpert MRSA Assay (FDA approved for NASAL specimens only), is one component of a comprehensive MRSA colonization surveillance program. It is not intended to diagnose MRSA infection nor to guide or monitor treatment for MRSA infections. Performed at [facility], [address], [place], [place] [address]   Aerobic Culture (superficial specimen)     Status: None (Preliminary result)   Collection Time: 11/27/19  4:50 PM   Specimen: Wound  Result Value Ref Range Status   Specimen Description   Final    WOUND Performed at [facility], [address], [place], [place] [address]    Special Requests   Final    LEFT LEG/SHIN Performed at [facility], [address] [place] [address] [place], [place] [address]    Gram Stain   Final    RARE WBC PRESENT,BOTH PMN AND MONONUCLEAR MODERATE GRAM POSITIVE COCCI FEW GRAM NEGATIVE RODS    Culture   Final    MODERATE PSEUDOMONAS AERUGINOSA CULTURE REINCUBATED FOR BETTER GROWTH Performed at [facility], [address] [place], [place] [address]    Report Status PENDING  Incomplete   Organism ID, Bacteria PSEUDOMONAS AERUGINOSA  Final      Susceptibility   Pseudomonas aeruginosa - MIC*    CEFTAZIDIME >=64 RESISTANT Resistant     CIPROFLOXACIN <=0.25 SENSITIVE Sensitive     GENTAMICIN <=1 SENSITIVE Sensitive     IMIPENEM 2 SENSITIVE Sensitive     * MODERATE PSEUDOMONAS AERUGINOSA  Virus culture     Status: None   Collection Time: 11/27/19  4:50 PM   Specimen: Leg, Left; Wound  Result Value Ref Range Status   Viral Culture Comment  Final    Comment: (NOTE) Preliminary Report: No virus isolated at 24 hours. Next report to follow after 4 days. Performed At: [facility] [address] [place], [place] [id number] Jolene SchimkeNagendra Sanjai MD WU:[id number]:[phone]    Source of Sample WD LEFT [facility]  Final    Comment: Performed at [facility], [address] [place], [place] [address]  Culture, blood (single) w Reflex to ID Panel     Status: None (Preliminary result)   Collection Time: 11/28/19  1:20 AM   Specimen: BLOOD  Result Value Ref Range Status   Specimen Description BLOOD RIGHT ANTECUBITAL  Final   Special Requests   Final    BOTTLES DRAWN AEROBIC AND ANAEROBIC Blood Culture results may not be optimal due to an inadequate volume of blood received in culture bottles   Culture   Final    NO GROWTH 3 DAYS Performed at [facility], [address] [place], [place] [address]    Report Status PENDING  Incomplete  CULTURE, BLOOD (ROUTINE X 2) w Reflex to ID Panel     Status: None (Preliminary result)   Collection Time: 12/01/19 12:49 AM   Specimen: BLOOD  Result Value Ref Range Status   Specimen  Description BLOOD RFOA  Final   Special Requests BOTTLES DRAWN AEROBIC AND ANAEROBIC BCAV  Final   Culture   Final    NO GROWTH < 12 HOURS Performed at [facility], [address] [place], [place] [address]    Report Status PENDING  Incomplete  CULTURE, BLOOD (ROUTINE X 2) w Reflex to ID Panel     Status: None (Preliminary result)   Collection Time: 12/01/19  1:05 AM   Specimen: BLOOD  Result Value Ref Range Status   Specimen Description BLOOD LEFT HAND  Final   Special Requests BOTTLES DRAWN AEROBIC AND ANAEROBIC BCAV  Final   Culture   Final    NO GROWTH < 12 HOURS Performed at [facility], [address] [place], [place] [address]    Report Status PENDING  Incomplete      Imaging Studies   DG Knee 1-2 Views Left  Result Date: 11/30/2019 CLINICAL DATA:  Left knee pain.  Bilateral lower extremity edema. EXAM: LEFT KNEE - 1-2 VIEW COMPARISON:  Knee radiograph 12/03/2016 FINDINGS: Mild tricompartmental peripheral spurring. Spurring of the tibial spines. Joint spaces are preserved. 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 Mild soft tissue edema versus habitus. Lobulated soft tissue calcifications in the posterolateral calf partially included, may be phleboliths. IMPRESSION: 1. Mild tricompartmental osteoarthritis. No acute osseous abnormality. 2. Curvilinear projection from the medial femoral condyle is unchanged from prior exam, favor sequela of remote prior MCL injury over osteochondroma. Electronically Signed   By: Narda RutherfordMelanie  Sanford M.D.   On: 11/30/2019 17:30     Medications   Scheduled  Meds: . amiodarone  400 mg Oral BID  . Chlorhexidine Gluconate Cloth  6 each Topical Daily  . diclofenac Sodium  2 g Topical QID  . furosemide  80 mg Intravenous BID  . metoprolol tartrate  75 mg Oral BID  . potassium chloride  40 mEq Oral Daily  . sodium  chloride flush  10-40 mL Intracatheter Q12H   Continuous Infusions: . sodium chloride Stopped (11/28/19 0255)  . sodium chloride 250 mL (11/30/19 2044)  .  ceFAZolin (ANCEF) IV 2 g (12/01/19 1433)  . ciprofloxacin 400 mg (12/01/19 1007)  . clindamycin (CLEOCIN) IV 900 mg (12/01/19 1433)  . heparin 2,800 Units/hr (12/01/19 1123)  . sodium chloride         LOS: 7 days    Time spent: 25 minutes with > 50% spent in coordination of care and direct patient contact.    Alfred Banter, DO [facility]  12/01/2019, 3:11 PM    If 7PM-7AM, please contact night-coverage. How to contact the [facility] Attending or Consulting provider 7A - 7P or covering provider during after hours 7P -7A, for this patient?    1. Check the care team in [facility] and look for a) attending/consulting [facility] provider listed and b) the [facility] team listed 2. Log into [email or web address] and use [place]'s universal password to access. If you do not have the password, please contact the hospital operator. 3. Locate the [facility] provider you are looking for under [facility] and page to a number that you can be directly reached. 4. If you still have difficulty reaching the provider, please page the [facility] (Director on Call) for the Hospitalists listed on amion for assistance.

## 2019-12-01 NOTE — Progress Notes (Signed)
ANTICOAGULATION CONSULT NOTE  Pharmacy Consult for heparin Indication: atrial flutter  No Known Allergies  Patient Measurements: Height: 6\' 7"  (200.7 cm) Weight: (!) 190.5 kg (420 lb) IBW/kg (Calculated) : 93.7 Heparin Dosing Weight: 141 kg (heparin adjusted body weight), initially used 113 kg  Vital Signs: Temp: 100.2 F (37.9 C) (08/27 0357) Temp Source: Oral (08/27 0357) BP: 133/67 (08/27 0357) Pulse Rate: 84 (08/27 0357)  Labs: Recent Labs    11/29/19 0324 11/29/19 0324 11/29/19 1352 11/29/19 2210 11/30/19 0412 12/01/19 0059  HGB 9.6*   < >  --   --  9.5* 8.9*  HCT 29.8*  --   --   --  30.5* 27.8*  PLT 228  --   --   --  217 223  HEPARINUNFRC 0.17*  --    < > 0.30 0.30 0.36  CREATININE 0.92  --   --   --   --  0.96   < > = values in this interval not displayed.    Estimated Creatinine Clearance: 166.6 mL/min (by C-G formula based on SCr of 0.96 mg/dL).   Medical History: Past Medical History:  Diagnosis Date  . Lower extremity edema   . Morbid obesity (HCC) 11/27/2019  . Tobacco abuse 11/27/2019    Assessment: 54 year old patient with strep group G bacteremia. In atrial flutter with RVR on amiodarone and diltiazem. Plan for possible cardioversion. Pharmacy to start on heparin drip.  8/23 1710 HL < 0.1 8/24 1050 HL < 0.1  8/24 1955 HL 0.15  8/25 0330 HL 0.17 subtherapeutic 8/25 1352 HL < 0.10  Goal of Therapy:  Heparin level 0.3-0.7 units/ml Monitor platelets by anticoagulation protocol: Yes   Plan:  08/27 @ 0100 HL 0.36 therapeutic. Will continue current rate at 2800 units/hr and will recheck HL w/ am labs, h/h trending down will continue to monitor.  9/27, PharmD, BCPS Clinical Pharmacist 12/01/2019 5:01 AM

## 2019-12-01 NOTE — Progress Notes (Signed)
Progress Note  Patient Name: Alfred [name] Date of Encounter: 12/01/2019  [facility] Cardiologist: [name], MD   Subjective   Reports breathing is slightly improved Intermittent fevers, Significant tenderness and more swelling on the left than the right More pitting edema noted on the right Notes indicating -1.8 L today  Maintining normal sinus rhythm Stable renal function creatinine 0.96 HCT 27  Inpatient Medications    Scheduled Meds: . Chlorhexidine Gluconate Cloth  6 each Topical Daily  . diclofenac Sodium  2 g Topical QID  . furosemide  80 mg Intravenous BID  . metoprolol tartrate  75 mg Oral BID  . potassium chloride  40 mEq Oral Daily  . sodium chloride flush  10-40 mL Intracatheter Q12H   Continuous Infusions: . sodium chloride Stopped (11/28/19 0255)  . sodium chloride 250 mL (11/30/19 2044)  . amiodarone 30 mg/hr (12/01/19 1128)  .  ceFAZolin (ANCEF) IV 2 g (12/01/19 5329)  . ciprofloxacin 400 mg (12/01/19 1007)  . clindamycin (CLEOCIN) IV 900 mg (12/01/19 0530)  . heparin 2,800 Units/hr (12/01/19 1123)  . sodium chloride     PRN Meds: sodium chloride, acetaminophen **OR** acetaminophen, albuterol, guaiFENesin, HYDROcodone-acetaminophen, ipratropium-albuterol, melatonin, morphine injection, ondansetron **OR** ondansetron (ZOFRAN) IV, sodium chloride flush   Vital Signs    Vitals:   11/30/19 2045 12/01/19 0357 12/01/19 0822 12/01/19 1157  BP:  133/67 (!) 141/66 109/64  Pulse:  84 81 69  Resp:  17 18 18   Temp:  100.2 F (37.9 C) 98.4 F (36.9 C) 97.7 F (36.5 C)  TempSrc:  Oral Oral Oral  SpO2: 95% 93% 96% 97%  Weight:  (!) 190.5 kg    Height:        Intake/Output Summary (Last 24 hours) at 12/01/2019 1424 Last data filed at 12/01/2019 1234 Gross per 24 hour  Intake 2007.25 ml  Output 2825 ml  Net -817.75 ml   Last 3 Weights 12/01/2019 11/30/2019 11/27/2019  Weight (lbs) 420 lb 422 lb 6.4 oz 434 lb 15.5 oz  Weight (kg)  190.511 kg 191.599 kg 197.3 kg      Telemetry    Atrial fib - Personally Reviewed  ECG     - Personally Reviewed  Physical Exam   GEN: No acute distress.   Neck: No JVD Cardiac: RRR, no murmurs, rubs, or gallops.  Respiratory: Clear to auscultation bilaterally. GI: Soft, nontender, non-distended  MS: No edema; No deformity. Neuro:  Nonfocal  Psych: Normal affect   Labs    High Sensitivity Troponin:   Recent Labs  Lab 11/26/19 1811 11/26/19 2000  TROPONINIHS 27* 27*      Chemistry Recent Labs  Lab 11/24/19 1720 11/25/19 1426 11/28/19 0108 11/29/19 0324 12/01/19 0059  NA 136   < > 138 138 136  K 4.1   < > 3.6 3.6 4.0  CL 99   < > 103 100 96*  CO2 26   < > 25 26 29   GLUCOSE 140*   < > 141* 132* 117*  BUN 15   < > 14 14 14   CREATININE 1.27*   < > 0.76 0.92 0.96  CALCIUM 8.4*   < > 8.0* 8.0* 7.8*  PROT 8.3*  --   --   --   --   ALBUMIN 3.2*  --   --   --   --   AST 47*  --   --   --   --   ALT 41  --   --   --   --  ALKPHOS 58  --   --   --   --   BILITOT 1.6*  --   --   --   --   GFRNONAA >60   < > >60 >60 >60  GFRAA >60   < > >60 >60 >60  ANIONGAP 11   < > 10 12 11    < > = values in this interval not displayed.     Hematology Recent Labs  Lab 11/29/19 0324 11/30/19 0412 12/01/19 0059  WBC 14.4* 14.7* 14.1*  RBC 4.38 4.45 4.09*  HGB 9.6* 9.5* 8.9*  HCT 29.8* 30.5* 27.8*  MCV 68.0* 68.5* 68.0*  MCH 21.9* 21.3* 21.8*  MCHC 32.2 31.1 32.0  RDW 16.4* 16.3* 16.3*  PLT 228 217 223    BNPNo results for input(s): BNP, PROBNP in the last 168 hours.   DDimer No results for input(s): DDIMER in the last 168 hours.   Radiology    DG Knee 1-2 Views Left  Result Date: 11/30/2019 CLINICAL DATA:  Left knee pain.  Bilateral lower extremity edema. EXAM: LEFT KNEE - 1-2 VIEW COMPARISON:  Knee radiograph 12/03/2016 FINDINGS: Mild tricompartmental peripheral spurring. Spurring of the tibial spines. Joint spaces are preserved. 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 Mild soft tissue edema versus habitus. Lobulated soft tissue calcifications in the posterolateral calf partially included, may be phleboliths. IMPRESSION: 1. Mild tricompartmental osteoarthritis. No acute osseous abnormality. 2. Curvilinear projection from the medial femoral condyle is unchanged from prior exam, favor sequela of remote prior MCL injury over osteochondroma. Electronically Signed   By: 12/05/2016 M.D.   On: 11/30/2019 17:30    Cardiac Studies   Echo details as above  Patient Profile     54 y.o. male with history of morbid obesity, tobacco abuse, admitted due to edema found to have atrial flutter on 11/2019.  Spontaneously converted to sinus rhythm on 11/2022.  Assessment & Plan    Atrial flutter, typical Converted to normal sinus rhythm Amiodarone infusion discontinued Started on 400 twice daily for more days then down to 200 twice daily --Continue heparin infusion with transition to a NOAC at discharge  Acute on chronic diastolic CHF In the setting of arrhythmia, morbid obesity, suspected sleep apnea --- Massive edema in the legs on exam, pitting edema extending into the thighs and sacral area There has been some improvement in the past 24 hours still with significant leg swelling on the left more than the right, severe tenderness left lower extremity (Unable to exclude DVT) -Would continue IV Lasix twice daily, heparin  Streptococcus group B bacteremia On broad-spectrum antibiotics, followed by ID  Left leg tenderness Notably more swollen than the right Despite lower extremity venous Doppler several days ago, unable to exclude DVT He is on heparin infusion.  Would likely need chronic anticoagulation given his debility, sedentary lifestyle   Total encounter time more than 25 minutes  Greater than 50% was spent in counseling and coordination  of care with the patient    For questions or updates, please contact [facility] Please consult [email or web address] for contact info under        Signed, 12/2022, MD  12/01/2019, 2:24 PM

## 2019-12-01 NOTE — Progress Notes (Signed)
[facility] CLINIC INFECTIOUS DISEASE PROGRESS NOTE Date of Admission:  11/24/2019     ID: Alfred [name] is a 54 y.o. male with  Strep bacteremia  Principal Problem:   Streptococcal sepsis, unspecified (HCC) Active Problems:   Cellulitis of left leg   AKI (acute kidney injury) (HCC)   Bacteremia   Lower extremity edema   Morbid obesity (HCC)   Tobacco abuse   Paroxysmal atrial fibrillation (HCC)   Acute on chronic heart failure with preserved ejection fraction (HFpEF) (HCC)   Atrial flutter (HCC)   Subjective: No fevers, reports feeling better  ROS  Eleven systems are reviewed and negative except per hpi  Medications:  Antibiotics Given (last 72 hours)    Date/Time Action Medication Dose Rate   11/29/19 0646 New Bag/Given   Ampicillin-Sulbactam (UNASYN) 3 g in sodium chloride 0.9 % 100 mL IVPB 3 g 200 mL/hr   11/29/19 1257 New Bag/Given   Ampicillin-Sulbactam (UNASYN) 3 g in sodium chloride 0.9 % 100 mL IVPB 3 g 200 mL/hr   11/29/19 1810 New Bag/Given   Ampicillin-Sulbactam (UNASYN) 3 g in sodium chloride 0.9 % 100 mL IVPB 3 g 200 mL/hr   11/30/19 0110 New Bag/Given   Ampicillin-Sulbactam (UNASYN) 3 g in sodium chloride 0.9 % 100 mL IVPB 3 g 200 mL/hr   11/30/19 0615 New Bag/Given   Ampicillin-Sulbactam (UNASYN) 3 g in sodium chloride 0.9 % 100 mL IVPB 3 g 200 mL/hr   11/30/19 1154 New Bag/Given   Ampicillin-Sulbactam (UNASYN) 3 g in sodium chloride 0.9 % 100 mL IVPB 3 g 200 mL/hr   11/30/19 1804 New Bag/Given   ceFAZolin (ANCEF) IVPB 2g/100 mL premix 2 g 200 mL/hr   11/30/19 1851 New Bag/Given   clindamycin (CLEOCIN) IVPB 900 mg 900 mg 100 mL/hr   11/30/19 2046 New Bag/Given   ciprofloxacin (CIPRO) IVPB 400 mg 400 mg 200 mL/hr   12/01/19 0530 New Bag/Given   clindamycin (CLEOCIN) IVPB 900 mg 900 mg 100 mL/hr   12/01/19 0648 New Bag/Given   ceFAZolin (ANCEF) IVPB 2g/100 mL premix 2 g 200 mL/hr   12/01/19 1007 New Bag/Given   ciprofloxacin (CIPRO) IVPB 400 mg 400  mg 200 mL/hr   12/01/19 1433 New Bag/Given   ceFAZolin (ANCEF) IVPB 2g/100 mL premix 2 g 200 mL/hr   12/01/19 1433 New Bag/Given   clindamycin (CLEOCIN) IVPB 900 mg 900 mg 100 mL/hr     . amiodarone  400 mg Oral BID  . Chlorhexidine Gluconate Cloth  6 each Topical Daily  . diclofenac Sodium  2 g Topical QID  . furosemide  80 mg Intravenous BID  . metoprolol tartrate  75 mg Oral BID  . potassium chloride  40 mEq Oral Daily  . sodium chloride flush  10-40 mL Intracatheter Q12H    Objective: Vital signs in last 24 hours: Temp:  [97.7 F (36.5 C)-100.2 F (37.9 C)] 98.6 F (37 C) (08/27 1553) Pulse Rate:  [69-86] 73 (08/27 1553) Resp:  [16-18] 18 (08/27 1553) BP: (107-147)/(61-68) 107/61 (08/27 1553) SpO2:  [93 %-97 %] 96 % (08/27 1553) Weight:  [190.5 kg] 190.5 kg (08/27 0357) No distress until the leg is touched Chest b/l air entry, crepts bases Hss1s2 Abd - obese- soft Extremities bil  edema legs, Wrapped  Lab Results Recent Labs    11/29/19 0324 11/29/19 0324 11/30/19 0412 12/01/19 0059  WBC 14.4*   < > 14.7* 14.1*  HGB 9.6*   < > 9.5* 8.9*  HCT 29.8*   < >  30.5* 27.8*  NA 138  --   --  136  K 3.6  --   --  4.0  CL 100  --   --  96*  CO2 26  --   --  29  BUN 14  --   --  14  CREATININE 0.92  --   --  0.96   < > = values in this interval not displayed.    Microbiology: Results for orders placed or performed during the hospital encounter of 11/24/19  Culture, blood (Routine x 2)     Status: None   Collection Time: 11/24/19  3:14 PM   Specimen: BLOOD  Result Value Ref Range Status   Specimen Description BLOOD BLOOD RIGHT HAND  Final   Special Requests   Final    BOTTLES DRAWN AEROBIC AND ANAEROBIC Blood Culture results may not be optimal due to an inadequate volume of blood received in culture bottles   Culture   Final    NO GROWTH 5 DAYS Performed at [facility], [address] [place], [place] [address]    Report Status 11/29/2019 FINAL   Final  Culture, blood (Routine x 2)     Status: Abnormal   Collection Time: 11/24/19  5:20 PM   Specimen: BLOOD  Result Value Ref Range Status   Specimen Description   Final    BLOOD BLOOD RIGHT ARM Performed at [facility], [address] [place], [place] [address]    Special Requests   Final    BOTTLES DRAWN AEROBIC AND ANAEROBIC Blood Culture adequate volume Performed at [facility], [address] [place], [place] [address]    Culture  Setup Time   Final    Organism ID to follow GRAM POSITIVE COCCI IN BOTH AEROBIC AND ANAEROBIC BOTTLES CRITICAL RESULT CALLED TO, READ BACK BY AND VERIFIED WITH: SCOTT HALL 11/25/19 AT 0530 HS Performed at [facility], [address] [place], [place] [address]    Culture STREPTOCOCCUS GROUP G (A)  Final   Report Status 11/27/2019 FINAL  Final   Organism ID, Bacteria STREPTOCOCCUS GROUP G  Final      Susceptibility   Streptococcus group g - MIC*    CLINDAMYCIN RESISTANT Resistant     AMPICILLIN <=0.25 SENSITIVE Sensitive     ERYTHROMYCIN >=8 RESISTANT Resistant     VANCOMYCIN 0.5 SENSITIVE Sensitive     CEFTRIAXONE <=0.12 SENSITIVE Sensitive     LEVOFLOXACIN 0.5 SENSITIVE Sensitive     * STREPTOCOCCUS GROUP G  Blood Culture ID Panel (Reflexed)     Status: Abnormal   Collection Time: 11/24/19  5:20 PM  Result Value Ref Range Status   Enterococcus faecalis NOT DETECTED NOT DETECTED Final   Enterococcus Faecium NOT DETECTED NOT DETECTED Final   Listeria monocytogenes NOT DETECTED NOT DETECTED Final   Staphylococcus species NOT DETECTED NOT DETECTED Final   Staphylococcus aureus (BCID) NOT DETECTED NOT DETECTED Final   Staphylococcus epidermidis NOT DETECTED NOT DETECTED Final   Staphylococcus lugdunensis NOT DETECTED NOT DETECTED Final   Streptococcus species DETECTED (A) NOT DETECTED Final    Comment: Not Enterococcus species, Streptococcus agalactiae, Streptococcus pyogenes, or Streptococcus  pneumoniae. CRITICAL RESULT CALLED TO, READ BACK BY AND VERIFIED WITH: SCOTT HALL 11/25/19 AT 0530 HS    Streptococcus agalactiae NOT DETECTED NOT DETECTED Final   Streptococcus pneumoniae NOT DETECTED NOT DETECTED Final   Streptococcus pyogenes NOT DETECTED NOT DETECTED Final   A.calcoaceticus-baumannii NOT DETECTED NOT DETECTED Final  Bacteroides fragilis NOT DETECTED NOT DETECTED Final   Enterobacterales NOT DETECTED NOT DETECTED Final   Enterobacter cloacae complex NOT DETECTED NOT DETECTED Final   Escherichia coli NOT DETECTED NOT DETECTED Final   Klebsiella aerogenes NOT DETECTED NOT DETECTED Final   Klebsiella oxytoca NOT DETECTED NOT DETECTED Final   Klebsiella pneumoniae NOT DETECTED NOT DETECTED Final   Proteus species NOT DETECTED NOT DETECTED Final   Salmonella species NOT DETECTED NOT DETECTED Final   Serratia marcescens NOT DETECTED NOT DETECTED Final   Haemophilus influenzae NOT DETECTED NOT DETECTED Final   Neisseria meningitidis NOT DETECTED NOT DETECTED Final   Pseudomonas aeruginosa NOT DETECTED NOT DETECTED Final   Stenotrophomonas maltophilia NOT DETECTED NOT DETECTED Final   Candida albicans NOT DETECTED NOT DETECTED Final   Candida auris NOT DETECTED NOT DETECTED Final   Candida glabrata NOT DETECTED NOT DETECTED Final   Candida krusei NOT DETECTED NOT DETECTED Final   Candida parapsilosis NOT DETECTED NOT DETECTED Final   Candida tropicalis NOT DETECTED NOT DETECTED Final   Cryptococcus neoformans/gattii NOT DETECTED NOT DETECTED Final    Comment: Performed at [facility], [address] [place], [place] [address]  SARS Coronavirus 2 by RT PCR (hospital order, performed in [facility] hospital lab) Nasopharyngeal Nasopharyngeal Swab     Status: None   Collection Time: 11/24/19  7:40 PM   Specimen: Nasopharyngeal Swab  Result Value Ref Range Status   SARS Coronavirus 2 NEGATIVE NEGATIVE Final    Comment: (NOTE) SARS-CoV-2 target nucleic acids are  NOT DETECTED.  The SARS-CoV-2 RNA is generally detectable in upper and lower respiratory specimens during the acute phase of infection. The lowest concentration of SARS-CoV-2 viral copies this assay can detect is 250 copies / mL. A negative result does not preclude SARS-CoV-2 infection and should not be used as the sole basis for treatment or other patient management decisions.  A negative result may occur with improper specimen collection / handling, submission of specimen other than nasopharyngeal swab, presence of viral mutation(s) within the areas targeted by this assay, and inadequate number of viral copies (<250 copies / mL). A negative result must be combined with clinical observations, patient history, and epidemiological information.  Fact Sheet for Patients:   BoilerBrush.com.cy  Fact Sheet for Healthcare Providers: https://pope.com/  This test is not yet approved or  cleared by the [place] FDA and has been authorized for detection and/or diagnosis of SARS-CoV-2 by FDA under an Emergency Use Authorization (EUA).  This EUA will remain in effect (meaning this test can be used) for the duration of the COVID-19 declaration under Section 564(b)(1) of the Act, 21 U.S.C. section 360bbb-3(b)(1), unless the authorization is terminated or revoked sooner.  Performed at [facility], [address] [place], [place] [address]   C Difficile Quick Screen w PCR reflex     Status: None   Collection Time: 11/25/19 10:04 AM   Specimen: STOOL  Result Value Ref Range Status   C Diff antigen NEGATIVE NEGATIVE Final   C Diff toxin NEGATIVE NEGATIVE Final   C Diff interpretation No C. difficile detected.  Final    Comment: Performed at [facility], [address] [place], [place] [address]  MRSA PCR Screening     Status: None   Collection Time: 11/25/19  4:20 PM   Specimen: Nasopharyngeal  Result Value Ref Range  Status   MRSA by PCR NEGATIVE NEGATIVE Final    Comment:  The GeneXpert MRSA Assay (FDA approved for NASAL specimens only), is one component of a comprehensive MRSA colonization surveillance program. It is not intended to diagnose MRSA infection nor to guide or monitor treatment for MRSA infections. Performed at [facility] [address], [place], [place] [address]   Aerobic Culture (superficial specimen)     Status: None (Preliminary result)   Collection Time: 11/27/19  4:50 PM   Specimen: Wound  Result Value Ref Range Status   Specimen Description   Final    WOUND Performed at [facility], [address], [place], [place] [address]    Special Requests   Final    LEFT LEG/SHIN Performed at [facility], [address] [place], [place] [address]    Gram Stain   Final    RARE WBC PRESENT,BOTH PMN AND MONONUCLEAR MODERATE GRAM POSITIVE COCCI FEW GRAM NEGATIVE RODS    Culture   Final    MODERATE PSEUDOMONAS AERUGINOSA CULTURE REINCUBATED FOR BETTER GROWTH Performed at [facility], [address], [place], [place] [address]    Report Status PENDING  Incomplete   Organism ID, Bacteria PSEUDOMONAS AERUGINOSA  Final      Susceptibility   Pseudomonas aeruginosa - MIC*    CEFTAZIDIME >=64 RESISTANT Resistant     CIPROFLOXACIN <=0.25 SENSITIVE Sensitive     GENTAMICIN <=1 SENSITIVE Sensitive     IMIPENEM 2 SENSITIVE Sensitive     * MODERATE PSEUDOMONAS AERUGINOSA  Virus culture     Status: None   Collection Time: 11/27/19  4:50 PM   Specimen: Leg, Left; Wound  Result Value Ref Range Status   Viral Culture Comment  Final    Comment: (NOTE) Preliminary Report: No virus isolated at 24 hours. Next report to follow after 4 days. Performed At: [facility] [address] [place], [place] [id number] Jolene Schimke MD EY:[id number]    Source of Sample WD LEFT [facility]  Final    Comment: Performed at [facility],  [address] [place], [place] [address]  Culture, blood (single) w Reflex to ID Panel     Status: None (Preliminary result)   Collection Time: 11/28/19  1:20 AM   Specimen: BLOOD  Result Value Ref Range Status   Specimen Description BLOOD RIGHT ANTECUBITAL  Final   Special Requests   Final    BOTTLES DRAWN AEROBIC AND ANAEROBIC Blood Culture results may not be optimal due to an inadequate volume of blood received in culture bottles   Culture   Final    NO GROWTH 3 DAYS Performed at [facility], [address], [place], [place] [address]    Report Status PENDING  Incomplete  CULTURE, BLOOD (ROUTINE X 2) w Reflex to ID Panel     Status: None (Preliminary result)   Collection Time: 12/01/19 12:49 AM   Specimen: BLOOD  Result Value Ref Range Status   Specimen Description BLOOD RFOA  Final   Special Requests BOTTLES DRAWN AEROBIC AND ANAEROBIC BCAV  Final   Culture   Final    NO GROWTH < 12 HOURS Performed at [facility], [address] [place], [place] [address]    Report Status PENDING  Incomplete  CULTURE, BLOOD (ROUTINE X 2) w Reflex to ID Panel     Status: None (Preliminary result)   Collection Time: 12/01/19  1:05 AM   Specimen: BLOOD  Result Value Ref Range Status   Specimen Description BLOOD LEFT HAND  Final  Special Requests BOTTLES DRAWN AEROBIC AND ANAEROBIC BCAV  Final   Culture   Final    NO GROWTH < 12 HOURS Performed at [facility], [address] [place], [place] [address]    Report Status PENDING  Incomplete    Studies/Results: DG Knee 1-2 Views Left  Result Date: 11/30/2019 CLINICAL DATA:  Left knee pain.  Bilateral lower extremity edema. EXAM: LEFT KNEE - 1-2 VIEW COMPARISON:  Knee radiograph 12/03/2016 FINDINGS: Mild tricompartmental peripheral spurring. Spurring of the tibial spines. Joint spaces are preserved. Curvilinear projection from the medial femoral condyle is unchanged from prior exam, favor remote prior MCL  injury over osteochondroma no joint effusion. Mild soft tissue edema versus habitus. Lobulated soft tissue calcifications in the posterolateral calf partially included, may be phleboliths. IMPRESSION: 1. Mild tricompartmental osteoarthritis. No acute osseous abnormality. 2. Curvilinear projection from the medial femoral condyle is unchanged from prior exam, favor sequela of remote prior MCL injury over osteochondroma. Electronically Signed   By: Narda Rutherford M.D.   On: 11/30/2019 17:30    Assessment/Plan: Streptococcus Group G bacteremia Source unclear- possible  Legs ( left) but culture neg  need to r/o endocarditis- will need TEE_ seen by cardiologist plan is to take him for TEE once CHF better controlled  cellulitis left leg- with recent fever and leucocytosis Changed unasyn to cefazolin and clinda and added cipro for Pseudomonas and fevers improved.  Can dc clinda tomorrow if continues to improve   Bilateral venous edema with punched-out lesions on the right leg and circular lesions on the left leg. This is unusual for cellulitis and need to rule out HSV or erosive lichen planus.  A. fib-reverted to sinus On Amiodarone Watch while on cipro Thank you very much for the consult. Will follow with you.  Mick Sell   12/01/2019, 4:20 PM

## 2019-12-02 LAB — AEROBIC CULTURE W GRAM STAIN (SUPERFICIAL SPECIMEN)

## 2019-12-02 LAB — BASIC METABOLIC PANEL
Anion gap: 5 (ref 5–15)
BUN: 13 mg/dL (ref 6–20)
CO2: 33 mmol/L — ABNORMAL HIGH (ref 22–32)
Calcium: 7.5 mg/dL — ABNORMAL LOW (ref 8.9–10.3)
Chloride: 96 mmol/L — ABNORMAL LOW (ref 98–111)
Creatinine, Ser: 0.82 mg/dL (ref 0.61–1.24)
GFR calc Af Amer: >60 mL/min (ref >60)
GFR calc non Af Amer: >60 mL/min (ref >60)
Glucose, Bld: 118 mg/dL — ABNORMAL HIGH (ref 70–99)
Potassium: 3.7 mmol/L (ref 3.5–5.1)
Sodium: 134 mmol/L — ABNORMAL LOW (ref 135–145)

## 2019-12-02 LAB — CBC WITH DIFFERENTIAL/PLATELET
Abs Immature Granulocytes: 0.43 10*3/uL — ABNORMAL HIGH (ref 0.00–0.07)
Basophils Absolute: 0.1 10*3/uL (ref 0.0–0.1)
Basophils Relative: 0 %
Eosinophils Absolute: 0.1 10*3/uL (ref 0.0–0.5)
Eosinophils Relative: 1 %
HCT: 26.8 % — ABNORMAL LOW (ref 39.0–52.0)
Hemoglobin: 8.5 g/dL — ABNORMAL LOW (ref 13.0–17.0)
Immature Granulocytes: 4 %
Lymphocytes Relative: 8 %
Lymphs Abs: 1 10*3/uL (ref 0.7–4.0)
MCH: 21.6 pg — ABNORMAL LOW (ref 26.0–34.0)
MCHC: 31.7 g/dL (ref 30.0–36.0)
MCV: 68 fL — ABNORMAL LOW (ref 80.0–100.0)
Monocytes Absolute: 1 10*3/uL (ref 0.1–1.0)
Monocytes Relative: 8 %
Neutro Abs: 9.8 10*3/uL — ABNORMAL HIGH (ref 1.7–7.7)
Neutrophils Relative %: 79 %
Platelets: 252 10*3/uL (ref 150–400)
RBC: 3.94 MIL/uL — ABNORMAL LOW (ref 4.22–5.81)
RDW: 16.1 % — ABNORMAL HIGH (ref 11.5–15.5)
WBC: 12.3 10*3/uL — ABNORMAL HIGH (ref 4.0–10.5)
nRBC: 0 % (ref 0.0–0.2)

## 2019-12-02 LAB — VITAMIN B12: Vitamin B-12: 681 pg/mL (ref 180–914)

## 2019-12-02 LAB — IRON AND TIBC
Iron: 24 ug/dL — ABNORMAL LOW (ref 45–182)
Saturation Ratios: 10 % — ABNORMAL LOW (ref 17.9–39.5)
TIBC: 245 ug/dL — ABNORMAL LOW (ref 250–450)
UIBC: 221 ug/dL

## 2019-12-02 LAB — FOLATE: Folate: 13.9 ng/mL (ref >5.9)

## 2019-12-02 LAB — HEPARIN LEVEL (UNFRACTIONATED)
Heparin Unfractionated: 2.2 IU/mL — ABNORMAL HIGH (ref 0.30–0.70)
Heparin Unfractionated: 0.23 IU/mL — ABNORMAL LOW (ref 0.30–0.70)
Heparin Unfractionated: 0.12 IU/mL — ABNORMAL LOW (ref 0.30–0.70)

## 2019-12-02 LAB — FERRITIN: Ferritin: 665 ng/mL — ABNORMAL HIGH (ref 24–336)

## 2019-12-02 LAB — RETICULOCYTES
Immature Retic Fract: 29.4 % — ABNORMAL HIGH (ref 2.3–15.9)
RBC.: 3.76 MIL/uL — ABNORMAL LOW (ref 4.22–5.81)
Retic Count, Absolute: 43.2 10*3/uL (ref 19.0–186.0)
Retic Ct Pct: 1.2 % (ref 0.4–3.1)

## 2019-12-02 MED ORDER — SENNA 8.6 MG PO TABS
2.0000 | ORAL_TABLET | Freq: Every day | ORAL | Status: DC
  Administered 2019-12-02 – 2019-12-07 (×6): 17.2 mg via ORAL
  Filled 2019-12-02 (×9): qty 2

## 2019-12-02 MED ORDER — POLYETHYLENE GLYCOL 3350 17 G PO PACK
17.0000 g | PACK | Freq: Every day | ORAL | Status: DC
  Administered 2019-12-02 – 2019-12-10 (×5): 17 g via ORAL
  Filled 2019-12-02 (×7): qty 1

## 2019-12-02 MED ORDER — BISACODYL 5 MG PO TBEC: 5.0000 mg | DELAYED_RELEASE_TABLET | Freq: Every day | ORAL | Status: DC | PRN

## 2019-12-02 MED ORDER — HEPARIN (PORCINE) 25000 UT/250ML-% IV SOLN
2900.0000 [IU]/h | INTRAVENOUS | Status: DC
  Administered 2019-12-02: 2800 [IU]/h via INTRAVENOUS
  Administered 2019-12-02 (×2): 2500 [IU]/h via INTRAVENOUS
  Administered 2019-12-03 – 2019-12-06 (×7): 2900 [IU]/h via INTRAVENOUS
  Filled 2019-12-02 (×11): qty 250

## 2019-12-02 MED ORDER — HEPARIN BOLUS VIA INFUSION
2000.0000 [IU] | Freq: Once | INTRAVENOUS | Status: AC
  Administered 2019-12-02: 2000 [IU] via INTRAVENOUS
  Filled 2019-12-02: qty 2000

## 2019-12-02 NOTE — Progress Notes (Signed)
PROGRESS NOTE    Alfred [name]   ZOX:[id number]  DOB: 11-27-1965  PCP: Patient, No Pcp Per    DOA: 11/24/2019 LOS: 8   Brief Narrative   Alfred [name] is a 54 y.o. male with history of chronic lower extremity venous stasis for the past 15 years but otherwise no medical history and on no medications who presented to the ED on 11/24/19 with 2-week history of progressively worsening bilateral lower extremity swelling, left greater than right associated with pain and skin breakdown with oozing. Reported subjective fevers.  In the ED, low-grade temperature of 99, tachycardic at 122 bpm, with normal blood pressure.  WBC normal at 9100, lactic acid borderline at 1.9.  Blood work showing creatinine of 1.27 above baseline of 1.16 but with prior baseline around 0.7.  Urinalysis showing proteinuria.  Chest x-ray showed no active disease.  Bilateral lower extremity Doppler negative for DVT bilaterally, but was suboptimal study due to body habitus and edema.    Admitted to hospitalist service.  Blood cultures positive for group G strep.  Being treated with IV antibiotics for cellulitis and bacteremia.       Assessment & Plan   Principal Problem:   Streptococcal sepsis, unspecified (HCC) Active Problems:   Cellulitis of left leg   AKI (acute kidney injury) (HCC)   Bacteremia   Lower extremity edema   Morbid obesity (HCC)   Tobacco abuse   Paroxysmal atrial fibrillation (HCC)   Acute on chronic heart failure with preserved ejection fraction (HFpEF) (HCC)   Atrial flutter (HCC)   Acute on chronic HFpEF - presented fluid overloaded, massive edema of BLE's up to proximal thighs and abdomen.  Slowly improving with IV diuresis.  Cardiology following.  Continue Lasix 80 mg IV BID.  Monitor I/O's, daily weights.  Low sodium diet.  Will require several days aggressive IV diuresis.  Net IO Since Admission: -12,984.46 mL [12/02/19 1540]   Intake/Output Summary (Last 24 hours) at  12/02/2019 1541 Last data filed at 12/02/2019 1345 Gross per 24 hour  Intake 1316 ml  Output 4200 ml  Net -2884 ml    Sepsis secondary to group G Strep bacteremia - sepsis POA as evidenced by fever, tachycardia, and known source(s) of infection.  ID is following.  Continue Unasyn (initially was on Vanc Rocephin).  Monitor CBC.  Will need TEE to rule out endocarditis (cardiology wants further diuresis beforehand).  Follow up repeat blood cultures.       Cellulitis of left leg - POA, due to severe venous stasis.  Antibiotic as above.  Wound care.  PRN analgesics.  AKI (acute kidney injury) - POA, improving.  Monitor BMP.  Lower extremity edema - chronic venous stasis.  Wound care.  Monitor.  PT recommends SNF. Venous dopplers of lower extremities on admission were unable to exclude DVT, suboptimal study due to body habitus.  Atrial flutter - Currently sinus rhythm.  CHA2DS2-VASc of 1.  Cardiology following.  Continue heparin gtt, transition to DOAC on discharge.  Continue PO Amiodarone.  Continue Lopressor 50 mg PO BID.    Microcytic anemia - mild on admission, progressive.  Hbg trend from admission forward: 12.9>>10.9>>10.2>>...8.9 today.   Anemia panel with AM labs.  Check FOBT.  Monitor CBC.  Hepatitis C - will need GI follow up outpatient to discuss treatment.  Viral load > 2.8million.   Morbid obesity - Body mass index is 46.89 kg/m. Complicates overall care and prognosis.  Tobacco abuse - counseled on cessation.  DVT prophylaxis: on heparin gtt    Diet:  Diet Orders (From admission, onward)    Start     Ordered   11/28/19 0756  Diet heart healthy/carb modified Room service appropriate? Yes; Fluid consistency: Thin  Diet effective now       Question Answer Comment  Diet-HS Snack? Nothing   Room service appropriate? Yes   Fluid consistency: Thin      11/28/19 0755            Code Status: Full Code    Subjective 12/02/19    Patient seen this AM at bedside.  Says  he is feeling overall well.  Left knee feels better with Voltaren gel.  Says his legs are still achy but a little better.  Denies any other acute complaints including fevers or chills, chest pain or shortness of breath, nausea vomiting or diarrhea.   Disposition Plan & Communication   Status is: Inpatient  Remains inpatient appropriate because:IV treatments appropriate due to intensity of illness or inability to take PO.   Ongoing IV diuresis and IV antibiotics for bacteremia.  Dispo: The patient is from: Home              Anticipated d/c is to: Home              Anticipated d/c date is: > 3 days              Patient currently is not medically stable to d/c.    Family Communication: none at bedside, will attempt to call   Consults, Procedures, Significant Events   Consultants:   Cardiology  Infectious disease  Wound care RN  Procedures:   None  Antimicrobials:   Unasyn    Objective   Vitals:   12/01/19 2011 12/02/19 0517 12/02/19 0539 12/02/19 0728  BP: (!) 141/91 (!) 126/52  136/67  Pulse: 83 76  74  Resp:    18  Temp: 100 F (37.8 C) 100 F (37.8 C)  98.3 F (36.8 C)  TempSrc: Oral Oral  Oral  SpO2: 100% 97%  96%  Weight:   (!) 188.8 kg   Height:        Intake/Output Summary (Last 24 hours) at 12/02/2019 8469 Last data filed at 12/02/2019 0700 Gross per 24 hour  Intake 1076 ml  Output 5475 ml  Net -4399 ml   Filed Weights   11/30/19 0439 12/01/19 0357 12/02/19 0539  Weight: (!) 191.6 kg (!) 190.5 kg (!) 188.8 kg    Physical Exam:  General exam: awake, alert, no acute distress, obese Respiratory system: CTAB but diminished due to body habitus, normal respiratory effort. Cardiovascular system: normal S1/S2, RRR, bilateral LE pitting/indurated edema proximal to leg wraps up to at least proximal thighs and lower trunk Central nervous system: A&O x3. no gross focal neurologic deficits, normal speech Extremities: b/l LE's wrapped and offloading  boots in place.  Pitting dema as above Psychiatry: normal mood, congruent affect  Labs   Data Reviewed: I have personally reviewed following labs and imaging studies  CBC: Recent Labs  Lab 11/26/19 0840 11/26/19 0840 11/27/19 0426 11/28/19 0108 11/28/19 1050 11/29/19 0324 11/30/19 0412 12/01/19 0059 12/02/19 0532  WBC 8.6   < > 8.9   < > 12.0* 14.4* 14.7* 14.1* 12.3*  NEUTROABS 7.3  --  7.4  --   --   --  11.4*  --  9.8*  HGB 10.2*   < > 9.8*   < > 9.9*  9.6* 9.5* 8.9* 8.5*  HCT 33.1*   < > 32.6*   < > 32.2* 29.8* 30.5* 27.8* 26.8*  MCV 68.8*   < > 70.9*   < > 70.2* 68.0* 68.5* 68.0* 68.0*  PLT 142*   < > 157   < > 240 228 217 223 252   < > = values in this interval not displayed.   Basic Metabolic Panel: Recent Labs  Lab 11/25/19 1938 11/26/19 0505 11/26/19 1512 11/27/19 0426 11/28/19 0108 11/29/19 0324 12/01/19 0059 12/02/19 0532  NA 134*   < >  --  137 138 138 136 134*  K 3.0*   < >  --  3.5 3.6 3.6 4.0 3.7  CL 97*   < >  --  103 103 100 96* 96*  CO2 23   < >  --  25 25 26 29  33*  GLUCOSE 124*   < >  --  129* 141* 132* 117* 118*  BUN 15   < >  --  15 14 14 14 13   CREATININE 1.23   < >  --  0.87 0.76 0.92 0.96 0.82  CALCIUM 7.9*   < >  --  8.1* 8.0* 8.0* 7.8* 7.5*  MG 2.1  --  2.5*  --   --   --  1.9  --   PHOS  --   --  2.3*  --   --   --   --   --    < > = values in this interval not displayed.   GFR: Estimated Creatinine Clearance: 194.1 mL/min (by C-G formula based on SCr of 0.82 mg/dL). Liver Function Tests: No results for input(s): AST, ALT, ALKPHOS, BILITOT, PROT, ALBUMIN in the last 168 hours. No results for input(s): LIPASE, AMYLASE in the last 168 hours. No results for input(s): AMMONIA in the last 168 hours. Coagulation Profile: Recent Labs  Lab 11/25/19 1426  INR 1.2   Cardiac Enzymes: No results for input(s): CKTOTAL, CKMB, CKMBINDEX, TROPONINI in the last 168 hours. BNP (last 3 results) No results for input(s): PROBNP in the last 8760  hours. HbA1C: No results for input(s): HGBA1C in the last 72 hours. CBG: Recent Labs  Lab 11/25/19 1913 11/26/19 1655  GLUCAP 122* 103*   Lipid Profile: No results for input(s): CHOL, HDL, LDLCALC, TRIG, CHOLHDL, LDLDIRECT in the last 72 hours. Thyroid Function Tests: No results for input(s): TSH, T4TOTAL, FREET4, T3FREE, THYROIDAB in the last 72 hours. Anemia Panel: Recent Labs    12/02/19 0532  FOLATE 13.9  FERRITIN 665*  TIBC 245*  IRON 24*  RETICCTPCT 1.2   Sepsis Labs: Recent Labs  Lab 11/25/19 1426 11/25/19 1938 11/26/19 0505  PROCALCITON 4.86  --   --   LATICACIDVEN 2.5* 1.7 1.7    Recent Results (from the past 240 hour(s))  Culture, blood (Routine x 2)     Status: None   Collection Time: 11/24/19  3:14 PM   Specimen: BLOOD  Result Value Ref Range Status   Specimen Description BLOOD BLOOD RIGHT HAND  Final   Special Requests   Final    BOTTLES DRAWN AEROBIC AND ANAEROBIC Blood Culture results may not be optimal due to an inadequate volume of blood received in culture bottles   Culture   Final    NO GROWTH 5 DAYS Performed at [facility], [address] [place], [address] [place]    Report Status 11/29/2019 FINAL  Final  Culture, blood (Routine x 2)  Status: Abnormal   Collection Time: 11/24/19  5:20 PM   Specimen: BLOOD  Result Value Ref Range Status   Specimen Description   Final    BLOOD BLOOD RIGHT ARM Performed at [facility], [address] [place], [place] [address]    Special Requests   Final    BOTTLES DRAWN AEROBIC AND ANAEROBIC Blood Culture adequate volume Performed at [facility], [address] [place], [place] [address]    Culture  Setup Time   Final    Organism ID to follow GRAM POSITIVE COCCI IN BOTH AEROBIC AND ANAEROBIC BOTTLES CRITICAL RESULT CALLED TO, READ BACK BY AND VERIFIED WITH: SCOTT HALL 11/25/19 AT 0530 HS Performed at [facility], [address] [place], [place]  [id number]    Culture STREPTOCOCCUS GROUP G (A)  Final   Report Status 11/27/2019 FINAL  Final   Organism ID, Bacteria STREPTOCOCCUS GROUP G  Final      Susceptibility   Streptococcus group g - MIC*    CLINDAMYCIN RESISTANT Resistant     AMPICILLIN <=0.25 SENSITIVE Sensitive     ERYTHROMYCIN >=8 RESISTANT Resistant     VANCOMYCIN 0.5 SENSITIVE Sensitive     CEFTRIAXONE <=0.12 SENSITIVE Sensitive     LEVOFLOXACIN 0.5 SENSITIVE Sensitive     * STREPTOCOCCUS GROUP G  Blood Culture ID Panel (Reflexed)     Status: Abnormal   Collection Time: 11/24/19  5:20 PM  Result Value Ref Range Status   Enterococcus faecalis NOT DETECTED NOT DETECTED Final   Enterococcus Faecium NOT DETECTED NOT DETECTED Final   Listeria monocytogenes NOT DETECTED NOT DETECTED Final   Staphylococcus species NOT DETECTED NOT DETECTED Final   Staphylococcus aureus (BCID) NOT DETECTED NOT DETECTED Final   Staphylococcus epidermidis NOT DETECTED NOT DETECTED Final   Staphylococcus lugdunensis NOT DETECTED NOT DETECTED Final   Streptococcus species DETECTED (A) NOT DETECTED Final    Comment: Not Enterococcus species, Streptococcus agalactiae, Streptococcus pyogenes, or Streptococcus pneumoniae. CRITICAL RESULT CALLED TO, READ BACK BY AND VERIFIED WITH: SCOTT HALL 11/25/19 AT 0530 HS    Streptococcus agalactiae NOT DETECTED NOT DETECTED Final   Streptococcus pneumoniae NOT DETECTED NOT DETECTED Final   Streptococcus pyogenes NOT DETECTED NOT DETECTED Final   A.calcoaceticus-baumannii NOT DETECTED NOT DETECTED Final   Bacteroides fragilis NOT DETECTED NOT DETECTED Final   Enterobacterales NOT DETECTED NOT DETECTED Final   Enterobacter cloacae complex NOT DETECTED NOT DETECTED Final   Escherichia coli NOT DETECTED NOT DETECTED Final   Klebsiella aerogenes NOT DETECTED NOT DETECTED Final   Klebsiella oxytoca NOT DETECTED NOT DETECTED Final   Klebsiella pneumoniae NOT DETECTED NOT DETECTED Final   Proteus species NOT DETECTED  NOT DETECTED Final   Salmonella species NOT DETECTED NOT DETECTED Final   Serratia marcescens NOT DETECTED NOT DETECTED Final   Haemophilus influenzae NOT DETECTED NOT DETECTED Final   Neisseria meningitidis NOT DETECTED NOT DETECTED Final   Pseudomonas aeruginosa NOT DETECTED NOT DETECTED Final   Stenotrophomonas maltophilia NOT DETECTED NOT DETECTED Final   Candida albicans NOT DETECTED NOT DETECTED Final   Candida auris NOT DETECTED NOT DETECTED Final   Candida glabrata NOT DETECTED NOT DETECTED Final   Candida krusei NOT DETECTED NOT DETECTED Final   Candida parapsilosis NOT DETECTED NOT DETECTED Final   Candida tropicalis NOT DETECTED NOT DETECTED Final   Cryptococcus neoformans/gattii NOT DETECTED NOT DETECTED Final    Comment: Performed at [facility], [address] [place], [place] [address]  SARS Coronavirus 2 by RT PCR (hospital order, performed in [facility] hospital lab) Nasopharyngeal Nasopharyngeal Swab     Status: None   Collection Time: 11/24/19  7:40 PM   Specimen: Nasopharyngeal Swab  Result Value Ref Range Status   SARS Coronavirus 2 NEGATIVE NEGATIVE Final    Comment: (NOTE) SARS-CoV-2 target nucleic acids are NOT DETECTED.  The SARS-CoV-2 RNA is generally detectable in upper and lower respiratory specimens during the acute phase of infection. The lowest concentration of SARS-CoV-2 viral copies this assay can detect is 250 copies / mL. A negative result does not preclude SARS-CoV-2 infection and should not be used as the sole basis for treatment or other patient management decisions.  A negative result may occur with improper specimen collection / handling, submission of specimen other than nasopharyngeal swab, presence of viral mutation(s) within the areas targeted by this assay, and inadequate number of viral copies (<250 copies / mL). A negative result must be combined with clinical observations, patient history, and epidemiological  information.  Fact Sheet for Patients:   BoilerBrush.com.cy  Fact Sheet for Healthcare Providers: https://pope.com/  This test is not yet approved or  cleared by the [place] FDA and has been authorized for detection and/or diagnosis of SARS-CoV-2 by FDA under an Emergency Use Authorization (EUA).  This EUA will remain in effect (meaning this test can be used) for the duration of the COVID-19 declaration under Section 564(b)(1) of the Act, 21 U.S.C. section 360bbb-3(b)(1), unless the authorization is terminated or revoked sooner.  Performed at [facility], [address] [place], [place] [address]   C Difficile Quick Screen w PCR reflex     Status: None   Collection Time: 11/25/19 10:04 AM   Specimen: STOOL  Result Value Ref Range Status   C Diff antigen NEGATIVE NEGATIVE Final   C Diff toxin NEGATIVE NEGATIVE Final   C Diff interpretation No C. difficile detected.  Final    Comment: Performed at [facility], [address] [place], [place] [address]  MRSA PCR Screening     Status: None   Collection Time: 11/25/19  4:20 PM   Specimen: Nasopharyngeal  Result Value Ref Range Status   MRSA by PCR NEGATIVE NEGATIVE Final    Comment:        The GeneXpert MRSA Assay (FDA approved for NASAL specimens only), is one component of a comprehensive MRSA colonization surveillance program. It is not intended to diagnose MRSA infection nor to guide or monitor treatment for MRSA infections. Performed at [facility], [address], [place], [place] [address]   Aerobic Culture (superficial specimen)     Status: None (Preliminary result)   Collection Time: 11/27/19  4:50 PM   Specimen: Wound  Result Value Ref Range Status   Specimen Description   Final    WOUND Performed at [facility], [address], [place], [place] [address]    Special Requests   Final    LEFT LEG/SHIN Performed  at [facility], [address] [place], [place] [address]    Gram Stain   Final    RARE WBC PRESENT,BOTH PMN AND MONONUCLEAR MODERATE GRAM POSITIVE COCCI FEW GRAM NEGATIVE RODS    Culture   Final    MODERATE PSEUDOMONAS AERUGINOSA CULTURE REINCUBATED FOR BETTER GROWTH Performed at [facility], [address], [place], [place] [address]    Report Status PENDING  Incomplete   Organism ID, Bacteria PSEUDOMONAS AERUGINOSA  Final  Susceptibility   Pseudomonas aeruginosa - MIC*    CEFTAZIDIME >=64 RESISTANT Resistant     CIPROFLOXACIN <=0.25 SENSITIVE Sensitive     GENTAMICIN <=1 SENSITIVE Sensitive     IMIPENEM 2 SENSITIVE Sensitive     * MODERATE PSEUDOMONAS AERUGINOSA  Virus culture     Status: None   Collection Time: 11/27/19  4:50 PM   Specimen: Leg, Left; Wound  Result Value Ref Range Status   Viral Culture Comment  Final    Comment: (NOTE) Preliminary Report: No virus isolated at 24 hours. Next report to follow after 4 days. Performed At: [facility] [address] [place], [place] [id number] Jolene Schimke MD AO:[id number]    Source of Sample WD LEFT [facility]  Final    Comment: Performed at [facility], [address] [place], [place] [address]  Culture, blood (single) w Reflex to ID Panel     Status: None (Preliminary result)   Collection Time: 11/28/19  1:20 AM   Specimen: BLOOD  Result Value Ref Range Status   Specimen Description BLOOD RIGHT ANTECUBITAL  Final   Special Requests   Final    BOTTLES DRAWN AEROBIC AND ANAEROBIC Blood Culture results may not be optimal due to an inadequate volume of blood received in culture bottles   Culture   Final    NO GROWTH 3 DAYS Performed at [facility], [address] [place], [place] [address]    Report Status PENDING  Incomplete  CULTURE, BLOOD (ROUTINE X 2) w Reflex to ID Panel     Status: None (Preliminary result)   Collection Time: 12/01/19 12:49 AM   Specimen: BLOOD   Result Value Ref Range Status   Specimen Description BLOOD RFOA  Final   Special Requests BOTTLES DRAWN AEROBIC AND ANAEROBIC BCAV  Final   Culture   Final    NO GROWTH < 12 HOURS Performed at [facility], [address], [place], [place] [address]    Report Status PENDING  Incomplete  CULTURE, BLOOD (ROUTINE X 2) w Reflex to ID Panel     Status: None (Preliminary result)   Collection Time: 12/01/19  1:05 AM   Specimen: BLOOD  Result Value Ref Range Status   Specimen Description BLOOD LEFT HAND  Final   Special Requests BOTTLES DRAWN AEROBIC AND ANAEROBIC BCAV  Final   Culture   Final    NO GROWTH < 12 HOURS Performed at [facility], [address] [place], [place] [address]    Report Status PENDING  Incomplete      Imaging Studies   DG Knee 1-2 Views Left  Result Date: 11/30/2019 CLINICAL DATA:  Left knee pain.  Bilateral lower extremity edema. EXAM: LEFT KNEE - 1-2 VIEW COMPARISON:  Knee radiograph 12/03/2016 FINDINGS: Mild tricompartmental peripheral spurring. Spurring of the tibial spines. Joint spaces are preserved. 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 Mild soft tissue edema versus habitus. Lobulated soft tissue calcifications in the posterolateral calf partially included, may be phleboliths. IMPRESSION: 1. Mild tricompartmental osteoarthritis. No acute osseous abnormality. 2. Curvilinear projection from the medial femoral condyle is unchanged from prior exam, favor sequela of remote prior MCL injury over osteochondroma. Electronically Signed   By: Narda Rutherford M.D.   On: 11/30/2019 17:30     Medications   Scheduled Meds: . amiodarone  400 mg Oral BID  . Chlorhexidine Gluconate Cloth  6  each Topical Daily  . diclofenac Sodium  2 g Topical QID  . furosemide  80 mg Intravenous BID  . metoprolol tartrate  75 mg Oral BID  . potassium  chloride  40 mEq Oral Daily  . sodium chloride flush  10-40 mL Intracatheter Q12H   Continuous Infusions: . sodium chloride Stopped (11/28/19 0255)  . sodium chloride 250 mL (11/30/19 2044)  .  ceFAZolin (ANCEF) IV 2 g (12/02/19 0536)  . ciprofloxacin 400 mg (12/01/19 2328)  . clindamycin (CLEOCIN) IV 900 mg (12/02/19 0455)  . heparin    . sodium chloride         LOS: 8 days    Time spent: 25 minutes with > 50% spent in coordination of care and direct patient contact.    Pennie Banter, DO [facility]  12/02/2019, 8:11 AM    If 7PM-7AM, please contact night-coverage. How to contact the [facility] Attending or Consulting provider 7A - 7P or covering provider during after hours 7P -7A, for this patient?    1. Check the care team in [facility] and look for a) attending/consulting TR[facility] provider listed and b) the [facility] team listed 2. Log into [email or web address] and use [place]'s universal password to access. If you do not have the password, please contact the hospital operator. 3. Locate the [facility] provider you are looking for under [facility] and page to a number that you can be directly reached. 4. If you still have difficulty reaching the provider, please page the [facility] (Director on Call) for the Hospitalists listed on amion for assistance.

## 2019-12-02 NOTE — Progress Notes (Signed)
Progress Note  Patient Name: Alfred [name] Alfred [name] Date of Encounter: 12/02/2019  [facility] Cardiologist: [name], MD   Subjective   Reports abdomen is less distended, still not at baseline Left leg still very tender, swollen but slowly improving 4 L negative past 24 hours, already 1.3 L negative today, -13 L total Low-grade temperatures up to 100, asymptomatic   Inpatient Medications    Scheduled Meds: . amiodarone  400 mg Oral BID  . Chlorhexidine Gluconate Cloth  6 each Topical Daily  . diclofenac Sodium  2 g Topical QID  . furosemide  80 mg Intravenous BID  . metoprolol tartrate  75 mg Oral BID  . polyethylene glycol  17 g Oral Daily  . potassium chloride  40 mEq Oral Daily  . senna  2 tablet Oral QHS  . sodium chloride flush  10-40 mL Intracatheter Q12H   Continuous Infusions: . sodium chloride Stopped (11/28/19 0255)  . sodium chloride 250 mL (11/30/19 2044)  .  ceFAZolin (ANCEF) IV 2 g (12/02/19 0536)  . ciprofloxacin 400 mg (12/02/19 1044)  . clindamycin (CLEOCIN) IV 900 mg (12/02/19 0455)  . heparin 2,500 Units/hr (12/02/19 1043)  . sodium chloride     PRN Meds: sodium chloride, acetaminophen **OR** acetaminophen, albuterol, bisacodyl, guaiFENesin, HYDROcodone-acetaminophen, ipratropium-albuterol, melatonin, morphine injection, ondansetron **OR** ondansetron (ZOFRAN) IV, sodium chloride flush   Vital Signs    Vitals:   12/02/19 0539 12/02/19 0728 12/02/19 1045 12/02/19 1143  BP:  136/67 138/70 (!) 115/58  Pulse:  74 78 70  Resp:  18  18  Temp:  98.3 F (36.8 C)    TempSrc:  Oral    SpO2:  96%  95%  Weight: (!) 188.8 kg     Height:        Intake/Output Summary (Last 24 hours) at 12/02/2019 1335 Last data filed at 12/02/2019 1146 Gross per 24 hour  Intake 1316 ml  Output 5300 ml  Net -3984 ml   Last 3 Weights 12/02/2019 12/01/2019 11/30/2019  Weight (lbs) 416 lb 3.7 oz 420 lb 422 lb 6.4 oz  Weight (kg) 188.8 kg 190.511 kg 191.599 kg        Telemetry    Atrial fib rate in the 70s- Personally Reviewed  ECG     - Personally Reviewed  Physical Exam   Constitutional: Morbidly obese, oriented to person, place, and time. No distress.  HENT:  Head: Grossly normal Eyes:  no discharge. No scleral icterus.  Neck: No JVD, no carotid bruits  Cardiovascular: Regular rate and rhythm no murmurs appreciated 2+ pitting lower extremity edema to the thighs Pulmonary/Chest: Clear to auscultation bilaterally, Rales at the bases Abdominal: Soft.  Mild distension.  no tenderness.  Musculoskeletal: Normal range of motion Neurological:  normal muscle tone. Coordination normal. No atrophy Skin: Skin warm and dry Psychiatric: normal affect, pleasant  Labs    High Sensitivity Troponin:   Recent Labs  Lab 11/26/19 1811 11/26/19 2000  TROPONINIHS 27* 27*      Chemistry Recent Labs  Lab 11/29/19 0324 12/01/19 0059 12/02/19 0532  NA 138 136 134*  K 3.6 4.0 3.7  CL 100 96* 96*  CO2 26 29 33*  GLUCOSE 132* 117* 118*  BUN 14 14 13   CREATININE 0.92 0.96 0.82  CALCIUM 8.0* 7.8* 7.5*  GFRNONAA >60 >60 >60  GFRAA >60 >60 >60  ANIONGAP 12 11 5      Hematology Recent Labs  Lab 11/30/19 11/30/19 4098 12/01/19 0059 12/02/19 0532  WBC 14.7*  --  14.1* 12.3*  RBC 4.45   < > 4.09* 3.94*  3.76*  HGB 9.5*  --  8.9* 8.5*  HCT 30.5*  --  27.8* 26.8*  MCV 68.5*  --  68.0* 68.0*  MCH 21.3*  --  21.8* 21.6*  MCHC 31.1  --  32.0 31.7  RDW 16.3*  --  16.3* 16.1*  PLT 217  --  223 252   < > = values in this interval not displayed.    BNPNo results for input(s): BNP, PROBNP in the last 168 hours.   DDimer No results for input(s): DDIMER in the last 168 hours.   Radiology    DG Knee 1-2 Views Left  Result Date: 11/30/2019 CLINICAL DATA:  Left knee pain.  Bilateral lower extremity edema. EXAM: LEFT KNEE - 1-2 VIEW COMPARISON:  Knee radiograph 12/03/2016 FINDINGS: Mild tricompartmental peripheral spurring. Spurring of the  tibial spines. Joint spaces are preserved. 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 Mild soft tissue edema versus habitus. Lobulated soft tissue calcifications in the posterolateral calf partially included, may be phleboliths. IMPRESSION: 1. Mild tricompartmental osteoarthritis. No acute osseous abnormality. 2. Curvilinear projection from the medial femoral condyle is unchanged from prior exam, favor sequela of remote prior MCL injury over osteochondroma. Electronically Signed   By: Alfred [name] M.D.   On: 11/30/2019 17:30    Cardiac Studies   Echo  1. Left ventricular ejection fraction, by estimation, is 50 to 55%. The  left ventricle has low normal function. Left ventricular endocardial  border not optimally defined to evaluate regional wall motion. The left  ventricular internal cavity size was  mildly dilated. There is moderate left ventricular hypertrophy. Left  ventricular diastolic function could not be evaluated.  2. Right ventricular systolic function is normal. The right ventricular  size is normal. Tricuspid regurgitation signal is inadequate for assessing  PA pressure.  3. Left atrial size was mildly dilated.  4. The mitral valve is normal in structure. No evidence of mitral valve  regurgitation. No evidence of mitral stenosis.  5. The aortic valve is normal in structure. Aortic valve regurgitation is  not visualized. No aortic stenosis is present.    Patient Profile     54 y.o. male with history of morbid obesity, tobacco abuse, admitted due to edema found to have atrial flutter on 11/2019.  Spontaneously converted to sinus rhythm on 11/2022.  Assessment & Plan    Atrial flutter, typical Converted to normal sinus rhythm Continue amiodarone 400 twice daily 3 days then down to 200 twice daily Continue heparin infusion, changed to NOAC at discharge  Acute  on chronic diastolic CHF In the setting of arrhythmia, morbid obesity, suspected sleep apnea Still with massive edema in the legs on exam, pitting edema extending into the thighs and sacral area So far -13 L, would continue aggressive diuresis, will likely take additional 3-4 more days, close monitoring of renal function, decrease of Lasix for climbing numbers (so far stable)  Streptococcus group B bacteremia On broad-spectrum antibiotics, followed by ID  Left leg tenderness Notably more swollen than the right Despite lower extremity venous Doppler several days ago, unable to exclude DVT --Likely needs anticoagulation indefinitely given arrhythmia and high risk of DVT NOAC at discharge   Total encounter time more than 25 minutes  Greater than 50% was spent in counseling and coordination of care with the  patient   For questions or updates, please contact [facility] Please consult [email or web address] for contact info under       Signed, Julien Nordmann, MD  12/02/2019, 1:35 PM

## 2019-12-02 NOTE — Progress Notes (Signed)
ANTICOAGULATION CONSULT NOTE  Pharmacy Consult for heparin Indication: atrial flutter  No Known Allergies  Patient Measurements: Height: 6\' 7"  (200.7 cm) Weight: (!) 188.8 kg (416 lb 3.7 oz) IBW/kg (Calculated) : 93.7 Heparin Dosing Weight: 141 kg (heparin adjusted body weight), initially used 113 kg  Vital Signs: Temp: 98.3 F (36.8 C) (08/28 2317) Temp Source: Oral (08/28 2317) BP: 130/65 (08/28 2317) Pulse Rate: 68 (08/28 2317)  Labs: Recent Labs    11/30/19 0412 11/30/19 0412 12/01/19 0059 12/01/19 0059 12/02/19 0532 12/02/19 1507 12/02/19 2303  HGB 9.5*   < > 8.9*  --  8.5*  --   --   HCT 30.5*  --  27.8*  --  26.8*  --   --   PLT 217  --  223  --  252  --   --   HEPARINUNFRC 0.30   < > 0.36   < > 2.20* 0.23* 0.12*  CREATININE  --   --  0.96  --  0.82  --   --    < > = values in this interval not displayed.    Estimated Creatinine Clearance: 194.1 mL/min (by C-G formula based on SCr of 0.82 mg/dL).   Medical History: Past Medical History:  Diagnosis Date  . Lower extremity edema   . Morbid obesity (HCC) 11/27/2019  . Tobacco abuse 11/27/2019    Assessment: 54 year old patient with strep group G bacteremia. In atrial flutter with RVR on amiodarone and diltiazem. Plan for possible cardioversion. Pharmacy to start on heparin drip.  8/23 1710 HL < 0.1 8/24 1050 HL < 0.1  8/24 1955 HL 0.15  8/25 0330 HL 0.17 subtherapeutic 8/25 1352 HL < 0.10  Goal of Therapy:  Heparin level 0.3-0.7 units/ml Monitor platelets by anticoagulation protocol: Yes   Plan:  08/28 @ 2330 HL 0.12 subtherapeutic. Will increase rate to 2900 units/hr and will recheck HL at 0600 and continue to monitor. H/h low stable.  9/28, PharmD, BCPS Clinical Pharmacist 12/02/2019 11:56 PM

## 2019-12-02 NOTE — Progress Notes (Signed)
ANTICOAGULATION CONSULT NOTE  Pharmacy Consult for heparin Indication: atrial flutter  No Known Allergies  Patient Measurements: Height: 6\' 7"  (200.7 cm) Weight: (!) 188.8 kg (416 lb 3.7 oz) IBW/kg (Calculated) : 93.7 Heparin Dosing Weight: 141 kg (heparin adjusted body weight), initially used 113 kg  Vital Signs: Temp: 100 F (37.8 C) (08/28 0517) Temp Source: Oral (08/28 0517) BP: 126/52 (08/28 0517) Pulse Rate: 76 (08/28 0517)  Labs: Recent Labs    11/30/19 0412 11/30/19 0412 12/01/19 0059 12/02/19 0532  HGB 9.5*   < > 8.9* 8.5*  HCT 30.5*  --  27.8* 26.8*  PLT 217  --  223 252  HEPARINUNFRC 0.30  --  0.36 2.20*  CREATININE  --   --  0.96 0.82   < > = values in this interval not displayed.    Estimated Creatinine Clearance: 194.1 mL/min (by C-G formula based on SCr of 0.82 mg/dL).   Medical History: Past Medical History:  Diagnosis Date  . Lower extremity edema   . Morbid obesity (HCC) 11/27/2019  . Tobacco abuse 11/27/2019    Assessment: 54 year old patient with strep group G bacteremia. In atrial flutter with RVR on amiodarone and diltiazem. Plan for possible cardioversion. Pharmacy to start on heparin drip.  8/23 1710 HL < 0.1 8/24 1050 HL < 0.1  8/24 1955 HL 0.15  8/25 0330 HL 0.17 subtherapeutic 8/25 1352 HL < 0.10  Goal of Therapy:  Heparin level 0.3-0.7 units/ml Monitor platelets by anticoagulation protocol: Yes   Plan:  08/28 @ 0530 HL 2.20 supratherapeutic. Per RN patient not having any bleeding complications, heparin may be stacking d/t large volume of distribution. Will hold drip until 0845 and restart at a rate of 2500 units/hr and will recheck HL at 1500, CBC appears low stable will continue to monitor.  9/28, PharmD, BCPS Clinical Pharmacist 12/02/2019 6:47 AM

## 2019-12-02 NOTE — Progress Notes (Signed)
Heparin stopped at 0650 per the pharmacist recommendation and it will be resumed in 2 hours. No bleeding/hematoma or any new bruising noted. Will continue to monitor.

## 2019-12-02 NOTE — Progress Notes (Signed)
ANTICOAGULATION CONSULT NOTE  Pharmacy Consult for heparin Indication: atrial flutter  No Known Allergies  Patient Measurements: Height: 6\' 7"  (200.7 cm) Weight: (!) 188.8 kg (416 lb 3.7 oz) IBW/kg (Calculated) : 93.7 Heparin Dosing Weight: 138 kg  Vital Signs: Temp: 98.3 F (36.8 C) (08/28 0728) Temp Source: Oral (08/28 0728) BP: 115/58 (08/28 1143) Pulse Rate: 70 (08/28 1143)  Labs: Recent Labs    11/30/19 0412 11/30/19 0412 12/01/19 0059 12/02/19 0532 12/02/19 1507  HGB 9.5*   < > 8.9* 8.5*  --   HCT 30.5*  --  27.8* 26.8*  --   PLT 217  --  223 252  --   HEPARINUNFRC 0.30   < > 0.36 2.20* 0.23*  CREATININE  --   --  0.96 0.82  --    < > = values in this interval not displayed.    Estimated Creatinine Clearance: 194.1 mL/min (by C-G formula based on SCr of 0.82 mg/dL).   Medical History: Past Medical History:  Diagnosis Date  . Lower extremity edema   . Morbid obesity (HCC) 11/27/2019  . Tobacco abuse 11/27/2019    Assessment: 54 year old patient with strep group G bacteremia. In atrial flutter with RVR on amiodarone and diltiazem. Plan for possible cardioversion. Pharmacy to start on heparin drip.  8/23 1710 HL < 0.1 8/24 1050 HL < 0.1  8/24 1955 HL 0.15  8/25 0330 HL 0.17 subtherapeutic 8/25 1352 HL < 0.10 8/25 2210 HL 0.30 8/26 0412 HL 0.30 8/27 0059 HL 0.36 8/28 0532 HL 2.20 8/28 1507 HL 0.23  Goal of Therapy:  Heparin level 0.3-0.7 units/ml Monitor platelets by anticoagulation protocol: Yes   Plan:  08/28 @ 1507 HL 0.23 subtherapeutic. Will give heparin 2000 unit bolus and increase rate to 2800 units/hr. Recheck HL at 2300. CBC appears stable - will continue to monitor.  9/28, PharmD Pharmacy Resident  12/02/2019 4:09 PM

## 2019-12-03 DIAGNOSIS — Z720 Tobacco use: Secondary | ICD-10-CM

## 2019-12-03 DIAGNOSIS — R0602 Shortness of breath: Secondary | ICD-10-CM

## 2019-12-03 LAB — BASIC METABOLIC PANEL
Anion gap: 11 (ref 5–15)
BUN: 14 mg/dL (ref 6–20)
CO2: 27 mmol/L (ref 22–32)
Calcium: 8.1 mg/dL — ABNORMAL LOW (ref 8.9–10.3)
Chloride: 94 mmol/L — ABNORMAL LOW (ref 98–111)
Creatinine, Ser: 0.9 mg/dL (ref 0.61–1.24)
GFR calc Af Amer: >60 mL/min (ref >60)
GFR calc non Af Amer: >60 mL/min (ref >60)
Glucose, Bld: 118 mg/dL — ABNORMAL HIGH (ref 70–99)
Potassium: 3.8 mmol/L (ref 3.5–5.1)
Sodium: 132 mmol/L — ABNORMAL LOW (ref 135–145)

## 2019-12-03 LAB — CBC WITH DIFFERENTIAL/PLATELET
Abs Immature Granulocytes: 0.45 10*3/uL — ABNORMAL HIGH (ref 0.00–0.07)
Basophils Absolute: 0 10*3/uL (ref 0.0–0.1)
Basophils Relative: 0 %
Eosinophils Absolute: 0.1 10*3/uL (ref 0.0–0.5)
Eosinophils Relative: 1 %
HCT: 29 % — ABNORMAL LOW (ref 39.0–52.0)
Hemoglobin: 9.1 g/dL — ABNORMAL LOW (ref 13.0–17.0)
Immature Granulocytes: 4 %
Lymphocytes Relative: 8 %
Lymphs Abs: 0.9 10*3/uL (ref 0.7–4.0)
MCH: 21.5 pg — ABNORMAL LOW (ref 26.0–34.0)
MCHC: 31.4 g/dL (ref 30.0–36.0)
MCV: 68.4 fL — ABNORMAL LOW (ref 80.0–100.0)
Monocytes Absolute: 0.9 10*3/uL (ref 0.1–1.0)
Monocytes Relative: 8 %
Neutro Abs: 9 10*3/uL — ABNORMAL HIGH (ref 1.7–7.7)
Neutrophils Relative %: 79 %
Platelets: 245 10*3/uL (ref 150–400)
RBC: 4.24 MIL/uL (ref 4.22–5.81)
RDW: 16.5 % — ABNORMAL HIGH (ref 11.5–15.5)
Smear Review: NORMAL
WBC: 11.4 10*3/uL — ABNORMAL HIGH (ref 4.0–10.5)
nRBC: 0 % (ref 0.0–0.2)

## 2019-12-03 LAB — CULTURE, BLOOD (SINGLE): Culture: NO GROWTH

## 2019-12-03 LAB — MAGNESIUM: Magnesium: 2.1 mg/dL (ref 1.7–2.4)

## 2019-12-03 LAB — HEPARIN LEVEL (UNFRACTIONATED)
Heparin Unfractionated: 0.33 IU/mL (ref 0.30–0.70)
Heparin Unfractionated: 0.38 IU/mL (ref 0.30–0.70)

## 2019-12-03 NOTE — Progress Notes (Signed)
Progress Note  Patient Name: Alfred [name] Date of Encounter: 12/03/2019  [facility] Cardiologist: [name], MD   Subjective   Continues to lay supine in bed watching TV Reports he did not get out of bed yesterday Left leg pain dramatically improved, still mildly tender    Inpatient Medications    Scheduled Meds: . amiodarone  400 mg Oral BID  . Chlorhexidine Gluconate Cloth  6 each Topical Daily  . diclofenac Sodium  2 g Topical QID  . furosemide  80 mg Intravenous BID  . metoprolol tartrate  75 mg Oral BID  . polyethylene glycol  17 g Oral Daily  . potassium chloride  40 mEq Oral Daily  . senna  2 tablet Oral QHS  . sodium chloride flush  10-40 mL Intracatheter Q12H   Continuous Infusions: . sodium chloride Stopped (11/28/19 0255)  . sodium chloride 250 mL (11/30/19 2044)  .  ceFAZolin (ANCEF) IV 2 g (12/03/19 0510)  . ciprofloxacin 400 mg (12/03/19 1037)  . clindamycin (CLEOCIN) IV 900 mg (12/03/19 0509)  . heparin 2,900 Units/hr (12/03/19 0514)  . sodium chloride     PRN Meds: sodium chloride, acetaminophen **OR** acetaminophen, albuterol, bisacodyl, guaiFENesin, HYDROcodone-acetaminophen, ipratropium-albuterol, melatonin, morphine injection, ondansetron **OR** ondansetron (ZOFRAN) IV, sodium chloride flush   Vital Signs    Vitals:   12/02/19 1712 12/02/19 2317 12/03/19 0448 12/03/19 0900  BP: 123/61 130/65 (!) 107/46 123/65  Pulse: 75 68 68 70  Resp: 18 19 16    Temp: 97.9 F (36.6 C) 98.3 F (36.8 C) 99.1 F (37.3 C) 98.4 F (36.9 C)  TempSrc:  Oral Oral Oral  SpO2: 97% 95% 95% 97%  Weight:   (!) 188.2 kg   Height:        Intake/Output Summary (Last 24 hours) at 12/03/2019 1224 Last data filed at 12/03/2019 1153 Gross per 24 hour  Intake 2136.66 ml  Output 2850 ml  Net -713.34 ml   Last 3 Weights 12/03/2019 12/02/2019 12/01/2019  Weight (lbs) 414 lb 14.5 oz 416 lb 3.7 oz 420 lb  Weight (kg) 188.2 kg 188.8 kg 190.511 kg       Telemetry    Normal sinus rhythm- Personally Reviewed  ECG     - Personally Reviewed  Physical Exam   Constitutional:  oriented to person, place, and time. No distress. Morbidly obese HENT:  Head: Normocephalic and atraumatic.  Eyes:  no discharge. No scleral icterus.  Neck: Normal range of motion. Neck supple. No JVD present.  Cardiovascular: Normal rate, regular rhythm, normal heart sounds and intact distal pulses. Exam reveals no gallop and no friction rub.  1+ pitting edema left lower extremity, wrap in place bilaterally No murmur heard. Pulmonary/Chest: Effort normal and breath sounds normal. No stridor. No respiratory distress.  no wheezes.  no rales.  no tenderness.  Abdominal: Soft.  no distension.  no tenderness.  Musculoskeletal: Normal range of motion.  no  tenderness or deformity.  Neurological:  normal muscle tone. Coordination normal. No atrophy Skin: Skin is warm and dry. No rash noted. not diaphoretic.  Psychiatric:  normal mood and affect. behavior is normal. Thought content normal.    Labs    High Sensitivity Troponin:   Recent Labs  Lab 11/26/19 1811 11/26/19 2000  TROPONINIHS 27* 27*      Chemistry Recent Labs  Lab 12/01/19 0059 12/02/19 0532 12/03/19 0614  NA 136 134* 132*  K 4.0 3.7 3.8  CL 96* 96* 94*  CO2 29  33* 27  GLUCOSE 117* 118* 118*  BUN 14 13 14   CREATININE 0.96 0.82 0.90  CALCIUM 7.8* 7.5* 8.1*  GFRNONAA >60 >60 >60  GFRAA >60 >60 >60  ANIONGAP 11 5 11      Hematology Recent Labs  Lab 12/01/19 0059 12/02/19 0532 12/03/19 0614  WBC 14.1* 12.3* 11.4*  RBC 4.09* 3.94*  3.76* 4.24  HGB 8.9* 8.5* 9.1*  HCT 27.8* 26.8* 29.0*  MCV 68.0* 68.0* 68.4*  MCH 21.8* 21.6* 21.5*  MCHC 32.0 31.7 31.4  RDW 16.3* 16.1* 16.5*  PLT 223 252 245    BNPNo results for input(s): BNP, PROBNP in the last 168 hours.   DDimer No results for input(s): DDIMER in the last 168 hours.   Radiology    No results found.  Cardiac Studies    Echo  1. Left ventricular ejection fraction, by estimation, is 50 to 55%. The  left ventricle has low normal function. Left ventricular endocardial  border not optimally defined to evaluate regional wall motion. The left  ventricular internal cavity size was  mildly dilated. There is moderate left ventricular hypertrophy. Left  ventricular diastolic function could not be evaluated.  2. Right ventricular systolic function is normal. The right ventricular  size is normal. Tricuspid regurgitation signal is inadequate for assessing  PA pressure.  3. Left atrial size was mildly dilated.  4. The mitral valve is normal in structure. No evidence of mitral valve  regurgitation. No evidence of mitral stenosis.  5. The aortic valve is normal in structure. Aortic valve regurgitation is  not visualized. No aortic stenosis is present.    Patient Profile     54 y.o. male with history of morbid obesity, tobacco abuse, admitted due to edema found to have atrial flutter on 11/2019.  Spontaneously converted to sinus rhythm on 11/2022.  Assessment & Plan    Atrial flutter, typical Converted to normal sinus rhythm August 24, Continue amiodarone 400 twice daily 2 days then down to 200 twice daily Heparin infusion with transition to NOAC at discharge Discussed with hospitalist service, he has no insurance, will need to be set up to medical management If unable to qualify for NOAC he will need warfarin  Acute on chronic diastolic CHF In the setting of arrhythmia, morbid obesity, suspected sleep apnea Presenting with massive edema, 14 L off so far -Given stable renal function, continue leg edema, would continue IV diuresis  Streptococcus group B bacteremia On broad-spectrum antibiotics, followed by ID Plan for transesophageal echo once euvolemic Currently unable to move in bed, has significant left leg pain, would be unable to lay on his left side  Left leg tenderness Unable to rule out DVT,  ultrasound was challenging Continue anticoagulation indefinitely  Case discussed with nursing in detail and with hospitalist service Stressed importance of him getting out of bed given high risk of decubitus ulcers, skin breakdown already noted on posterior thighs  Total encounter time more than 35 minutes  Greater than 50% was spent in counseling and coordination of care with the patient   For questions or updates, please contact [facility] Please consult [email or web address] for contact info under       Signed, 12/2022, MD  12/03/2019, 12:24 PM

## 2019-12-03 NOTE — Progress Notes (Signed)
ANTICOAGULATION CONSULT NOTE  Pharmacy Consult for heparin Indication: atrial flutter  No Known Allergies  Patient Measurements: Height: 6\' 7"  (200.7 cm) Weight: (!) 188.2 kg (414 lb 14.5 oz) IBW/kg (Calculated) : 93.7 Heparin Dosing Weight: 141 kg (heparin adjusted body weight), initially used 113 kg  Vital Signs: Temp: 98.4 F (36.9 C) (08/29 0900) Temp Source: Oral (08/29 0900) BP: 123/65 (08/29 0900) Pulse Rate: 70 (08/29 0900)  Labs: Recent Labs    12/01/19 0059 12/01/19 0059 12/02/19 0532 12/02/19 1507 12/02/19 2303 12/03/19 0614 12/03/19 1227  HGB 8.9*   < > 8.5*  --   --  9.1*  --   HCT 27.8*  --  26.8*  --   --  29.0*  --   PLT 223  --  252  --   --  245  --   HEPARINUNFRC 0.36   < > 2.20*   < > 0.12* 0.33 0.38  CREATININE 0.96  --  0.82  --   --  0.90  --    < > = values in this interval not displayed.    Estimated Creatinine Clearance: 176.6 mL/min (by C-G formula based on SCr of 0.9 mg/dL).   Medical History: Past Medical History:  Diagnosis Date  . Lower extremity edema   . Morbid obesity (HCC) 11/27/2019  . Tobacco abuse 11/27/2019    Assessment: 54 year old patient with strep group G bacteremia. In atrial flutter with RVR on amiodarone and diltiazem. Plan for possible cardioversion. Pharmacy to start on heparin drip.  8/28 2303 HL 0.12 - rate increased to 2900 units/hr.  8/29 0614 HL 0.33  8/29 1227 HL 0.38   Goal of Therapy:  Heparin level 0.3-0.7 units/ml Monitor platelets by anticoagulation protocol: Yes   Plan:  Heparin level is therapeutic. Will continue heparin rate at 2900 units/hr and will recheck HL and CBC with AM labs.     9/29, PharmD, BCPS Clinical Pharmacist 12/03/2019 1:40 PM

## 2019-12-03 NOTE — Progress Notes (Signed)
ANTICOAGULATION CONSULT NOTE  Pharmacy Consult for heparin Indication: atrial flutter  No Known Allergies  Patient Measurements: Height: 6\' 7"  (200.7 cm) Weight: (!) 188.2 kg (414 lb 14.5 oz) IBW/kg (Calculated) : 93.7 Heparin Dosing Weight: 141 kg (heparin adjusted body weight), initially used 113 kg  Vital Signs: Temp: 99.1 F (37.3 C) (08/29 0448) Temp Source: Oral (08/29 0448) BP: 107/46 (08/29 0448) Pulse Rate: 68 (08/29 0448)  Labs: Recent Labs    12/01/19 0059 12/01/19 0059 12/02/19 0532 12/02/19 0532 12/02/19 1507 12/02/19 2303 12/03/19 0614  HGB 8.9*  --  8.5*  --   --   --   --   HCT 27.8*  --  26.8*  --   --   --   --   PLT 223  --  252  --   --   --   --   HEPARINUNFRC 0.36   < > 2.20*   < > 0.23* 0.12* 0.33  CREATININE 0.96  --  0.82  --   --   --   --    < > = values in this interval not displayed.    Estimated Creatinine Clearance: 193.8 mL/min (by C-G formula based on SCr of 0.82 mg/dL).   Medical History: Past Medical History:  Diagnosis Date  . Lower extremity edema   . Morbid obesity (HCC) 11/27/2019  . Tobacco abuse 11/27/2019    Assessment: 54 year old patient with strep group G bacteremia. In atrial flutter with RVR on amiodarone and diltiazem. Plan for possible cardioversion. Pharmacy to start on heparin drip.  8/28 2303 HL 0.12 - rate increased to 2900 units/hr.  8/29 0614 HL 0.33   Goal of Therapy:  Heparin level 0.3-0.7 units/ml Monitor platelets by anticoagulation protocol: Yes   Plan:  Heparin level is therapeutic. Will continue heparin rate at 2900 units/hr and will recheck HL in 6 hours. CBC daily while on heparin.    9/29, PharmD, BCPS Clinical Pharmacist 12/03/2019 7:29 AM

## 2019-12-03 NOTE — Progress Notes (Signed)
PROGRESS NOTE    Alfred [name]   ZOX:[id number]  DOB: 03/03/1966  PCP: Patient, No Pcp Per    DOA: 11/24/2019 LOS: 9   Brief Narrative   Alfred [name] is a 53 y.o. male with history of chronic lower extremity venous stasis for the past 15 years but otherwise no medical history and on no medications who presented to the ED on 11/24/19 with 2-week history of progressively worsening bilateral lower extremity swelling, left greater than right associated with pain and skin breakdown with oozing. Reported subjective fevers.  In the ED, low-grade temperature of 99, tachycardic at 122 bpm, with normal blood pressure.  WBC normal at 9100, lactic acid borderline at 1.9.  Blood work showing creatinine of 1.27 above baseline of 1.16 but with prior baseline around 0.7.  Urinalysis showing proteinuria.  Chest x-ray showed no active disease.  Bilateral lower extremity Doppler negative for DVT bilaterally, but was suboptimal study due to body habitus and edema.    Admitted to hospitalist service.  Blood cultures positive for group G strep.  Being treated with IV antibiotics for cellulitis and bacteremia.       Assessment & Plan   Principal Problem:   Streptococcal sepsis, unspecified (HCC) Active Problems:   Cellulitis of left leg   AKI (acute kidney injury) (HCC)   Bacteremia   Lower extremity edema   Morbid obesity (HCC)   Tobacco abuse   Paroxysmal atrial fibrillation (HCC)   Acute on chronic heart failure with preserved ejection fraction (HFpEF) (HCC)   Atrial flutter (HCC)   Acute on chronic HFpEF - presented fluid overloaded, massive edema of BLE's up to proximal thighs and abdomen.  Slowly improving with IV diuresis.  Cardiology following.  Continue Lasix 80 mg IV BID.  Monitor I/O's, daily weights.  Low sodium diet.  Will require several days aggressive IV diuresis.  Net IO Since Admission: -12,727.8 mL [12/03/19 0814]   Intake/Output Summary (Last 24 hours) at  12/02/2019 1541 Last data filed at 12/02/2019 1345 Gross per 24 hour  Intake 1316 ml  Output 4200 ml  Net -2884 ml    Sepsis secondary to group G Strep bacteremia - sepsis POA as evidenced by fever, tachycardia, and known source(s) of infection.  ID is following.  Continue Unasyn (initially was on Vanc Rocephin).  Monitor CBC.  Will need TEE to rule out endocarditis (cardiology wants further diuresis beforehand).  Follow up repeat blood cultures.       Cellulitis of left leg - POA, due to severe venous stasis.  Antibiotic as above.  Wound care.  PRN analgesics.  Generalized weakness - patient has not been out of bed or attempting any mobility.  Prior to admission was ambulatory.  PT recommends SNF, but patient is uninsured.  Out of bed to chair at least BID.  Sit up edge of bed.  Encourage as much mobility as possible.  Reposition every 2 hours.    AKI (acute kidney injury) - POA, resolved.  Monitor BMP.  Lower extremity edema - chronic venous stasis.  Wound care.  Monitor.  PT recommends SNF. Venous dopplers of lower extremities on admission were unable to exclude DVT, suboptimal study due to body habitus.  Atrial flutter - Currently sinus rhythm.  CHA2DS2-VASc of 1.  Cardiology following.  Continue heparin gtt, transition to DOAC on discharge.  Continue PO Amiodarone.  Continue Lopressor 50 mg PO BID.    Microcytic anemia - mild on admission, progressive.  Hbg trend from admission  forward: 12.9>>10.9>>10.2>>...8.9 today.   Anemia panel with AM labs.  Check FOBT.  Monitor CBC.  Hepatitis C - will need GI follow up outpatient to discuss treatment.  Viral load > 2.23million.   Morbid obesity - Body mass index is 46.74 kg/m. Complicates overall care and prognosis.  Tobacco abuse - counseled on cessation.    DVT prophylaxis: on heparin gtt    Diet:  Diet Orders (From admission, onward)    Start     Ordered   11/28/19 0756  Diet heart healthy/carb modified Room service appropriate?  Yes; Fluid consistency: Thin  Diet effective now       Question Answer Comment  Diet-HS Snack? Nothing   Room service appropriate? Yes   Fluid consistency: Thin      11/28/19 0755            Code Status: Full Code    Subjective 12/03/19    Patient seen this AM at bedside.  Says left leg still numb/painful with swelling, right is better.  No acute complaints.  Disposition Plan & Communication   Status is: Inpatient  Remains inpatient appropriate because:IV treatments appropriate due to intensity of illness or inability to take PO.   Ongoing IV diuresis and IV antibiotics for bacteremia.   Dispo: The patient is from: Home              Anticipated d/c is to: Home              Anticipated d/c date is: 3 days              Patient currently is not medically stable to d/c.    Family Communication: none at bedside, will attempt to call   Consults, Procedures, Significant Events   Consultants:   Cardiology  Infectious disease  Wound care RN  Procedures:   None  Antimicrobials:   Unasyn    Objective   Vitals:   12/02/19 1143 12/02/19 1712 12/02/19 2317 12/03/19 0448  BP: (!) 115/58 123/61 130/65 (!) 107/46  Pulse: 70 75 68 68  Resp: Temp:  97.9 F (36.6 C) 98.3 F (36.8 C) 99.1 F (37.3 C)  TempSrc:   Oral Oral  SpO2: 95% 97% 95% 95%  Weight:    (!) 188.2 kg  Height:        Intake/Output Summary (Last 24 hours) at 12/03/2019 0814 Last data filed at 12/03/2019 0510 Gross per 24 hour  Intake 2136.66 ml  Output 3025 ml  Net -888.34 ml   Filed Weights   12/01/19 0357 12/02/19 0539 12/03/19 0448  Weight: (!) 190.5 kg (!) 188.8 kg (!) 188.2 kg    Physical Exam:  General exam: awake, alert, no acute distress, obese Respiratory system: CTAB but diminished due to body habitus, normal respiratory effort. On room air. Cardiovascular system: normal S1/S2, RRR, RLE edema nearly resolved trace at most, LLE edema persistent. Central nervous  system: A&O x3. no gross focal neurologic deficits, normal speech Extremities: b/l LE's wrapped and offloading boots in place.  Onychomycosis. Psychiatry: normal mood, congruent affect  Labs   Data Reviewed: I have personally reviewed following labs and imaging studies  CBC: Recent Labs  Lab 11/26/19 0840 11/26/19 0840 11/27/19 0426 11/28/19 0108 11/29/19 0324 11/30/19 0412 12/01/19 0059 12/02/19 0532 12/03/19 0614  WBC 8.6   < > 8.9   < > 14.4* 14.7* 14.1* 12.3* 11.4*  NEUTROABS 7.3  --  7.4  --   --  11.4*  --  9.8* 9.0*  HGB 10.2*   < > 9.8*   < > 9.6* 9.5* 8.9* 8.5* 9.1*  HCT 33.1*   < > 32.6*   < > 29.8* 30.5* 27.8* 26.8* 29.0*  MCV 68.8*   < > 70.9*   < > 68.0* 68.5* 68.0* 68.0* 68.4*  PLT 142*   < > 157   < > 228 217 223 252 245   < > = values in this interval not displayed.   Basic Metabolic Panel: Recent Labs  Lab 11/26/19 1512 11/27/19 0426 11/28/19 0108 11/29/19 0324 12/01/19 0059 12/02/19 0532 12/03/19 0614  NA  --    < > 138 138 136 134* 132*  K  --    < > 3.6 3.6 4.0 3.7 3.8  CL  --    < > 103 100 96* 96* 94*  CO2  --    < > 25 26 29  33* 27  GLUCOSE  --    < > 141* 132* 117* 118* 118*  BUN  --    < > 14 14 14 13 14   CREATININE  --    < > 0.76 0.92 0.96 0.82 0.90  CALCIUM  --    < > 8.0* 8.0* 7.8* 7.5* 8.1*  MG 2.5*  --   --   --  1.9  --  2.1  PHOS 2.3*  --   --   --   --   --   --    < > = values in this interval not displayed.   GFR: Estimated Creatinine Clearance: 176.6 mL/min (by C-G formula based on SCr of 0.9 mg/dL). Liver Function Tests: No results for input(s): AST, ALT, ALKPHOS, BILITOT, PROT, ALBUMIN in the last 168 hours. No results for input(s): LIPASE, AMYLASE in the last 168 hours. No results for input(s): AMMONIA in the last 168 hours. Coagulation Profile: No results for input(s): INR, PROTIME in the last 168 hours. Cardiac Enzymes: No results for input(s): CKTOTAL, CKMB, CKMBINDEX, TROPONINI in the last 168 hours. BNP (last 3  results) No results for input(s): PROBNP in the last 8760 hours. HbA1C: No results for input(s): HGBA1C in the last 72 hours. CBG: Recent Labs  Lab 11/26/19 1655  GLUCAP 103*   Lipid Profile: No results for input(s): CHOL, HDL, LDLCALC, TRIG, CHOLHDL, LDLDIRECT in the last 72 hours. Thyroid Function Tests: No results for input(s): TSH, T4TOTAL, FREET4, T3FREE, THYROIDAB in the last 72 hours. Anemia Panel: Recent Labs    12/02/19 0527 12/02/19 0532  VITAMINB12 681  --   FOLATE  --  13.9  FERRITIN  --  665*  TIBC  --  245*  IRON  --  24*  RETICCTPCT  --  1.2   Sepsis Labs: No results for input(s): PROCALCITON, LATICACIDVEN in the last 168 hours.  Recent Results (from the past 240 hour(s))  Culture, blood (Routine x 2)     Status: None   Collection Time: 11/24/19  3:14 PM   Specimen: BLOOD  Result Value Ref Range Status   Specimen Description BLOOD BLOOD RIGHT HAND  Final   Special Requests   Final    BOTTLES DRAWN AEROBIC AND ANAEROBIC Blood Culture results may not be optimal due to an inadequate volume of blood received in culture bottles   Culture   Final    NO GROWTH 5 DAYS Performed at [facility], [address], [place], [address] [place]    Report Status 11/29/2019 FINAL  Final  Culture, blood (Routine x 2)     Status: Abnormal   Collection Time: 11/24/19  5:20 PM   Specimen: BLOOD  Result Value Ref Range Status   Specimen Description   Final    BLOOD BLOOD RIGHT ARM Performed at [facility], [address] [place], [place] [address]    Special Requests   Final    BOTTLES DRAWN AEROBIC AND ANAEROBIC Blood Culture adequate volume Performed at [facility], [address] [place], [place] [address]    Culture  Setup Time   Final    Organism ID to follow GRAM POSITIVE COCCI IN BOTH AEROBIC AND ANAEROBIC BOTTLES CRITICAL RESULT CALLED TO, READ BACK BY AND VERIFIED WITH: SCOTT HALL 11/25/19 AT 0530 HS Performed at [facility], [address] [place], [place] [address]    Culture STREPTOCOCCUS GROUP G (A)  Final   Report Status 11/27/2019 FINAL  Final   Organism ID, Bacteria STREPTOCOCCUS GROUP G  Final      Susceptibility   Streptococcus group g - MIC*    CLINDAMYCIN RESISTANT Resistant     AMPICILLIN <=0.25 SENSITIVE Sensitive     ERYTHROMYCIN >=8 RESISTANT Resistant     VANCOMYCIN 0.5 SENSITIVE Sensitive     CEFTRIAXONE <=0.12 SENSITIVE Sensitive     LEVOFLOXACIN 0.5 SENSITIVE Sensitive     * STREPTOCOCCUS GROUP G  Blood Culture ID Panel (Reflexed)     Status: Abnormal   Collection Time: 11/24/19  5:20 PM  Result Value Ref Range Status   Enterococcus faecalis NOT DETECTED NOT DETECTED Final   Enterococcus Faecium NOT DETECTED NOT DETECTED Final   Listeria monocytogenes NOT DETECTED NOT DETECTED Final   Staphylococcus species NOT DETECTED NOT DETECTED Final   Staphylococcus aureus (BCID) NOT DETECTED NOT DETECTED Final   Staphylococcus epidermidis NOT DETECTED NOT DETECTED Final   Staphylococcus lugdunensis NOT DETECTED NOT DETECTED Final   Streptococcus species DETECTED (A) NOT DETECTED Final    Comment: Not Enterococcus species, Streptococcus agalactiae, Streptococcus pyogenes, or Streptococcus pneumoniae. CRITICAL RESULT CALLED TO, READ BACK BY AND VERIFIED WITH: SCOTT HALL 11/25/19 AT 0530 HS    Streptococcus agalactiae NOT DETECTED NOT DETECTED Final   Streptococcus pneumoniae NOT DETECTED NOT DETECTED Final   Streptococcus pyogenes NOT DETECTED NOT DETECTED Final   A.calcoaceticus-baumannii NOT DETECTED NOT DETECTED Final   Bacteroides fragilis NOT DETECTED NOT DETECTED Final   Enterobacterales NOT DETECTED NOT DETECTED Final   Enterobacter cloacae complex NOT DETECTED NOT DETECTED Final   Escherichia coli NOT DETECTED NOT DETECTED Final   Klebsiella aerogenes NOT DETECTED NOT DETECTED Final   Klebsiella oxytoca NOT DETECTED NOT DETECTED Final   Klebsiella pneumoniae NOT  DETECTED NOT DETECTED Final   Proteus species NOT DETECTED NOT DETECTED Final   Salmonella species NOT DETECTED NOT DETECTED Final   Serratia marcescens NOT DETECTED NOT DETECTED Final   Haemophilus influenzae NOT DETECTED NOT DETECTED Final   Neisseria meningitidis NOT DETECTED NOT DETECTED Final   Pseudomonas aeruginosa NOT DETECTED NOT DETECTED Final   Stenotrophomonas maltophilia NOT DETECTED NOT DETECTED Final   Candida albicans NOT DETECTED NOT DETECTED Final   Candida auris NOT DETECTED NOT DETECTED Final   Candida glabrata NOT DETECTED NOT DETECTED Final   Candida krusei NOT DETECTED NOT DETECTED Final   Candida parapsilosis NOT DETECTED NOT DETECTED Final   Candida tropicalis NOT DETECTED NOT DETECTED Final   Cryptococcus neoformans/gattii NOT DETECTED NOT DETECTED Final    Comment: Performed at  [facility], [address] [place], [place] [address]  SARS Coronavirus 2 by RT PCR (hospital order, performed in [facility] hospital lab) Nasopharyngeal Nasopharyngeal Swab     Status: None   Collection Time: 11/24/19  7:40 PM   Specimen: Nasopharyngeal Swab  Result Value Ref Range Status   SARS Coronavirus 2 NEGATIVE NEGATIVE Final    Comment: (NOTE) SARS-CoV-2 target nucleic acids are NOT DETECTED.  The SARS-CoV-2 RNA is generally detectable in upper and lower respiratory specimens during the acute phase of infection. The lowest concentration of SARS-CoV-2 viral copies this assay can detect is 250 copies / mL. A negative result does not preclude SARS-CoV-2 infection and should not be used as the sole basis for treatment or other patient management decisions.  A negative result may occur with improper specimen collection / handling, submission of specimen other than nasopharyngeal swab, presence of viral mutation(s) within the areas targeted by this assay, and inadequate number of viral copies (<250 copies / mL). A negative result must be combined with  clinical observations, patient history, and epidemiological information.  Fact Sheet for Patients:   BoilerBrush.com.cy  Fact Sheet for Healthcare Providers: https://pope.com/  This test is not yet approved or  cleared by the [place] FDA and has been authorized for detection and/or diagnosis of SARS-CoV-2 by FDA under an Emergency Use Authorization (EUA).  This EUA will remain in effect (meaning this test can be used) for the duration of the COVID-19 declaration under Section 564(b)(1) of the Act, 21 U.S.C. section 360bbb-3(b)(1), unless the authorization is terminated or revoked sooner.  Performed at [facility], [address] [place], [place] [address]   C Difficile Quick Screen w PCR reflex     Status: None   Collection Time: 11/25/19 10:04 AM   Specimen: STOOL  Result Value Ref Range Status   C Diff antigen NEGATIVE NEGATIVE Final   C Diff toxin NEGATIVE NEGATIVE Final   C Diff interpretation No C. difficile detected.  Final    Comment: Performed at [facility], [address] [place], [place] [address]  MRSA PCR Screening     Status: None   Collection Time: 11/25/19  4:20 PM   Specimen: Nasopharyngeal  Result Value Ref Range Status   MRSA by PCR NEGATIVE NEGATIVE Final    Comment:        The GeneXpert MRSA Assay (FDA approved for NASAL specimens only), is one component of a comprehensive MRSA colonization surveillance program. It is not intended to diagnose MRSA infection nor to guide or monitor treatment for MRSA infections. Performed at [facility], [address], [place], [place] [address]   Aerobic Culture (superficial specimen)     Status: None   Collection Time: 11/27/19  4:50 PM   Specimen: Wound  Result Value Ref Range Status   Specimen Description   Final    WOUND Performed at [facility], [address], [place], [place] [address]    Special  Requests   Final    LEFT LEG/SHIN Performed at [facility], [address] [place], [place] [address]    Gram Stain   Final    RARE WBC PRESENT,BOTH PMN AND MONONUCLEAR MODERATE GRAM POSITIVE COCCI FEW GRAM NEGATIVE RODS    Culture   Final    MODERATE PSEUDOMONAS AERUGINOSA FEW STREPTOCOCCUS GROUP G Beta hemolytic streptococci are predictably susceptible to penicillin and other beta lactams. Susceptibility testing not routinely performed. Performed at [facility], [address] [place], [place] [address]    Report Status 12/02/2019 FINAL  Final   Organism ID, Bacteria PSEUDOMONAS AERUGINOSA  Final      Susceptibility   Pseudomonas aeruginosa - MIC*    CEFTAZIDIME >=64 RESISTANT Resistant     CIPROFLOXACIN <=0.25 SENSITIVE Sensitive     GENTAMICIN <=1 SENSITIVE Sensitive     IMIPENEM 2 SENSITIVE Sensitive     * MODERATE PSEUDOMONAS AERUGINOSA  Virus culture     Status: None   Collection Time: 11/27/19  4:50 PM   Specimen: Leg, Left; Wound  Result Value Ref Range Status   Viral Culture Comment  Final    Comment: (NOTE) Preliminary Report: No virus isolated at 24 hours. Next report to follow after 4 days. Performed At: [facility] [address] [place] [address] [place], [place] [id number] Jolene SchimkeNagendra Sanjai MD AO:[id number]:[phone]    [facility]  Final    Comment: Performed at [facility], [address] [place], [place] [address]  Culture, blood (single) w Reflex to ID Panel     Status: None   Collection Time: 11/28/19  1:20 AM   Specimen: BLOOD  Result Value Ref Range Status   Specimen Description BLOOD RIGHT ANTECUBITAL  Final   Special Requests   Final    BOTTLES DRAWN AEROBIC AND ANAEROBIC Blood Culture results may not be optimal due to an inadequate volume of blood received in culture bottles   Culture   Final    NO GROWTH 5 DAYS Performed at [facility], [address] [place], [place] [address]    Report Status  12/03/2019 FINAL  Final  CULTURE, BLOOD (ROUTINE X 2) w Reflex to ID Panel     Status: None (Preliminary result)   Collection Time: 12/01/19 12:49 AM   Specimen: BLOOD  Result Value Ref Range Status   Specimen Description BLOOD RFOA  Final   Special Requests BOTTLES DRAWN AEROBIC AND ANAEROBIC BCAV  Final   Culture   Final    NO GROWTH 2 DAYS Performed at [facility], [address] [place], [place] [address]    Report Status PENDING  Incomplete  CULTURE, BLOOD (ROUTINE X 2) w Reflex to ID Panel     Status: None (Preliminary result)   Collection Time: 12/01/19  1:05 AM   Specimen: BLOOD  Result Value Ref Range Status   Specimen Description BLOOD LEFT HAND  Final   Special Requests BOTTLES DRAWN AEROBIC AND ANAEROBIC BCAV  Final   Culture   Final    NO GROWTH 2 DAYS Performed at [facility], [address] [place], [place] [address]    Report Status PENDING  Incomplete      Imaging Studies   No results found.   Medications   Scheduled Meds: . amiodarone  400 mg Oral BID  . Chlorhexidine Gluconate Cloth  6 each Topical Daily  . diclofenac Sodium  2 g Topical QID  . furosemide  80 mg Intravenous BID  . metoprolol tartrate  75 mg Oral BID  . polyethylene glycol  17 g Oral Daily  . potassium chloride  40 mEq Oral Daily  . senna  2 tablet Oral QHS  . sodium chloride flush  10-40 mL Intracatheter Q12H   Continuous Infusions: . sodium chloride Stopped (11/28/19 0255)  . sodium chloride 250 mL (11/30/19 2044)  .  ceFAZolin (ANCEF) IV 2 g (12/03/19 0510)  . ciprofloxacin 400 mg (12/02/19 2300)  .  clindamycin (CLEOCIN) IV 900 mg (12/03/19 0509)  . heparin 2,900 Units/hr (12/03/19 0514)  . sodium chloride         LOS: 9 days    Time spent: 25 minutes with > 50% spent in coordination of care and direct patient contact.    Pennie Banter, DO [facility]  12/03/2019, 8:14 AM    If 7PM-7AM, please contact night-coverage. How to  contact the [facility] Attending or Consulting provider 7A - 7P or covering provider during after hours 7P -7A, for this patient?    1. Check the care team in [facility] and look for a) attending/consulting [facility] provider listed and b) the [facility] team listed 2. Log into [email or web address] and use [facility] universal password to access. If you do not have the password, please contact the hospital operator. 3. Locate the [facility] provider you are looking for under [facility] and page to a number that you can be directly reached. 4. If you still have difficulty reaching the provider, please page the [facility] (Director on Call) for the Hospitalists listed on amion for assistance.

## 2019-12-04 ENCOUNTER — Encounter: Payer: Self-pay | Admitting: Internal Medicine

## 2019-12-04 ENCOUNTER — Inpatient Hospital Stay: Payer: Self-pay

## 2019-12-04 LAB — BASIC METABOLIC PANEL
Anion gap: 7 (ref 5–15)
BUN: 17 mg/dL (ref 6–20)
CO2: 30 mmol/L (ref 22–32)
Calcium: 8.3 mg/dL — ABNORMAL LOW (ref 8.9–10.3)
Chloride: 98 mmol/L (ref 98–111)
Creatinine, Ser: 0.92 mg/dL (ref 0.61–1.24)
GFR calc Af Amer: >60 mL/min (ref >60)
GFR calc non Af Amer: >60 mL/min (ref >60)
Glucose, Bld: 114 mg/dL — ABNORMAL HIGH (ref 70–99)
Potassium: 4.3 mmol/L (ref 3.5–5.1)
Sodium: 135 mmol/L (ref 135–145)

## 2019-12-04 LAB — CBC WITH DIFFERENTIAL/PLATELET
Abs Immature Granulocytes: 0.58 10*3/uL — ABNORMAL HIGH (ref 0.00–0.07)
Basophils Absolute: 0 10*3/uL (ref 0.0–0.1)
Basophils Relative: 0 %
Eosinophils Absolute: 0.1 10*3/uL (ref 0.0–0.5)
Eosinophils Relative: 1 %
HCT: 28.2 % — ABNORMAL LOW (ref 39.0–52.0)
Hemoglobin: 8.7 g/dL — ABNORMAL LOW (ref 13.0–17.0)
Immature Granulocytes: 5 %
Lymphocytes Relative: 8 %
Lymphs Abs: 1 10*3/uL (ref 0.7–4.0)
MCH: 21.6 pg — ABNORMAL LOW (ref 26.0–34.0)
MCHC: 30.9 g/dL (ref 30.0–36.0)
MCV: 70 fL — ABNORMAL LOW (ref 80.0–100.0)
Monocytes Absolute: 0.9 10*3/uL (ref 0.1–1.0)
Monocytes Relative: 8 %
Neutro Abs: 9.2 10*3/uL — ABNORMAL HIGH (ref 1.7–7.7)
Neutrophils Relative %: 78 %
Platelets: 305 10*3/uL (ref 150–400)
RBC: 4.03 MIL/uL — ABNORMAL LOW (ref 4.22–5.81)
RDW: 16.5 % — ABNORMAL HIGH (ref 11.5–15.5)
WBC: 11.8 10*3/uL — ABNORMAL HIGH (ref 4.0–10.5)
nRBC: 0 % (ref 0.0–0.2)

## 2019-12-04 LAB — HEPARIN LEVEL (UNFRACTIONATED): Heparin Unfractionated: 0.38 IU/mL (ref 0.30–0.70)

## 2019-12-04 MED ORDER — AMIODARONE HCL 200 MG PO TABS
200.0000 mg | ORAL_TABLET | Freq: Two times a day (BID) | ORAL | Status: DC
  Administered 2019-12-06 – 2019-12-11 (×11): 200 mg via ORAL
  Filled 2019-12-04 (×12): qty 1

## 2019-12-04 MED ORDER — SODIUM CHLORIDE 0.9 % IV SOLN
3.0000 g | Freq: Four times a day (QID) | INTRAVENOUS | Status: DC
  Administered 2019-12-05 – 2019-12-11 (×26): 3 g via INTRAVENOUS
  Filled 2019-12-04: qty 8
  Filled 2019-12-04: qty 0.86
  Filled 2019-12-04: qty 3
  Filled 2019-12-04 (×3): qty 8
  Filled 2019-12-04: qty 3
  Filled 2019-12-04 (×2): qty 8
  Filled 2019-12-04: qty 1
  Filled 2019-12-04 (×2): qty 3
  Filled 2019-12-04: qty 8
  Filled 2019-12-04: qty 3
  Filled 2019-12-04 (×2): qty 8
  Filled 2019-12-04 (×3): qty 3
  Filled 2019-12-04: qty 8
  Filled 2019-12-04: qty 1
  Filled 2019-12-04: qty 3
  Filled 2019-12-04: qty 8
  Filled 2019-12-04: qty 3
  Filled 2019-12-04 (×3): qty 8
  Filled 2019-12-04: qty 3
  Filled 2019-12-04: qty 1
  Filled 2019-12-04: qty 8
  Filled 2019-12-04: qty 1
  Filled 2019-12-04: qty 8
  Filled 2019-12-04: qty 3

## 2019-12-04 MED ORDER — FUROSEMIDE 10 MG/ML IJ SOLN
60.0000 mg | Freq: Two times a day (BID) | INTRAMUSCULAR | Status: DC
  Administered 2019-12-05 – 2019-12-09 (×9): 60 mg via INTRAVENOUS
  Filled 2019-12-04 (×9): qty 6

## 2019-12-04 MED ORDER — IOHEXOL 350 MG/ML SOLN
150.0000 mL | Freq: Once | INTRAVENOUS | Status: AC | PRN
  Administered 2019-12-04: 150 mL via INTRAVENOUS

## 2019-12-04 MED ORDER — METOPROLOL TARTRATE 50 MG PO TABS
50.0000 mg | ORAL_TABLET | Freq: Two times a day (BID) | ORAL | Status: DC
  Administered 2019-12-04 – 2019-12-09 (×11): 50 mg via ORAL
  Filled 2019-12-04 (×11): qty 1

## 2019-12-04 NOTE — Plan of Care (Signed)
VSS throughout shift, No falls this shift, POC reviewed with patient. Patients dressings changed, patiently tolerated fairly well. PRN medication provided. Educated patient on importance of ambulating and OOB.  Problem: Education: Goal: Knowledge of General Education information will improve Description: Including pain rating scale, medication(s)/side effects and non-pharmacologic comfort measures Outcome: Not Progressing   Problem: Health Behavior/Discharge Planning: Goal: Ability to manage health-related needs will improve Outcome: Not Progressing   Problem: Clinical Measurements: Goal: Ability to maintain clinical measurements within normal limits will improve Outcome: Not Progressing Goal: Will remain free from infection Outcome: Not Progressing Goal: Cardiovascular complication will be avoided Outcome: Not Progressing   Problem: Activity: Goal: Risk for activity intolerance will decrease Outcome: Not Progressing   Problem: Pain Managment: Goal: General experience of comfort will improve Outcome: Not Progressing   Problem: Skin Integrity: Goal: Risk for impaired skin integrity will decrease Outcome: Not Progressing

## 2019-12-04 NOTE — Progress Notes (Signed)
Occupational Therapy Treatment Patient Details Name: Alfred [name] MRN: [id number] DOB: February 13, 1966 Today's Date: 12/04/2019    History of present illness Alfred [name] is a 54yoM who comes to [facility] on 8/20 c pain and LEE. PMH: chronic BLE venous statis, hepatits. Pt admitted with BLE cellulitis. Since arrival pt having issues with fever and tachy into 140s-160s. At time of evaluation, pt continues to have significant LEE bilat.   OT comments  Upon entering the room, pt supine in bed and reports, " I had a bowel movement". Pt with large episode of incontinence in bed with max A for rolling L <> R in bed with use min verbal cuing for hand placement and use of bed rails. OT assisting pt with movement of B LEs secondary to being very limited with pain. Pt then needing maximal encouragement for EOB with max A for A with B LEs and trunk support. Pt sitting for several minutes and then maximal encouragement again needed to try standing. Bed elevated and pt standing with use of RW and max lifting assistance. Pt standing for ~1 minute with min A - mod A secondary to fatigue and increased pain. Pt unwilling to attempt transfer into recliner chair or take steps. Pt returning to supine with mod A for B LEs. OT encouraged pt to attempt stand and transfer this afternoon with PT as well. Pt is not insured and therefore will likely go home at discharge. Pt making progress this session.    Follow Up Recommendations  Home health OT;Supervision/Assistance - 24 hour    Equipment Recommendations  3 in 1 bedside commode    Recommendations for Other Services Other (comment) (none at this time)    Precautions / Restrictions Precautions Precautions: Fall Precaution Comments: bilat leg wounds, Left knee pain       Mobility Bed Mobility Overal bed mobility: Needs Assistance Bed Mobility: Sit to Supine;Supine to Sit     Supine to sit: Max assist Sit to supine: Max assist   General bed mobility comments:  Max A for B LEs and trunk. B LEs very painful with movement  Transfers Overall transfer level: Needs assistance   Transfers: Sit to/from Stand Sit to Stand: Max assist;From elevated surface         General transfer comment: pt required max encouragement to stand from elevated EOB with use of RW and max lifting assistance    Balance Overall balance assessment: Needs assistance Sitting-balance support: Single extremity supported;Feet supported Sitting balance-Leahy Scale: Good     Standing balance support: During functional activity;Bilateral upper extremity supported Standing balance-Leahy Scale: Poor Standing balance comment: reliance on RW with posterior bias requiring min - mod A for static standing          ADL either performed or assessed with clinical judgement   ADL        General ADL Comments: max A to roll L <> R for peri care hygiene after incontinent episode     Vision Baseline Vision/History: No visual deficits Patient Visual Report: No change from baseline            Cognition Arousal/Alertness: Awake/alert Behavior During Therapy: WFL for tasks assessed/performed Overall Cognitive Status: Within Functional Limits for tasks assessed                        Pertinent Vitals/ Pain       Pain Assessment: Faces Faces Pain Scale: Hurts whole lot Pain Location: bilat legs Pain Descriptors /  Indicators: Aching;Discomfort;Grimacing Pain Intervention(s): Limited activity within patient's tolerance;Monitored during session;Repositioned         Frequency  Min 1X/week        Progress Toward Goals  OT Goals(current goals can now be found in the care plan section)  Progress towards OT goals: Progressing toward goals  Acute Rehab OT Goals Patient Stated Goal: to move better OT Goal Formulation: With patient Time For Goal Achievement: 12/13/19 Potential to Achieve Goals: Good  Plan Discharge plan remains appropriate       AM-PAC OT "6  Clicks" Daily Activity     Outcome Measure   Help from another person eating meals?: None Help from another person taking care of personal grooming?: None Help from another person toileting, which includes using toliet, bedpan, or urinal?: A Lot Help from another person bathing (including washing, rinsing, drying)?: A Lot Help from another person to put on and taking off regular upper body clothing?: A Little Help from another person to put on and taking off regular lower body clothing?: Total 6 Click Score: 16    End of Session Equipment Utilized During Treatment: Rolling walker  OT Visit Diagnosis: Muscle weakness (generalized) (M62.81);Pain Pain - Right/Left:  (Bilateral) Pain - part of body: Leg   Activity Tolerance Patient limited by pain   Patient Left in bed;with call bell/phone within reach;with bed alarm set           Time: 9562-1308 OT Time Calculation (min): 31 min  Charges: OT General Charges $OT Visit: 1 Visit OT Treatments $Self Care/Home Management : 23-37 mins  Jackquline Denmark, MS, OTR/L , CBIS ascom [phone]  12/04/19, 1:11 PM

## 2019-12-04 NOTE — Progress Notes (Signed)
PROGRESS NOTE    Alfred [name]   NFA:[id number]  DOB: 16-Aug-1965  PCP: Patient, No Pcp Per    DOA: 11/24/2019 LOS: 10   Brief Narrative   Alfred [name] is a 54 y.o. male with history of chronic lower extremity venous stasis for the past 15 years but otherwise no medical history and on no medications who presented to the ED on 11/24/19 with 2-week history of progressively worsening bilateral lower extremity swelling, left greater than right associated with pain and skin breakdown with oozing. Reported subjective fevers.  In the ED, low-grade temperature of 99, tachycardic at 122 bpm, with normal blood pressure.  WBC normal at 9100, lactic acid borderline at 1.9.  Blood work showing creatinine of 1.27 above baseline of 1.16 but with prior baseline around 0.7.  Urinalysis showing proteinuria.  Chest x-ray showed no active disease.  Bilateral lower extremity Doppler negative for DVT bilaterally, but was suboptimal study due to body habitus and edema.    Admitted to hospitalist service.  Blood cultures positive for group G strep.  Being treated with IV antibiotics for cellulitis and bacteremia.       Assessment & Plan   Principal Problem:   Streptococcal sepsis, unspecified (HCC) Active Problems:   Cellulitis of left leg   AKI (acute kidney injury) (HCC)   Bacteremia   Lower extremity edema   Morbid obesity (HCC)   Tobacco abuse   Paroxysmal atrial fibrillation (HCC)   Acute on chronic heart failure with preserved ejection fraction (HFpEF) (HCC)   Atrial flutter (HCC)   Acute on chronic HFpEF - presented fluid overloaded, massive edema of BLE's up to proximal thighs and abdomen.  Slowly improving with IV diuresis.  Cardiology following.  Continue Lasix 80 mg IV BID.  Monitor I/O's, daily weights.  Low sodium diet.  Will require several days aggressive IV diuresis.  Net IO Since Admission: -18,707.8 mL [12/04/19 0742]   Intake/Output Summary (Last 24 hours) at  12/02/2019 1541 Last data filed at 12/02/2019 1345 Gross per 24 hour  Intake 1316 ml  Output 4200 ml  Net -2884 ml   Left lower extremity edema and weakness - diuresis has greatly improved edema of the RLE.  He may have a DVT.  Doppler ultrasound earlier this admission was not unable to exclude DVT due to poor study quality secondary to patient's body habitus.  He is being anticoagulated, on heparin however.  Will discuss case with radiology re alternative imaging option to evaluate further. This is significantly limiting his mobility (see PT note from today).  Sepsis secondary to group G Strep bacteremia - sepsis POA as evidenced by fever, tachycardia, and known source(s) of infection.  ID is following.  Continue Unasyn (initially was on Vanc Rocephin).  Monitor CBC.  Will need TEE to rule out endocarditis (cardiology wants further diuresis beforehand).  Repeat blood cultures negative to date.       Dizziness - on standing with PT.  Will check orthostatics.  Cellulitis of left leg - POA, due to severe venous stasis.  Antibiotic as above.  Wound care.  PRN analgesics.  Generalized weakness - patient has not been out of bed or attempting any mobility.  Prior to admission was ambulatory.  PT recommends SNF, but patient is uninsured.  Out of bed to chair at least BID.  Sit up edge of bed.  Encourage as much mobility as possible.  Reposition every 2 hours.    AKI (acute kidney injury) - POA, resolved.  Monitor BMP.  Lower extremity edema and chronic venous stasis.  Wound care.  Monitor.  PT recommends SNF. Venous dopplers of lower extremities on admission were unable to exclude DVT, suboptimal study due to body habitus.  Atrial flutter - Currently sinus rhythm.  CHA2DS2-VASc of 1.  Cardiology following.  Continue heparin gtt, transition to DOAC on discharge.  Continue PO Amiodarone.  Continue Lopressor 50 mg PO BID.    Microcytic anemia - mild on admission, progressive.  Hbg trend from admission  forward: 12.9>>10.9>>10.2>>...8.9 today.   Anemia panel with AM labs.  Check FOBT.  Monitor CBC.  Hepatitis C - will need GI follow up outpatient to discuss treatment.  Viral load > 2.49million.   Morbid obesity - Body mass index is 46.74 kg/m. Complicates overall care and prognosis.  Tobacco abuse - counseled on cessation.    DVT prophylaxis: on heparin gtt    Diet:  Diet Orders (From admission, onward)    Start     Ordered   11/28/19 0756  Diet heart healthy/carb modified Room service appropriate? Yes; Fluid consistency: Thin  Diet effective now       Question Answer Comment  Diet-HS Snack? Nothing   Room service appropriate? Yes   Fluid consistency: Thin      11/28/19 0755            Code Status: Full Code    Subjective 12/04/19    Patient seen this AM at bedside.  Says left leg still weak and painful, can't move it on his own.  Right leg is better.  Denies acute complaints.  Disposition Plan & Communication   Status is: Inpatient  Remains inpatient appropriate because:IV treatments appropriate due to intensity of illness or inability to take PO.   Ongoing IV diuresis and IV antibiotics for bacteremia.   Dispo: The patient is from: Home              Anticipated d/c is to: Home              Anticipated d/c date is: 2-3 days              Patient currently is not medically stable to d/c.    Family Communication: none at bedside, will attempt to call   Consults, Procedures, Significant Events   Consultants:   Cardiology  Infectious disease  Wound care RN  Procedures:   None  Antimicrobials:   Unasyn    Objective   Vitals:   12/03/19 1530 12/03/19 1600 12/03/19 2013 12/04/19 0438  BP:  122/72 (!) 141/61 128/79  Pulse:  68 73 68  Resp: 20 20 16 18   Temp:  98.1 F (36.7 C) 98.5 F (36.9 C) 99.3 F (37.4 C)  TempSrc:  Oral Oral Oral  SpO2: 99% 100% 100% 100%  Weight:      Height:        Intake/Output Summary (Last 24 hours) at  12/04/2019 0742 Last data filed at 12/04/2019 0700 Gross per 24 hour  Intake 720 ml  Output 6700 ml  Net -5980 ml   Filed Weights   12/01/19 0357 12/02/19 0539 12/03/19 0448  Weight: (!) 190.5 kg (!) 188.8 kg (!) 188.2 kg    Physical Exam:  General exam: awake, alert, no acute distress, obese Respiratory system: CTAB but diminished due to body habitus, normal respiratory effort. On room air. Cardiovascular system: normal S1/S2, RRR, RLE edema trace at most, LLE edema persistent. Central nervous system: A&O x3. no gross focal  neurologic deficits, normal speech Extremities: b/l LE's wrapped and offloading boots in place.   Psychiatry: normal mood, congruent affect  Labs   Data Reviewed: I have personally reviewed following labs and imaging studies  CBC: Recent Labs  Lab 11/30/19 0412 12/01/19 0059 12/02/19 0532 12/03/19 0614 12/04/19 0435  WBC 14.7* 14.1* 12.3* 11.4* 11.8*  NEUTROABS 11.4*  --  9.8* 9.0* 9.2*  HGB 9.5* 8.9* 8.5* 9.1* 8.7*  HCT 30.5* 27.8* 26.8* 29.0* 28.2*  MCV 68.5* 68.0* 68.0* 68.4* 70.0*  PLT 217 223 252 245 305   Basic Metabolic Panel: Recent Labs  Lab 11/29/19 0324 12/01/19 0059 12/02/19 0532 12/03/19 0614 12/04/19 0435  NA 138 136 134* 132* 135  K 3.6 4.0 3.7 3.8 4.3  CL 100 96* 96* 94* 98  CO2 26 29 33* 27 30  GLUCOSE 132* 117* 118* 118* 114*  BUN 14 14 13 14 17   CREATININE 0.92 0.96 0.82 0.90 0.92  CALCIUM 8.0* 7.8* 7.5* 8.1* 8.3*  MG  --  1.9  --  2.1  --    GFR: Estimated Creatinine Clearance: 172.7 mL/min (by C-G formula based on SCr of 0.92 mg/dL). Liver Function Tests: No results for input(s): AST, ALT, ALKPHOS, BILITOT, PROT, ALBUMIN in the last 168 hours. No results for input(s): LIPASE, AMYLASE in the last 168 hours. No results for input(s): AMMONIA in the last 168 hours. Coagulation Profile: No results for input(s): INR, PROTIME in the last 168 hours. Cardiac Enzymes: No results for input(s): CKTOTAL, CKMB, CKMBINDEX,  TROPONINI in the last 168 hours. BNP (last 3 results) No results for input(s): PROBNP in the last 8760 hours. HbA1C: No results for input(s): HGBA1C in the last 72 hours. CBG: No results for input(s): GLUCAP in the last 168 hours. Lipid Profile: No results for input(s): CHOL, HDL, LDLCALC, TRIG, CHOLHDL, LDLDIRECT in the last 72 hours. Thyroid Function Tests: No results for input(s): TSH, T4TOTAL, FREET4, T3FREE, THYROIDAB in the last 72 hours. Anemia Panel: Recent Labs    12/02/19 0527 12/02/19 0532  VITAMINB12 681  --   FOLATE  --  13.9  FERRITIN  --  665*  TIBC  --  245*  IRON  --  24*  RETICCTPCT  --  1.2   Sepsis Labs: No results for input(s): PROCALCITON, LATICACIDVEN in the last 168 hours.  Recent Results (from the past 240 hour(s))  Culture, blood (Routine x 2)     Status: None   Collection Time: 11/24/19  3:14 PM   Specimen: BLOOD  Result Value Ref Range Status   Specimen Description BLOOD BLOOD RIGHT HAND  Final   Special Requests   Final    BOTTLES DRAWN AEROBIC AND ANAEROBIC Blood Culture results may not be optimal due to an inadequate volume of blood received in culture bottles   Culture   Final    NO GROWTH 5 DAYS Performed at [facility], [address], [place], [place] [address]    Report Status 11/29/2019 FINAL  Final  Culture, blood (Routine x 2)     Status: Abnormal   Collection Time: 11/24/19  5:20 PM   Specimen: BLOOD  Result Value Ref Range Status   Specimen Description   Final    BLOOD BLOOD RIGHT ARM Performed at [facility], [address], [place], [place] [address]    Special Requests   Final    BOTTLES DRAWN AEROBIC AND ANAEROBIC Blood Culture adequate volume Performed at [facility], [address]  [place], [place] [address]    Culture  Setup Time   Final    Organism ID to follow GRAM POSITIVE COCCI IN BOTH AEROBIC AND ANAEROBIC BOTTLES CRITICAL RESULT CALLED TO, READ BACK BY AND VERIFIED WITH:  SCOTT HALL 11/25/19 AT 0530 HS Performed at [facility], [address] [place], [place] [address]    Culture STREPTOCOCCUS GROUP G (A)  Final   Report Status 11/27/2019 FINAL  Final   Organism ID, Bacteria STREPTOCOCCUS GROUP G  Final      Susceptibility   Streptococcus group g - MIC*    CLINDAMYCIN RESISTANT Resistant     AMPICILLIN <=0.25 SENSITIVE Sensitive     ERYTHROMYCIN >=8 RESISTANT Resistant     VANCOMYCIN 0.5 SENSITIVE Sensitive     CEFTRIAXONE <=0.12 SENSITIVE Sensitive     LEVOFLOXACIN 0.5 SENSITIVE Sensitive     * STREPTOCOCCUS GROUP G  Blood Culture ID Panel (Reflexed)     Status: Abnormal   Collection Time: 11/24/19  5:20 PM  Result Value Ref Range Status   Enterococcus faecalis NOT DETECTED NOT DETECTED Final   Enterococcus Faecium NOT DETECTED NOT DETECTED Final   Listeria monocytogenes NOT DETECTED NOT DETECTED Final   Staphylococcus species NOT DETECTED NOT DETECTED Final   Staphylococcus aureus (BCID) NOT DETECTED NOT DETECTED Final   Staphylococcus epidermidis NOT DETECTED NOT DETECTED Final   Staphylococcus lugdunensis NOT DETECTED NOT DETECTED Final   Streptococcus species DETECTED (A) NOT DETECTED Final    Comment: Not Enterococcus species, Streptococcus agalactiae, Streptococcus pyogenes, or Streptococcus pneumoniae. CRITICAL RESULT CALLED TO, READ BACK BY AND VERIFIED WITH: SCOTT HALL 11/25/19 AT 0530 HS    Streptococcus agalactiae NOT DETECTED NOT DETECTED Final   Streptococcus pneumoniae NOT DETECTED NOT DETECTED Final   Streptococcus pyogenes NOT DETECTED NOT DETECTED Final   A.calcoaceticus-baumannii NOT DETECTED NOT DETECTED Final   Bacteroides fragilis NOT DETECTED NOT DETECTED Final   Enterobacterales NOT DETECTED NOT DETECTED Final   Enterobacter cloacae complex NOT DETECTED NOT DETECTED Final   Escherichia coli NOT DETECTED NOT DETECTED Final   Klebsiella aerogenes NOT DETECTED NOT DETECTED Final   Klebsiella oxytoca NOT DETECTED  NOT DETECTED Final   Klebsiella pneumoniae NOT DETECTED NOT DETECTED Final   Proteus species NOT DETECTED NOT DETECTED Final   Salmonella species NOT DETECTED NOT DETECTED Final   Serratia marcescens NOT DETECTED NOT DETECTED Final   Haemophilus influenzae NOT DETECTED NOT DETECTED Final   Neisseria meningitidis NOT DETECTED NOT DETECTED Final   Pseudomonas aeruginosa NOT DETECTED NOT DETECTED Final   Stenotrophomonas maltophilia NOT DETECTED NOT DETECTED Final   Candida albicans NOT DETECTED NOT DETECTED Final   Candida auris NOT DETECTED NOT DETECTED Final   Candida glabrata NOT DETECTED NOT DETECTED Final   Candida krusei NOT DETECTED NOT DETECTED Final   Candida parapsilosis NOT DETECTED NOT DETECTED Final   Candida tropicalis NOT DETECTED NOT DETECTED Final   Cryptococcus neoformans/gattii NOT DETECTED NOT DETECTED Final    Comment: Performed at [facility], [address] [place], [place] [address]  SARS Coronavirus 2 by RT PCR (hospital order, performed in [facility] hospital lab) Nasopharyngeal Nasopharyngeal Swab     Status: None   Collection Time: 11/24/19  7:40 PM   Specimen: Nasopharyngeal Swab  Result Value Ref Range Status   SARS Coronavirus 2 NEGATIVE NEGATIVE Final    Comment: (NOTE) SARS-CoV-2 target nucleic acids are NOT DETECTED.  The SARS-CoV-2 RNA is generally detectable in upper and lower respiratory specimens  during the acute phase of infection. The lowest concentration of SARS-CoV-2 viral copies this assay can detect is 250 copies / mL. A negative result does not preclude SARS-CoV-2 infection and should not be used as the sole basis for treatment or other patient management decisions.  A negative result may occur with improper specimen collection / handling, submission of specimen other than nasopharyngeal swab, presence of viral mutation(s) within the areas targeted by this assay, and inadequate number of viral copies (<250 copies / mL). A  negative result must be combined with clinical observations, patient history, and epidemiological information.  Fact Sheet for Patients:   BoilerBrush.com.cy  Fact Sheet for Healthcare Providers: https://pope.com/  This test is not yet approved or  cleared by the [place] FDA and has been authorized for detection and/or diagnosis of SARS-CoV-2 by FDA under an Emergency Use Authorization (EUA).  This EUA will remain in effect (meaning this test can be used) for the duration of the COVID-19 declaration under Section 564(b)(1) of the Act, 21 U.S.C. section 360bbb-3(b)(1), unless the authorization is terminated or revoked sooner.  Performed at [facility], [address] [place], [place] [address]   C Difficile Quick Screen w PCR reflex     Status: None   Collection Time: 11/25/19 10:04 AM   Specimen: STOOL  Result Value Ref Range Status   C Diff antigen NEGATIVE NEGATIVE Final   C Diff toxin NEGATIVE NEGATIVE Final   C Diff interpretation No C. difficile detected.  Final    Comment: Performed at [facility], [address] [place], [place] [address]  MRSA PCR Screening     Status: None   Collection Time: 11/25/19  4:20 PM   Specimen: Nasopharyngeal  Result Value Ref Range Status   MRSA by PCR NEGATIVE NEGATIVE Final    Comment:        The GeneXpert MRSA Assay (FDA approved for NASAL specimens only), is one component of a comprehensive MRSA colonization surveillance program. It is not intended to diagnose MRSA infection nor to guide or monitor treatment for MRSA infections. Performed at [facility], [address] [place], [place] [address]   Aerobic Culture (superficial specimen)     Status: None   Collection Time: 11/27/19  4:50 PM   Specimen: Wound  Result Value Ref Range Status   Specimen Description   Final    WOUND Performed at [facility], [address]  [place], [place] [address]    Special Requests   Final    LEFT LEG/SHIN Performed at [facility], [address] [place], [place] [address]    Gram Stain   Final    RARE WBC PRESENT,BOTH PMN AND MONONUCLEAR MODERATE GRAM POSITIVE COCCI FEW GRAM NEGATIVE RODS    Culture   Final    MODERATE PSEUDOMONAS AERUGINOSA FEW STREPTOCOCCUS GROUP G Beta hemolytic streptococci are predictably susceptible to penicillin and other beta lactams. Susceptibility testing not routinely performed. Performed at [facility], [address], [place], [place] [address]    Report Status 12/02/2019 FINAL  Final   Organism ID, Bacteria PSEUDOMONAS AERUGINOSA  Final      Susceptibility   Pseudomonas aeruginosa - MIC*    CEFTAZIDIME >=64 RESISTANT Resistant     CIPROFLOXACIN <=0.25 SENSITIVE Sensitive     GENTAMICIN <=1 SENSITIVE Sensitive     IMIPENEM 2 SENSITIVE Sensitive     * MODERATE PSEUDOMONAS AERUGINOSA  Virus culture     Status: None  Collection Time: 11/27/19  4:50 PM   Specimen: Leg, Left; Wound  Result Value Ref Range Status   Viral Culture Comment  Final    Comment: (NOTE) Preliminary Report: No virus isolated at 24 hours. Next report to follow after 4 days. Performed At: [facility] [address] [place], [place] [id number] Jolene Schimke MD WU:[id number]    Source of Sample WD LEFT [facility]  Final    Comment: Performed at [facility], [address] [place], [place] [address]  Culture, blood (single) w Reflex to ID Panel     Status: None   Collection Time: 11/28/19  1:20 AM   Specimen: BLOOD  Result Value Ref Range Status   Specimen Description BLOOD RIGHT ANTECUBITAL  Final   Special Requests   Final    BOTTLES DRAWN AEROBIC AND ANAEROBIC Blood Culture results may not be optimal due to an inadequate volume of blood received in culture bottles   Culture   Final    NO GROWTH 5 DAYS Performed at [facility], [address] [place],  [place] [address]    Report Status 12/03/2019 FINAL  Final  CULTURE, BLOOD (ROUTINE X 2) w Reflex to ID Panel     Status: None (Preliminary result)   Collection Time: 12/01/19 12:49 AM   Specimen: BLOOD  Result Value Ref Range Status   Specimen Description BLOOD RFOA  Final   Special Requests BOTTLES DRAWN AEROBIC AND ANAEROBIC BCAV  Final   Culture   Final    NO GROWTH 2 DAYS Performed at [facility], [address], [place], [place] [address]    Report Status PENDING  Incomplete  CULTURE, BLOOD (ROUTINE X 2) w Reflex to ID Panel     Status: None (Preliminary result)   Collection Time: 12/01/19  1:05 AM   Specimen: BLOOD  Result Value Ref Range Status   Specimen Description BLOOD LEFT HAND  Final   Special Requests BOTTLES DRAWN AEROBIC AND ANAEROBIC BCAV  Final   Culture   Final    NO GROWTH 2 DAYS Performed at [facility], [address] [place], [place] [address]    Report Status PENDING  Incomplete      Imaging Studies   No results found.   Medications   Scheduled Meds:  amiodarone  400 mg Oral BID   Chlorhexidine Gluconate Cloth  6 each Topical Daily   diclofenac Sodium  2 g Topical QID   furosemide  80 mg Intravenous BID   metoprolol tartrate  75 mg Oral BID   polyethylene glycol  17 g Oral Daily   potassium chloride  40 mEq Oral Daily   senna  2 tablet Oral QHS   sodium chloride flush  10-40 mL Intracatheter Q12H   Continuous Infusions:  sodium chloride Stopped (11/28/19 0255)   sodium chloride 250 mL (11/30/19 2044)    ceFAZolin (ANCEF) IV 2 g (12/04/19 0523)   ciprofloxacin 400 mg (12/03/19 2230)   clindamycin (CLEOCIN) IV 900 mg (12/04/19 0524)   heparin 2,900 Units/hr (12/04/19 0646)   sodium chloride         LOS: 10 days    Time spent: 25 minutes with > 50% spent in coordination of care and direct patient contact.    Pennie Banter, DO [facility]  12/04/2019, 7:42 AM    If 7PM-7AM, please  contact night-coverage. How to contact the [facility] Attending or Consulting provider 7A - 7P or covering provider during after hours  7P -7A, for this patient?    1. Check the care team in [facility] and look for a) attending/consulting TR[facility] provider listed and b) the [facility] team listed 2. Log into [email or web address] and use [place]'s universal password to access. If you do not have the password, please contact the hospital operator. 3. Locate the [facility] provider you are looking for under [facility] and page to a number that you can be directly reached. If you still have difficulty reaching the provider, please page the [facility] (Director on Call) for the Hospitalists listed on amion for assistance.  PROGRESS NOTE    Alfred [name]   YYT:[id number]  DOB: 14-Dec-1965  PCP: Patient, No Pcp Per    DOA: 11/24/2019 LOS: 10   Brief Narrative   Alfred [name] is a 54 y.o. male with history of chronic lower extremity venous stasis for the past 15 years but otherwise no medical history and on no medications who presented to the ED on 11/24/19 with 2-week history of progressively worsening bilateral lower extremity swelling, left greater than right associated with pain and skin breakdown with oozing. Reported subjective fevers.  In the ED, low-grade temperature of 99, tachycardic at 122 bpm, with normal blood pressure.  WBC normal at 9100, lactic acid borderline at 1.9.  Blood work showing creatinine of 1.27 above baseline of 1.16 but with prior baseline around 0.7.  Urinalysis showing proteinuria.  Chest x-ray showed no active disease.  Bilateral lower extremity Doppler negative for DVT bilaterally, but was suboptimal study due to body habitus and edema.    Admitted to hospitalist service.  Blood cultures positive for group G strep.  Being treated with IV antibiotics for cellulitis and bacteremia.       Assessment & Plan   Principal Problem:   Streptococcal sepsis, unspecified (HCC) Active Problems:    Cellulitis of left leg   AKI (acute kidney injury) (HCC)   Bacteremia   Lower extremity edema   Morbid obesity (HCC)   Tobacco abuse   Paroxysmal atrial fibrillation (HCC)   Acute on chronic heart failure with preserved ejection fraction (HFpEF) (HCC)   Atrial flutter (HCC)   Acute on chronic HFpEF - presented fluid overloaded, massive edema of BLE's up to proximal thighs and abdomen.  Slowly improving with IV diuresis.  Cardiology following.  Continue Lasix 80 mg IV BID.  Monitor I/O's, daily weights.  Low sodium diet.  Will require several days aggressive IV diuresis.  Net IO Since Admission: -18,707.8 mL [12/04/19 0742]   Intake/Output Summary (Last 24 hours) at 12/02/2019 1541 Last data filed at 12/02/2019 1345 Gross per 24 hour  Intake 1316 ml  Output 4200 ml  Net -2884 ml    Sepsis secondary to group G Strep bacteremia - sepsis POA as evidenced by fever, tachycardia, and known source(s) of infection.  ID is following.  Continue Unasyn (initially was on Vanc Rocephin).  Monitor CBC.  Will need TEE to rule out endocarditis (cardiology wants further diuresis beforehand).  Follow up repeat blood cultures.       Cellulitis of left leg - POA, due to severe venous stasis.  Antibiotic as above.  Wound care.  PRN analgesics.  Generalized weakness - patient has not been out of bed or attempting any mobility.  Prior to admission was ambulatory.  PT recommends SNF, but patient is uninsured.  Out of bed to chair at least BID.  Sit up edge of bed.  Encourage as much mobility as possible.  Reposition  every 2 hours.    AKI (acute kidney injury) - POA, resolved.  Monitor BMP.  Lower extremity edema - chronic venous stasis.  Wound care.  Monitor.  PT recommends SNF. Venous dopplers of lower extremities on admission were unable to exclude DVT, suboptimal study due to body habitus.  Atrial flutter - Currently sinus rhythm.  CHA2DS2-VASc of 1.  Cardiology following.  Continue heparin gtt,  transition to DOAC on discharge.  Continue PO Amiodarone.  Continue Lopressor 50 mg PO BID.    Microcytic anemia - mild on admission, progressive.  Hbg trend from admission forward: 12.9>>10.9>>10.2>>...8.9 today.   Anemia panel with AM labs.  Check FOBT.  Monitor CBC.  Hepatitis C - will need GI follow up outpatient to discuss treatment.  Viral load > 2.63million.   Morbid obesity - Body mass index is 46.74 kg/m. Complicates overall care and prognosis.  Tobacco abuse - counseled on cessation.    DVT prophylaxis: on heparin gtt    Diet:  Diet Orders (From admission, onward)    Start     Ordered   11/28/19 0756  Diet heart healthy/carb modified Room service appropriate? Yes; Fluid consistency: Thin  Diet effective now       Question Answer Comment  Diet-HS Snack? Nothing   Room service appropriate? Yes   Fluid consistency: Thin      11/28/19 0755            Code Status: Full Code    Subjective 12/04/19    Patient seen this AM at bedside.  Says left leg still numb/painful with swelling, right is better.  No acute complaints.  Disposition Plan & Communication   Status is: Inpatient  Remains inpatient appropriate because:IV treatments appropriate due to intensity of illness or inability to take PO.   Ongoing IV diuresis and IV antibiotics for bacteremia.   Dispo: The patient is from: Home              Anticipated d/c is to: Home              Anticipated d/c date is: 3 days              Patient currently is not medically stable to d/c.    Family Communication: none at bedside, will attempt to call   Consults, Procedures, Significant Events   Consultants:   Cardiology  Infectious disease  Wound care RN  Procedures:   None  Antimicrobials:   Unasyn    Objective   Vitals:   12/03/19 1530 12/03/19 1600 12/03/19 2013 12/04/19 0438  BP:  122/72 (!) 141/61 128/79  Pulse:  68 73 68  Resp: 20 20 16 18   Temp:  98.1 F (36.7 C) 98.5 F (36.9 C) 99.3  F (37.4 C)  TempSrc:  Oral Oral Oral  SpO2: 99% 100% 100% 100%  Weight:      Height:        Intake/Output Summary (Last 24 hours) at 12/04/2019 0742 Last data filed at 12/04/2019 0700 Gross per 24 hour  Intake 720 ml  Output 6700 ml  Net -5980 ml   Filed Weights   12/01/19 0357 12/02/19 0539 12/03/19 0448  Weight: (!) 190.5 kg (!) 188.8 kg (!) 188.2 kg    Physical Exam:  General exam: awake, alert, no acute distress, obese Respiratory system: CTAB but diminished due to body habitus, normal respiratory effort. On room air. Cardiovascular system: normal S1/S2, RRR, RLE edema nearly resolved trace at most, LLE edema persistent.  Central nervous system: A&O x3. no gross focal neurologic deficits, normal speech Extremities: b/l LE's wrapped and offloading boots in place.  Onychomycosis. Psychiatry: normal mood, congruent affect  Labs   Data Reviewed: I have personally reviewed following labs and imaging studies  CBC: Recent Labs  Lab 11/30/19 0412 12/01/19 0059 12/02/19 0532 12/03/19 0614 12/04/19 0435  WBC 14.7* 14.1* 12.3* 11.4* 11.8*  NEUTROABS 11.4*  --  9.8* 9.0* 9.2*  HGB 9.5* 8.9* 8.5* 9.1* 8.7*  HCT 30.5* 27.8* 26.8* 29.0* 28.2*  MCV 68.5* 68.0* 68.0* 68.4* 70.0*  PLT 217 223 252 245 305   Basic Metabolic Panel: Recent Labs  Lab 11/29/19 0324 12/01/19 0059 12/02/19 0532 12/03/19 0614 12/04/19 0435  NA 138 136 134* 132* 135  K 3.6 4.0 3.7 3.8 4.3  CL 100 96* 96* 94* 98  CO2 26 29 33* 27 30  GLUCOSE 132* 117* 118* 118* 114*  BUN CREATININE 0.92 0.96 0.82 0.90 0.92  CALCIUM 8.0* 7.8* 7.5* 8.1* 8.3*  MG  --  1.9  --  2.1  --    GFR: Estimated Creatinine Clearance: 172.7 mL/min (by C-G formula based on SCr of 0.92 mg/dL). Liver Function Tests: No results for input(s): AST, ALT, ALKPHOS, BILITOT, PROT, ALBUMIN in the last 168 hours. No results for input(s): LIPASE, AMYLASE in the last 168 hours. No results for input(s): AMMONIA in the  last 168 hours. Coagulation Profile: No results for input(s): INR, PROTIME in the last 168 hours. Cardiac Enzymes: No results for input(s): CKTOTAL, CKMB, CKMBINDEX, TROPONINI in the last 168 hours. BNP (last 3 results) No results for input(s): PROBNP in the last 8760 hours. HbA1C: No results for input(s): HGBA1C in the last 72 hours. CBG: No results for input(s): GLUCAP in the last 168 hours. Lipid Profile: No results for input(s): CHOL, HDL, LDLCALC, TRIG, CHOLHDL, LDLDIRECT in the last 72 hours. Thyroid Function Tests: No results for input(s): TSH, T4TOTAL, FREET4, T3FREE, THYROIDAB in the last 72 hours. Anemia Panel: Recent Labs    12/02/19 0527 12/02/19 0532  VITAMINB12 681  --   FOLATE  --  13.9  FERRITIN  --  665*  TIBC  --  245*  IRON  --  24*  RETICCTPCT  --  1.2   Sepsis Labs: No results for input(s): PROCALCITON, LATICACIDVEN in the last 168 hours.  Recent Results (from the past 240 hour(s))  Culture, blood (Routine x 2)     Status: None   Collection Time: 11/24/19  3:14 PM   Specimen: BLOOD  Result Value Ref Range Status   Specimen Description BLOOD BLOOD RIGHT HAND  Final   Special Requests   Final    BOTTLES DRAWN AEROBIC AND ANAEROBIC Blood Culture results may not be optimal due to an inadequate volume of blood received in culture bottles   Culture   Final    NO GROWTH 5 DAYS Performed at [facility], [address] [place], [place] [address]    Report Status 11/29/2019 FINAL  Final  Culture, blood (Routine x 2)     Status: Abnormal   Collection Time: 11/24/19  5:20 PM   Specimen: BLOOD  Result Value Ref Range Status   Specimen Description   Final    BLOOD BLOOD RIGHT ARM Performed at [facility], [address] [place], [place] [address]    Special Requests   Final    BOTTLES DRAWN AEROBIC AND ANAEROBIC Blood Culture adequate volume Performed  at [facility], [address] [place], [place] [address]    Culture   Setup Time   Final    Organism ID to follow GRAM POSITIVE COCCI IN BOTH AEROBIC AND ANAEROBIC BOTTLES CRITICAL RESULT CALLED TO, READ BACK BY AND VERIFIED WITH: SCOTT HALL 11/25/19 AT 0530 HS Performed at [facility], [address] [place], [place] [address]    Culture STREPTOCOCCUS GROUP G (A)  Final   Report Status 11/27/2019 FINAL  Final   Organism ID, Bacteria STREPTOCOCCUS GROUP G  Final      Susceptibility   Streptococcus group g - MIC*    CLINDAMYCIN RESISTANT Resistant     AMPICILLIN <=0.25 SENSITIVE Sensitive     ERYTHROMYCIN >=8 RESISTANT Resistant     VANCOMYCIN 0.5 SENSITIVE Sensitive     CEFTRIAXONE <=0.12 SENSITIVE Sensitive     LEVOFLOXACIN 0.5 SENSITIVE Sensitive     * STREPTOCOCCUS GROUP G  Blood Culture ID Panel (Reflexed)     Status: Abnormal   Collection Time: 11/24/19  5:20 PM  Result Value Ref Range Status   Enterococcus faecalis NOT DETECTED NOT DETECTED Final   Enterococcus Faecium NOT DETECTED NOT DETECTED Final   Listeria monocytogenes NOT DETECTED NOT DETECTED Final   Staphylococcus species NOT DETECTED NOT DETECTED Final   Staphylococcus aureus (BCID) NOT DETECTED NOT DETECTED Final   Staphylococcus epidermidis NOT DETECTED NOT DETECTED Final   Staphylococcus lugdunensis NOT DETECTED NOT DETECTED Final   Streptococcus species DETECTED (A) NOT DETECTED Final    Comment: Not Enterococcus species, Streptococcus agalactiae, Streptococcus pyogenes, or Streptococcus pneumoniae. CRITICAL RESULT CALLED TO, READ BACK BY AND VERIFIED WITH: SCOTT HALL 11/25/19 AT 0530 HS    Streptococcus agalactiae NOT DETECTED NOT DETECTED Final   Streptococcus pneumoniae NOT DETECTED NOT DETECTED Final   Streptococcus pyogenes NOT DETECTED NOT DETECTED Final   A.calcoaceticus-baumannii NOT DETECTED NOT DETECTED Final   Bacteroides fragilis NOT DETECTED NOT DETECTED Final   Enterobacterales NOT DETECTED NOT DETECTED Final   Enterobacter cloacae complex NOT DETECTED  NOT DETECTED Final   Escherichia coli NOT DETECTED NOT DETECTED Final   Klebsiella aerogenes NOT DETECTED NOT DETECTED Final   Klebsiella oxytoca NOT DETECTED NOT DETECTED Final   Klebsiella pneumoniae NOT DETECTED NOT DETECTED Final   Proteus species NOT DETECTED NOT DETECTED Final   Salmonella species NOT DETECTED NOT DETECTED Final   Serratia marcescens NOT DETECTED NOT DETECTED Final   Haemophilus influenzae NOT DETECTED NOT DETECTED Final   Neisseria meningitidis NOT DETECTED NOT DETECTED Final   Pseudomonas aeruginosa NOT DETECTED NOT DETECTED Final   Stenotrophomonas maltophilia NOT DETECTED NOT DETECTED Final   Candida albicans NOT DETECTED NOT DETECTED Final   Candida auris NOT DETECTED NOT DETECTED Final   Candida glabrata NOT DETECTED NOT DETECTED Final   Candida krusei NOT DETECTED NOT DETECTED Final   Candida parapsilosis NOT DETECTED NOT DETECTED Final   Candida tropicalis NOT DETECTED NOT DETECTED Final   Cryptococcus neoformans/gattii NOT DETECTED NOT DETECTED Final    Comment: Performed at [facility], [address] [place], [place] [address]  SARS Coronavirus 2 by RT PCR (hospital order, performed in [facility] hospital lab) Nasopharyngeal Nasopharyngeal Swab     Status: None   Collection Time: 11/24/19  7:40 PM   Specimen: Nasopharyngeal Swab  Result Value Ref Range Status   SARS Coronavirus 2 NEGATIVE NEGATIVE Final    Comment: (NOTE) SARS-CoV-2 target nucleic acids are NOT DETECTED.  The SARS-CoV-2 RNA is  generally detectable in upper and lower respiratory specimens during the acute phase of infection. The lowest concentration of SARS-CoV-2 viral copies this assay can detect is 250 copies / mL. A negative result does not preclude SARS-CoV-2 infection and should not be used as the sole basis for treatment or other patient management decisions.  A negative result may occur with improper specimen collection / handling, submission of specimen  other than nasopharyngeal swab, presence of viral mutation(s) within the areas targeted by this assay, and inadequate number of viral copies (<250 copies / mL). A negative result must be combined with clinical observations, patient history, and epidemiological information.  Fact Sheet for Patients:   BoilerBrush.com.cy  Fact Sheet for Healthcare Providers: https://pope.com/  This test is not yet approved or  cleared by the [place] FDA and has been authorized for detection and/or diagnosis of SARS-CoV-2 by FDA under an Emergency Use Authorization (EUA).  This EUA will remain in effect (meaning this test can be used) for the duration of the COVID-19 declaration under Section 564(b)(1) of the Act, 21 U.S.C. section 360bbb-3(b)(1), unless the authorization is terminated or revoked sooner.  Performed at [facility], [address] [place], [place] [address]   C Difficile Quick Screen w PCR reflex     Status: None   Collection Time: 11/25/19 10:04 AM   Specimen: STOOL  Result Value Ref Range Status   C Diff antigen NEGATIVE NEGATIVE Final   C Diff toxin NEGATIVE NEGATIVE Final   C Diff interpretation No C. difficile detected.  Final    Comment: Performed at [facility], [address] [place], [place] [address]  MRSA PCR Screening     Status: None   Collection Time: 11/25/19  4:20 PM   Specimen: Nasopharyngeal  Result Value Ref Range Status   MRSA by PCR NEGATIVE NEGATIVE Final    Comment:        The GeneXpert MRSA Assay (FDA approved for NASAL specimens only), is one component of a comprehensive MRSA colonization surveillance program. It is not intended to diagnose MRSA infection nor to guide or monitor treatment for MRSA infections. Performed at [facility], [address] [place], [place] [address]   Aerobic Culture (superficial specimen)     Status: None   Collection Time: 11/27/19   4:50 PM   Specimen: Wound  Result Value Ref Range Status   Specimen Description   Final    WOUND Performed at [facility], [address] [place], [place] [address]    Special Requests   Final    LEFT LEG/SHIN Performed at [facility], [address] [place], [place] [address]    Gram Stain   Final    RARE WBC PRESENT,BOTH PMN AND MONONUCLEAR MODERATE GRAM POSITIVE COCCI FEW GRAM NEGATIVE RODS    Culture   Final    MODERATE PSEUDOMONAS AERUGINOSA FEW STREPTOCOCCUS GROUP G Beta hemolytic streptococci are predictably susceptible to penicillin and other beta lactams. Susceptibility testing not routinely performed. Performed at [facility], [address], [place], [place] [address]    Report Status 12/02/2019 FINAL  Final   Organism ID, Bacteria PSEUDOMONAS AERUGINOSA  Final      Susceptibility   Pseudomonas aeruginosa - MIC*    CEFTAZIDIME >=64 RESISTANT Resistant     CIPROFLOXACIN <=0.25 SENSITIVE Sensitive     GENTAMICIN <=1 SENSITIVE Sensitive     IMIPENEM 2 SENSITIVE Sensitive     * MODERATE PSEUDOMONAS AERUGINOSA  Virus culture     Status: None   Collection Time: 11/27/19  4:50 PM   Specimen: Leg, Left; Wound  Result Value Ref Range Status   Viral Culture Comment  Final    Comment: (NOTE) Preliminary Report: No virus isolated at 24 hours. Next report to follow after 4 days. Performed At: [facility] [address] [place], [place] [id number] Jolene Schimke MD WU:[id number]    Source of Sample WD LEFT [facility]  Final    Comment: Performed at [facility], [address] [place], [place] [address]  Culture, blood (single) w Reflex to ID Panel     Status: None   Collection Time: 11/28/19  1:20 AM   Specimen: BLOOD  Result Value Ref Range Status   Specimen Description BLOOD RIGHT ANTECUBITAL  Final   Special Requests   Final    BOTTLES DRAWN AEROBIC AND ANAEROBIC Blood Culture results may not be optimal due to an  inadequate volume of blood received in culture bottles   Culture   Final    NO GROWTH 5 DAYS Performed at [facility], [address] [place], [place] [address]    Report Status 12/03/2019 FINAL  Final  CULTURE, BLOOD (ROUTINE X 2) w Reflex to ID Panel     Status: None (Preliminary result)   Collection Time: 12/01/19 12:49 AM   Specimen: BLOOD  Result Value Ref Range Status   Specimen Description BLOOD RFOA  Final   Special Requests BOTTLES DRAWN AEROBIC AND ANAEROBIC BCAV  Final   Culture   Final    NO GROWTH 2 DAYS Performed at [facility], [address] [place], [place] [address]    Report Status PENDING  Incomplete  CULTURE, BLOOD (ROUTINE X 2) w Reflex to ID Panel     Status: None (Preliminary result)   Collection Time: 12/01/19  1:05 AM   Specimen: BLOOD  Result Value Ref Range Status   Specimen Description BLOOD LEFT HAND  Final   Special Requests BOTTLES DRAWN AEROBIC AND ANAEROBIC BCAV  Final   Culture   Final    NO GROWTH 2 DAYS Performed at [facility], [address] [place], [place] [address]    Report Status PENDING  Incomplete      Imaging Studies   No results found.   Medications   Scheduled Meds:  amiodarone  400 mg Oral BID   Chlorhexidine Gluconate Cloth  6 each Topical Daily   diclofenac Sodium  2 g Topical QID   furosemide  80 mg Intravenous BID   metoprolol tartrate  75 mg Oral BID   polyethylene glycol  17 g Oral Daily   potassium chloride  40 mEq Oral Daily   senna  2 tablet Oral QHS   sodium chloride flush  10-40 mL Intracatheter Q12H   Continuous Infusions:  sodium chloride Stopped (11/28/19 0255)   sodium chloride 250 mL (11/30/19 2044)    ceFAZolin (ANCEF) IV 2 g (12/04/19 0523)   ciprofloxacin 400 mg (12/03/19 2230)   clindamycin (CLEOCIN) IV 900 mg (12/04/19 0524)   heparin 2,900 Units/hr (12/04/19 0646)   sodium chloride         LOS: 10 days    Time spent: 30 minutes  with > 50% spent in coordination of care and direct patient contact    Pennie Banter, DO [facility]  12/04/2019, 7:42 AM    If 7PM-7AM, please contact night-coverage. How to contact the [place] Attending or Consulting  provider 7A - 7P or covering provider during after hours 7P -7A, for this patient?    4. Check the care team in [facility] and look for a) attending/consulting [facility] provider listed and b) the [facility] team listed 5. Log into [email or web address] and use [place]'s universal password to access. If you do not have the password, please contact the hospital operator. 6. Locate the [facility] provider you are looking for under [facility] and page to a number that you can be directly reached. 7. If you still have difficulty reaching the provider, please page the [facility] (Director on Call) for the Hospitalists listed on amion for assistance.

## 2019-12-04 NOTE — Progress Notes (Signed)
Progress Note  Patient Name: Alfred [name] Date of Encounter: 12/04/2019  [facility] Cardiologist: [name], MD   Subjective   He continues to complain of left leg pain.  He feels that the pressure dressing is too tight in his legs.  He is -20 L for the admission and continues to lose weight.  He is in sinus rhythm    Inpatient Medications    Scheduled Meds: . [START ON 12/06/2019] amiodarone  200 mg Oral BID  . amiodarone  400 mg Oral BID  . Chlorhexidine Gluconate Cloth  6 each Topical Daily  . diclofenac Sodium  2 g Topical QID  . [START ON 12/05/2019] furosemide  60 mg Intravenous BID  . metoprolol tartrate  50 mg Oral BID  . polyethylene glycol  17 g Oral Daily  . potassium chloride  40 mEq Oral Daily  . senna  2 tablet Oral QHS  . sodium chloride flush  10-40 mL Intracatheter Q12H   Continuous Infusions: . sodium chloride Stopped (11/28/19 0255)  . sodium chloride 250 mL (11/30/19 2044)  . [START ON 12/05/2019] ampicillin-sulbactam (UNASYN) IV    . ciprofloxacin 400 mg (12/04/19 0807)  . heparin 2,900 Units/hr (12/04/19 1535)  . sodium chloride     PRN Meds: sodium chloride, acetaminophen **OR** acetaminophen, albuterol, bisacodyl, guaiFENesin, HYDROcodone-acetaminophen, ipratropium-albuterol, melatonin, morphine injection, ondansetron **OR** ondansetron (ZOFRAN) IV, sodium chloride flush   Vital Signs    Vitals:   12/04/19 0438 12/04/19 0812 12/04/19 1142 12/04/19 1605  BP: 128/79 112/80 105/67 103/61  Pulse: 68 67 (!) 59 63  Resp: 18 19 18 18   Temp: 99.3 F (37.4 C) 98.5 F (36.9 C) 98.4 F (36.9 C) 98.4 F (36.9 C)  TempSrc: Oral Oral Oral Oral  SpO2: 100% 96% 99% 97%  Weight:      Height:        Intake/Output Summary (Last 24 hours) at 12/04/2019 1915 Last data filed at 12/04/2019 1809 Gross per 24 hour  Intake 1781.63 ml  Output 5825 ml  Net -4043.37 ml   Last 3 Weights 12/03/2019 12/02/2019 12/01/2019  Weight (lbs) 414 lb 14.5 oz  416 lb 3.7 oz 420 lb  Weight (kg) 188.2 kg 188.8 kg 190.511 kg      Telemetry    Normal sinus rhythm- Personally Reviewed  ECG     - Personally Reviewed  Physical Exam   Constitutional:  oriented to person, place, and time. No distress. Morbidly obese HENT:  Head: Normocephalic and atraumatic.  Eyes:  no discharge. No scleral icterus.  Neck: Normal range of motion. Neck supple. No JVD present.  Cardiovascular: Normal rate, regular rhythm, normal heart sounds and intact distal pulses. Exam reveals no gallop and no friction rub.  1+ pitting edema left lower extremity, wrap in place bilaterally No murmur heard. Pulmonary/Chest: Effort normal and breath sounds normal. No stridor. No respiratory distress.  no wheezes.  no rales.  no tenderness.  Abdominal: Soft.  no distension.  no tenderness.  Musculoskeletal: Normal range of motion.  no  tenderness or deformity.  Neurological:  normal muscle tone. Coordination normal. No atrophy Skin: Skin is warm and dry. No rash noted. not diaphoretic.  Psychiatric:  normal mood and affect. behavior is normal. Thought content normal.    Labs    High Sensitivity Troponin:   Recent Labs  Lab 11/26/19 1811 11/26/19 2000  TROPONINIHS 27* 27*      Chemistry Recent Labs  Lab 12/02/19 0532 12/03/19 12/05/19 12/04/19 0435  NA 134* 132* 135  K 3.7 3.8 4.3  CL 96* 94* 98  CO2 33* 27 30  GLUCOSE 118* 118* 114*  BUN 13 14 17   CREATININE 0.82 0.90 0.92  CALCIUM 7.5* 8.1* 8.3*  GFRNONAA >60 >60 >60  GFRAA >60 >60 >60  ANIONGAP 5 11 7      Hematology Recent Labs  Lab 12/02/19 0532 12/03/19 0614 12/04/19 0435  WBC 12.3* 11.4* 11.8*  RBC 3.94*  3.76* 4.24 4.03*  HGB 8.5* 9.1* 8.7*  HCT 26.8* 29.0* 28.2*  MCV 68.0* 68.4* 70.0*  MCH 21.6* 21.5* 21.6*  MCHC 31.7 31.4 30.9  RDW 16.1* 16.5* 16.5*  PLT 252 245 305    BNPNo results for input(s): BNP, PROBNP in the last 168 hours.   DDimer No results for input(s): DDIMER in the last 168  hours.   Radiology    No results found.  Cardiac Studies   Echo  1. Left ventricular ejection fraction, by estimation, is 50 to 55%. The  left ventricle has low normal function. Left ventricular endocardial  border not optimally defined to evaluate regional wall motion. The left  ventricular internal cavity size was  mildly dilated. There is moderate left ventricular hypertrophy. Left  ventricular diastolic function could not be evaluated.  2. Right ventricular systolic function is normal. The right ventricular  size is normal. Tricuspid regurgitation signal is inadequate for assessing  PA pressure.  3. Left atrial size was mildly dilated.  4. The mitral valve is normal in structure. No evidence of mitral valve  regurgitation. No evidence of mitral stenosis.  5. The aortic valve is normal in structure. Aortic valve regurgitation is  not visualized. No aortic stenosis is present.    Patient Profile     54 y.o. male with history of morbid obesity, tobacco abuse, admitted due to edema found to have atrial flutter on 11/2019.  Spontaneously converted to sinus rhythm on 11/2022.  Assessment & Plan    Atrial flutter, typical Converted to normal sinus rhythm August 24, Continue amiodarone 400 twice daily for 1 more day then down to 200 twice daily Heparin infusion with transition to NOAC at discharge If unable to qualify for NOAC he will need warfarin  Acute on chronic diastolic CHF In the setting of arrhythmia, morbid obesity, suspected sleep apnea Presenting with massive edema, He is negative on 2 L for the admission.  I decrease IV furosemide to 60 mg twice daily and I suspect he can be switched to oral furosemide 40 mg twice daily tomorrow to avoid overdiuresis.  Streptococcus group B bacteremia On broad-spectrum antibiotics, followed by ID Plan for transesophageal echo once euvolemic Currently unable to move in bed, has significant left leg pain, would be unable to lay  on his left side     For questions or updates, please contact [facility] Please consult [email or web address] for contact info under       Signed, 12/2022, MD  12/04/2019, 7:15 PM

## 2019-12-04 NOTE — Progress Notes (Signed)
ANTICOAGULATION CONSULT NOTE  Pharmacy Consult for heparin Indication: atrial flutter  No Known Allergies  Patient Measurements: Height: 6\' 7"  (200.7 cm) Weight: (!) 188.2 kg (414 lb 14.5 oz) IBW/kg (Calculated) : 93.7 Heparin Dosing Weight: 141 kg (heparin adjusted body weight), initially used 113 kg  Vital Signs: Temp: 99.3 F (37.4 C) (08/30 0438) Temp Source: Oral (08/30 0438) BP: 128/79 (08/30 0438) Pulse Rate: 68 (08/30 0438)  Labs: Recent Labs    12/02/19 0532 12/02/19 1507 12/03/19 0614 12/03/19 1227 12/04/19 0435  HGB 8.5*  --  9.1*  --  8.7*  HCT 26.8*  --  29.0*  --  28.2*  PLT 252  --  245  --  305  HEPARINUNFRC 2.20*   < > 0.33 0.38 0.38  CREATININE 0.82  --  0.90  --  0.92   < > = values in this interval not displayed.    Estimated Creatinine Clearance: 172.7 mL/min (by C-G formula based on SCr of 0.92 mg/dL).   Medical History: Past Medical History:  Diagnosis Date  . Lower extremity edema   . Morbid obesity (HCC) 11/27/2019  . Tobacco abuse 11/27/2019    Assessment: 54 year old patient with strep group G bacteremia. In atrial flutter with RVR on amiodarone and diltiazem. Plan for possible cardioversion. Pharmacy to start on heparin drip.  8/28 2303 HL 0.12 - rate increased to 2900 units/hr.  8/29 0614 HL 0.33  8/29 1227 HL 0.38   Goal of Therapy:  Heparin level 0.3-0.7 units/ml Monitor platelets by anticoagulation protocol: Yes   Plan:  08/30 @ 0500 HL 0.38 therapeutic. Will continue current rate and will recheck HL w/ am labs and continue to monitor.   9/30, PharmD, BCPS Clinical Pharmacist 12/04/2019 7:00 AM

## 2019-12-04 NOTE — Progress Notes (Signed)
[facility] INFECTIOUS DISEASE PROGRESS NOTE Date of Admission:  11/24/2019     ID: Alfred [name] is a 54 y.o. male with  Strep bacteremia  Principal Problem:   Streptococcal sepsis, unspecified (HCC) Active Problems:   Cellulitis of left leg   AKI (acute kidney injury) (HCC)   Bacteremia   Lower extremity edema   Morbid obesity (HCC)   Tobacco abuse   Paroxysmal atrial fibrillation (HCC)   Acute on chronic heart failure with preserved ejection fraction (HFpEF) (HCC)   Atrial flutter (HCC)   Subjective: No fevers, WBC down to 11.   ROS  Eleven systems are reviewed and negative except per hpi  Medications:  Antibiotics Given (last 72 hours)    Date/Time Action Medication Dose Rate   12/01/19 2042 New Bag/Given   clindamycin (CLEOCIN) IVPB 900 mg 900 mg 100 mL/hr   12/01/19 2216 New Bag/Given   ceFAZolin (ANCEF) IVPB 2g/100 mL premix 2 g 200 mL/hr   12/01/19 2328 New Bag/Given   ciprofloxacin (CIPRO) IVPB 400 mg 400 mg 200 mL/hr   12/02/19 0455 New Bag/Given   clindamycin (CLEOCIN) IVPB 900 mg 900 mg 100 mL/hr   12/02/19 0536 New Bag/Given   ceFAZolin (ANCEF) IVPB 2g/100 mL premix 2 g 200 mL/hr   12/02/19 1044 New Bag/Given   ciprofloxacin (CIPRO) IVPB 400 mg 400 mg 200 mL/hr   12/02/19 1420 New Bag/Given   clindamycin (CLEOCIN) IVPB 900 mg 900 mg 100 mL/hr   12/02/19 1838 New Bag/Given   ceFAZolin (ANCEF) IVPB 2g/100 mL premix 2 g 200 mL/hr   12/02/19 2216 New Bag/Given   ceFAZolin (ANCEF) IVPB 2g/100 mL premix 2 g 200 mL/hr   12/02/19 2240 New Bag/Given   clindamycin (CLEOCIN) IVPB 900 mg 900 mg 100 mL/hr   12/02/19 2300 New Bag/Given   ciprofloxacin (CIPRO) IVPB 400 mg 400 mg 200 mL/hr   12/03/19 0509 New Bag/Given   clindamycin (CLEOCIN) IVPB 900 mg 900 mg 100 mL/hr   12/03/19 0510 New Bag/Given   ceFAZolin (ANCEF) IVPB 2g/100 mL premix 2 g 200 mL/hr   12/03/19 1037 New Bag/Given   ciprofloxacin (CIPRO) IVPB 400 mg 400 mg 200 mL/hr   12/03/19 1431  New Bag/Given   ceFAZolin (ANCEF) IVPB 2g/100 mL premix 2 g 200 mL/hr   12/03/19 1833 New Bag/Given  [Medication was not available]   clindamycin (CLEOCIN) IVPB 900 mg 900 mg 100 mL/hr   12/03/19 2132 New Bag/Given   ceFAZolin (ANCEF) IVPB 2g/100 mL premix 2 g 200 mL/hr   12/03/19 2133 New Bag/Given   clindamycin (CLEOCIN) IVPB 900 mg 900 mg 100 mL/hr   12/03/19 2230 New Bag/Given   ciprofloxacin (CIPRO) IVPB 400 mg 400 mg 200 mL/hr   12/04/19 0523 New Bag/Given   ceFAZolin (ANCEF) IVPB 2g/100 mL premix 2 g 200 mL/hr   12/04/19 0524 New Bag/Given   clindamycin (CLEOCIN) IVPB 900 mg 900 mg 100 mL/hr   12/04/19 1610 New Bag/Given   ciprofloxacin (CIPRO) IVPB 400 mg 400 mg 200 mL/hr   12/04/19 1201 New Bag/Given   clindamycin (CLEOCIN) IVPB 900 mg 900 mg 100 mL/hr   12/04/19 1509 New Bag/Given   ceFAZolin (ANCEF) IVPB 2g/100 mL premix 2 g 200 mL/hr     . [START ON 12/06/2019] amiodarone  200 mg Oral BID  . amiodarone  400 mg Oral BID  . Chlorhexidine Gluconate Cloth  6 each Topical Daily  . diclofenac Sodium  2 g Topical QID  . furosemide  80  mg Intravenous BID  . metoprolol tartrate  75 mg Oral BID  . polyethylene glycol  17 g Oral Daily  . potassium chloride  40 mEq Oral Daily  . senna  2 tablet Oral QHS  . sodium chloride flush  10-40 mL Intracatheter Q12H    Objective: Vital signs in last 24 hours: Temp:  [98.4 F (36.9 C)-99.3 F (37.4 C)] 98.4 F (36.9 C) (08/30 1605) Pulse Rate:  [59-73] 63 (08/30 1605) Resp:  [16-19] 18 (08/30 1605) BP: (103-141)/(61-80) 103/61 (08/30 1605) SpO2:  [96 %-100 %] 97 % (08/30 1605) No distress until the leg is touched Chest b/l air entry, crepts bases Hss1s2 Abd - obese- soft Extremities bil  edema legs, Wrapped  Lab Results Recent Labs    12/03/19 0614 12/04/19 0435  WBC 11.4* 11.8*  HGB 9.1* 8.7*  HCT 29.0* 28.2*  NA 132* 135  K 3.8 4.3  CL 94* 98  CO2 27 30  BUN 14 17  CREATININE 0.90 0.92    Microbiology: Results  for orders placed or performed during the hospital encounter of 11/24/19  Culture, blood (Routine x 2)     Status: None   Collection Time: 11/24/19  3:14 PM   Specimen: BLOOD  Result Value Ref Range Status   Specimen Description BLOOD BLOOD RIGHT HAND  Final   Special Requests   Final    BOTTLES DRAWN AEROBIC AND ANAEROBIC Blood Culture results may not be optimal due to an inadequate volume of blood received in culture bottles   Culture   Final    NO GROWTH 5 DAYS Performed at [facility], [address] [place], [place] [address]    Report Status 11/29/2019 FINAL  Final  Culture, blood (Routine x 2)     Status: Abnormal   Collection Time: 11/24/19  5:20 PM   Specimen: BLOOD  Result Value Ref Range Status   Specimen Description   Final    BLOOD BLOOD RIGHT ARM Performed at [facility], [address] [place], [place] [address]    Special Requests   Final    BOTTLES DRAWN AEROBIC AND ANAEROBIC Blood Culture adequate volume Performed at [facility], [address] [place], [place] [address]    Culture  Setup Time   Final    Organism ID to follow GRAM POSITIVE COCCI IN BOTH AEROBIC AND ANAEROBIC BOTTLES CRITICAL RESULT CALLED TO, READ BACK BY AND VERIFIED WITH: SCOTT HALL 11/25/19 AT 0530 HS Performed at [facility], [address] [place], [place] [address]    Culture STREPTOCOCCUS GROUP G (A)  Final   Report Status 11/27/2019 FINAL  Final   Organism ID, Bacteria STREPTOCOCCUS GROUP G  Final      Susceptibility   Streptococcus group g - MIC*    CLINDAMYCIN RESISTANT Resistant     AMPICILLIN <=0.25 SENSITIVE Sensitive     ERYTHROMYCIN >=8 RESISTANT Resistant     VANCOMYCIN 0.5 SENSITIVE Sensitive     CEFTRIAXONE <=0.12 SENSITIVE Sensitive     LEVOFLOXACIN 0.5 SENSITIVE Sensitive     * STREPTOCOCCUS GROUP G  Blood Culture ID Panel (Reflexed)     Status: Abnormal   Collection Time: 11/24/19  5:20 PM  Result Value Ref Range Status    Enterococcus faecalis NOT DETECTED NOT DETECTED Final   Enterococcus Faecium NOT DETECTED NOT DETECTED Final   Listeria monocytogenes NOT DETECTED NOT DETECTED Final   Staphylococcus species NOT DETECTED NOT DETECTED Final   Staphylococcus aureus (  BCID) NOT DETECTED NOT DETECTED Final   Staphylococcus epidermidis NOT DETECTED NOT DETECTED Final   Staphylococcus lugdunensis NOT DETECTED NOT DETECTED Final   Streptococcus species DETECTED (A) NOT DETECTED Final    Comment: Not Enterococcus species, Streptococcus agalactiae, Streptococcus pyogenes, or Streptococcus pneumoniae. CRITICAL RESULT CALLED TO, READ BACK BY AND VERIFIED WITH: SCOTT HALL 11/25/19 AT 0530 HS    Streptococcus agalactiae NOT DETECTED NOT DETECTED Final   Streptococcus pneumoniae NOT DETECTED NOT DETECTED Final   Streptococcus pyogenes NOT DETECTED NOT DETECTED Final   A.calcoaceticus-baumannii NOT DETECTED NOT DETECTED Final   Bacteroides fragilis NOT DETECTED NOT DETECTED Final   Enterobacterales NOT DETECTED NOT DETECTED Final   Enterobacter cloacae complex NOT DETECTED NOT DETECTED Final   Escherichia coli NOT DETECTED NOT DETECTED Final   Klebsiella aerogenes NOT DETECTED NOT DETECTED Final   Klebsiella oxytoca NOT DETECTED NOT DETECTED Final   Klebsiella pneumoniae NOT DETECTED NOT DETECTED Final   Proteus species NOT DETECTED NOT DETECTED Final   Salmonella species NOT DETECTED NOT DETECTED Final   Serratia marcescens NOT DETECTED NOT DETECTED Final   Haemophilus influenzae NOT DETECTED NOT DETECTED Final   Neisseria meningitidis NOT DETECTED NOT DETECTED Final   Pseudomonas aeruginosa NOT DETECTED NOT DETECTED Final   Stenotrophomonas maltophilia NOT DETECTED NOT DETECTED Final   Candida albicans NOT DETECTED NOT DETECTED Final   Candida auris NOT DETECTED NOT DETECTED Final   Candida glabrata NOT DETECTED NOT DETECTED Final   Candida krusei NOT DETECTED NOT DETECTED Final   Candida parapsilosis NOT  DETECTED NOT DETECTED Final   Candida tropicalis NOT DETECTED NOT DETECTED Final   Cryptococcus neoformans/gattii NOT DETECTED NOT DETECTED Final    Comment: Performed at [facility], [address] [place], [place] [address]  SARS Coronavirus 2 by RT PCR (hospital order, performed in [facility] hospital lab) Nasopharyngeal Nasopharyngeal Swab     Status: None   Collection Time: 11/24/19  7:40 PM   Specimen: Nasopharyngeal Swab  Result Value Ref Range Status   SARS Coronavirus 2 NEGATIVE NEGATIVE Final    Comment: (NOTE) SARS-CoV-2 target nucleic acids are NOT DETECTED.  The SARS-CoV-2 RNA is generally detectable in upper and lower respiratory specimens during the acute phase of infection. The lowest concentration of SARS-CoV-2 viral copies this assay can detect is 250 copies / mL. A negative result does not preclude SARS-CoV-2 infection and should not be used as the sole basis for treatment or other patient management decisions.  A negative result may occur with improper specimen collection / handling, submission of specimen other than nasopharyngeal swab, presence of viral mutation(s) within the areas targeted by this assay, and inadequate number of viral copies (<250 copies / mL). A negative result must be combined with clinical observations, patient history, and epidemiological information.  Fact Sheet for Patients:   BoilerBrush.com.cyhttps://www.fda.gov/media/136312/download  Fact Sheet for Healthcare Providers: https://pope.com/https://www.fda.gov/media/136313/download  This test is not yet approved or  cleared by the [place] FDA and has been authorized for detection and/or diagnosis of SARS-CoV-2 by FDA under an Emergency Use Authorization (EUA).  This EUA will remain in effect (meaning this test can be used) for the duration of the COVID-19 declaration under Section 564(b)(1) of the Act, 21 U.S.C. section 360bbb-3(b)(1), unless the authorization is terminated or revoked  sooner.  Performed at [facility], [address] [place], [place] [address]   C Difficile Quick Screen w PCR reflex     Status: None   Collection Time:  11/25/19 10:04 AM   Specimen: STOOL  Result Value Ref Range Status   C Diff antigen NEGATIVE NEGATIVE Final   C Diff toxin NEGATIVE NEGATIVE Final   C Diff interpretation No C. difficile detected.  Final    Comment: Performed at [facility], [address] [place], [place] [address]  MRSA PCR Screening     Status: None   Collection Time: 11/25/19  4:20 PM   Specimen: Nasopharyngeal  Result Value Ref Range Status   MRSA by PCR NEGATIVE NEGATIVE Final    Comment:        The GeneXpert MRSA Assay (FDA approved for NASAL specimens only), is one component of a comprehensive MRSA colonization surveillance program. It is not intended to diagnose MRSA infection nor to guide or monitor treatment for MRSA infections. Performed at [facility], [address] [place], [place] [address]   Aerobic Culture (superficial specimen)     Status: None   Collection Time: 11/27/19  4:50 PM   Specimen: Wound  Result Value Ref Range Status   Specimen Description   Final    WOUND Performed at [facility], [address], [place], [place] [address]    Special Requests   Final    LEFT LEG/SHIN Performed at [facility], [address] [place], [place] [address]    Gram Stain   Final    RARE WBC PRESENT,BOTH PMN AND MONONUCLEAR MODERATE GRAM POSITIVE COCCI FEW GRAM NEGATIVE RODS    Culture   Final    MODERATE PSEUDOMONAS AERUGINOSA FEW STREPTOCOCCUS GROUP G Beta hemolytic streptococci are predictably susceptible to penicillin and other beta lactams. Susceptibility testing not routinely performed. Performed at [facility], [address], [place], [place] [address]    Report Status 12/02/2019 FINAL  Final   Organism ID, Bacteria PSEUDOMONAS AERUGINOSA  Final      Susceptibility    Pseudomonas aeruginosa - MIC*    CEFTAZIDIME >=64 RESISTANT Resistant     CIPROFLOXACIN <=0.25 SENSITIVE Sensitive     GENTAMICIN <=1 SENSITIVE Sensitive     IMIPENEM 2 SENSITIVE Sensitive     * MODERATE PSEUDOMONAS AERUGINOSA  Virus culture     Status: None   Collection Time: 11/27/19  4:50 PM   Specimen: Leg, Left; Wound  Result Value Ref Range Status   Viral Culture Comment  Final    Comment: (NOTE) Preliminary Report: No virus isolated at 4 days.  Next report to follow after 7 days. Performed At: [facility] [address] [place], [place] [id number] Jolene Schimke MD [id number]    Source of Sample WD LEFT [facility]  Final    Comment: Performed at [facility], [address] [place], [place] [address]  Culture, blood (single) w Reflex to ID Panel     Status: None   Collection Time: 11/28/19  1:20 AM   Specimen: BLOOD  Result Value Ref Range Status   Specimen Description BLOOD RIGHT ANTECUBITAL  Final   Special Requests   Final    BOTTLES DRAWN AEROBIC AND ANAEROBIC Blood Culture results may not be optimal due to an inadequate volume of blood received in culture bottles   Culture   Final    NO GROWTH 5 DAYS Performed at [facility], [address] [place], [place] [address]    Report Status 12/03/2019 FINAL  Final  CULTURE, BLOOD (ROUTINE X 2) w Reflex to ID Panel     Status: None (Preliminary result)  Collection Time: 12/01/19 12:49 AM   Specimen: BLOOD  Result Value Ref Range Status   Specimen Description BLOOD RFOA  Final   Special Requests BOTTLES DRAWN AEROBIC AND ANAEROBIC BCAV  Final   Culture   Final    NO GROWTH 3 DAYS Performed at [facility], [address] [place], [place] [address]    Report Status PENDING  Incomplete  CULTURE, BLOOD (ROUTINE X 2) w Reflex to ID Panel     Status: None (Preliminary result)   Collection Time: 12/01/19  1:05 AM   Specimen: BLOOD  Result Value Ref Range Status   Specimen  Description BLOOD LEFT HAND  Final   Special Requests BOTTLES DRAWN AEROBIC AND ANAEROBIC BCAV  Final   Culture   Final    NO GROWTH 3 DAYS Performed at [facility], [address], [place], [place] [address]    Report Status PENDING  Incomplete    Studies/Results: No results found.  Assessment/Plan: Streptococcus Group G bacteremia Source unclear- likely  Legs ( left)   need to r/o endocarditis- will need TEE- seen by cardiologist plan is to take him for TEE once CHF better controlled  Cellulitis left leg- with recent fever and leucocytosis Change back unasyn from cefazolin and clinda  Cont cipro for Pseudomonas  Elevate legs on 6 pillows  Bilateral venous edema with punched-out lesions on the right leg and circular lesions on the left leg. This is unusual for cellulitis and need to rule out HSV or erosive lichen planus.  A. fib-reverted to sinus On Amiodarone Watch while on cipro  Thank you very much for the consult. Will follow with you.  Mick Sell   12/04/2019, 4:17 PM

## 2019-12-04 NOTE — Progress Notes (Signed)
   Heart Failure Nurse Navigator Note:  Spoke at length again today with the patient.  He states that he has not worked for 2 years.  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  He does not have any income, has not applied for disability but is interested in doing so.  The cousin that he currently lives with cooks with salt. He goes on to say that he is not pleased about the way she treats him and is considering going back to living on the streets.  When questioned on how active he is during the day he says that due to the pain and burning in his feet and legs he finds it hard to stand for any length of time.  He does not venture outside as he does not have a pair of shoes.  He does not have a scale for  daily weights. Discussed with him the importance of daily weights, documenting and reporting to doctor a 2 to 3 pound weight gain over night or 5 pound within a week.  He voices understanding.  He states today he is not hungry, hardly ate anything off the tray for breakfast.  He states he has not a a good appetite for a long time.  In working with PT he has dangled on the side of the bed and completed leg exercises but has not gotten up in the chair at bedside yet.  Tresa Endo R.N., [facility]

## 2019-12-04 NOTE — Progress Notes (Signed)
Physical Therapy Treatment Patient Details Name: Alfred [name] MRN: [id number] DOB: 1965-04-14 Today's Date: 12/04/2019    History of Present Illness Alfred [name] is a 54yoM who comes to [facility] on [facility] c pain and LEE. PMH: chronic BLE venous statis, hepatits. Pt admitted with BLE cellulitis. Since arrival pt having issues with fever and tachy into 140s-160s. At time of evaluation, pt continues to have significant LEE bilat.    PT Comments    Pt in bed, ready for PM session.  LE's very tender to touch to reposition and don socks.  He is able to lift RLE off bed to assist but unable with LLE.  He slowly moves to EOB with max a x 1 for LE assist and trunk.  Generally steady once sitting.  He is able to stand x 2 with RW and mod a x 2 for safety.  Overall poor stance and heavy reliance on walker.  On second attempt he is able to take 3 very small sidesteps along EOB to reposition but reports dizziness and needing to sit.  Dizziness remains once sitting and he is assisted back to supine with max a x 2.  Once supine he reports dizziness is relieved. He is able to pull up in bed with HOB lowered but a trapeze would be beneficial for pt and unit secretary to arrange.   Follow Up Recommendations  SNF;Supervision for mobility/OOB     Equipment Recommendations  Other (comment) (to be determined)    Recommendations for Other Services OT consult     Precautions / Restrictions Precautions Precautions: Fall Precaution Comments: bilat leg wounds, Left knee pain Restrictions Weight Bearing Restrictions: No    Mobility  Bed Mobility Overal bed mobility: Needs Assistance Bed Mobility: Sit to Supine;Supine to Sit     Supine to sit: Max assist Sit to supine: Max assist   General bed mobility comments: Max A for B LEs and trunk. B LEs very painful with movement  Transfers Overall transfer level: Needs assistance Equipment used: Rolling walker (2 wheeled) Transfers: Sit to/from Stand Sit  to Stand: Max assist;From elevated surface;+2 safety/equipment;+2 physical assistance         General transfer comment: pt required max encouragement to stand from elevated EOB with use of RW and max lifting assistance  Ambulation/Gait Ambulation/Gait assistance: Max assist;+2 physical assistance;+2 safety/equipment Gait Distance (Feet): 2 Feet Assistive device: Rolling walker (2 wheeled) Gait Pattern/deviations: Step-to pattern;Trunk flexed Gait velocity: decreased   General Gait Details: small steps along bed to reposition but limited by dizziness   Stairs             Wheelchair Mobility    Modified Rankin (Stroke Patients Only)       Balance Overall balance assessment: Needs assistance Sitting-balance support: Single extremity supported;Feet supported Sitting balance-Leahy Scale: Good     Standing balance support: During functional activity;Bilateral upper extremity supported Standing balance-Leahy Scale: Poor Standing balance comment: reliance on RW with posterior bias requiring min - mod A for static standing                            Cognition Arousal/Alertness: Awake/alert Behavior During Therapy: WFL for tasks assessed/performed Overall Cognitive Status: Within Functional Limits for tasks assessed  Exercises      General Comments        Pertinent Vitals/Pain Pain Assessment: Faces Faces Pain Scale: Hurts whole lot Pain Location: bilat legs with any touch Pain Descriptors / Indicators: Aching;Discomfort;Grimacing Pain Intervention(s): Limited activity within patient's tolerance;Monitored during session;Repositioned    Home Living                      Prior Function            PT Goals (current goals can now be found in the care plan section) Acute Rehab PT Goals Patient Stated Goal: to move better Progress towards PT goals: Progressing toward goals     Frequency    Min 2X/week      PT Plan Current plan remains appropriate    Co-evaluation              AM-PAC PT "6 Clicks" Mobility   Outcome Measure  Help needed turning from your back to your side while in a flat bed without using bedrails?: Total Help needed moving from lying on your back to sitting on the side of a flat bed without using bedrails?: Total Help needed moving to and from a bed to a chair (including a wheelchair)?: Total Help needed standing up from a chair using your arms (e.g., wheelchair or bedside chair)?: Total Help needed to walk in hospital room?: Total Help needed climbing 3-5 steps with a railing? : Total 6 Click Score: 6    End of Session   Activity Tolerance: Patient limited by fatigue;Patient tolerated treatment well;Patient limited by pain Patient left: in bed;with nursing/sitter in room;with call bell/phone within reach Nurse Communication: Mobility status PT Visit Diagnosis: Difficulty in walking, not elsewhere classified (R26.2);Unsteadiness on feet (R26.81);Muscle weakness (generalized) (M62.81)     Time: 1007-1219 PT Time Calculation (min) (ACUTE ONLY): 27 min  Charges:  $Therapeutic Activity: 23-37 mins                    Danielle Dess, PTA 12/04/19, 1:56 PM

## 2019-12-05 LAB — BASIC METABOLIC PANEL
Anion gap: 11 (ref 5–15)
BUN: 17 mg/dL (ref 6–20)
CO2: 28 mmol/L (ref 22–32)
Calcium: 8.3 mg/dL — ABNORMAL LOW (ref 8.9–10.3)
Chloride: 96 mmol/L — ABNORMAL LOW (ref 98–111)
Creatinine, Ser: 0.87 mg/dL (ref 0.61–1.24)
GFR calc Af Amer: >60 mL/min (ref >60)
GFR calc non Af Amer: >60 mL/min (ref >60)
Glucose, Bld: 110 mg/dL — ABNORMAL HIGH (ref 70–99)
Potassium: 4.4 mmol/L (ref 3.5–5.1)
Sodium: 135 mmol/L (ref 135–145)

## 2019-12-05 LAB — CBC WITH DIFFERENTIAL/PLATELET
Abs Immature Granulocytes: 0.49 10*3/uL — ABNORMAL HIGH (ref 0.00–0.07)
Basophils Absolute: 0 10*3/uL (ref 0.0–0.1)
Basophils Relative: 0 %
Eosinophils Absolute: 0.1 10*3/uL (ref 0.0–0.5)
Eosinophils Relative: 1 %
HCT: 30.2 % — ABNORMAL LOW (ref 39.0–52.0)
Hemoglobin: 9.5 g/dL — ABNORMAL LOW (ref 13.0–17.0)
Immature Granulocytes: 4 %
Lymphocytes Relative: 10 %
Lymphs Abs: 1.1 10*3/uL (ref 0.7–4.0)
MCH: 21.3 pg — ABNORMAL LOW (ref 26.0–34.0)
MCHC: 31.5 g/dL (ref 30.0–36.0)
MCV: 67.9 fL — ABNORMAL LOW (ref 80.0–100.0)
Monocytes Absolute: 0.8 10*3/uL (ref 0.1–1.0)
Monocytes Relative: 7 %
Neutro Abs: 8.7 10*3/uL — ABNORMAL HIGH (ref 1.7–7.7)
Neutrophils Relative %: 78 %
Platelets: 322 10*3/uL (ref 150–400)
RBC: 4.45 MIL/uL (ref 4.22–5.81)
RDW: 16.7 % — ABNORMAL HIGH (ref 11.5–15.5)
Smear Review: NORMAL
WBC: 11.3 10*3/uL — ABNORMAL HIGH (ref 4.0–10.5)
nRBC: 0 % (ref 0.0–0.2)

## 2019-12-05 LAB — HEPARIN LEVEL (UNFRACTIONATED): Heparin Unfractionated: 0.61 IU/mL (ref 0.30–0.70)

## 2019-12-05 MED ORDER — POTASSIUM CHLORIDE CRYS ER 20 MEQ PO TBCR
20.0000 meq | EXTENDED_RELEASE_TABLET | Freq: Every day | ORAL | Status: DC
  Administered 2019-12-06 – 2019-12-11 (×6): 20 meq via ORAL
  Filled 2019-12-05 (×6): qty 1

## 2019-12-05 NOTE — Progress Notes (Signed)
Physical Therapy Treatment Patient Details Name: Alfred [name] MRN: [id number] DOB: 01/09/1966 Today's Date: 12/05/2019    History of Present Illness Alfred [name] is a 54yoM who comes to [facility] on 8/20 c pain and LEE. PMH: chronic BLE venous statis, hepatits. Pt admitted with BLE cellulitis. Since arrival pt having issues with fever and tachy into 140s-160s. At time of evaluation, pt continues to have significant LEE bilat.    PT Comments    Pt was supine in bed with HOB elevated ~ 30 degrees. He agrees to PT session with a little encouragement. Max assist of one to exit L side of bed and achieve EOB short sit. Sat EOB a several minutes prior to stand 3 x from elevated bed height +2 assistance. Pt fatigues quickly and requires prolonged seated rest between standing trials. Pt mostly liked by LLE weakness/pain. He did take steps from FOB to [facility] but requires increased time and heavy reliance on RW + 2 assistance. At conclusion of session, pt was supine in bed with HOB elevated and BLEs supported to promote wound healing. Acute PT will continue current POC and continues to recommend DC to SNF at DC to address strength, balance, and overall safety with mobility. Pt has bed alarm set, call bell in reach, and BLEs elevated wearing bone foam to prevent skin breakdown.      Follow Up Recommendations  SNF     Equipment Recommendations  Other (comment) (defer to next level of care)    Recommendations for Other Services       Precautions / Restrictions Precautions Precautions: Fall Precaution Comments: bilat leg wounds, Left knee pain Restrictions Weight Bearing Restrictions: Yes    Mobility  Bed Mobility Overal bed mobility: Needs Assistance Bed Mobility: Supine to Sit;Sit to Supine     Supine to sit: Max assist Sit to supine: Max assist   General bed mobility comments: Max assist of one to exit L side of bed and return to supine. assisted Upper body to achieve EOB short sit and  then max assist with BLEs to return to supine from EOB.  Transfers Overall transfer level: Needs assistance Equipment used: Rolling walker (2 wheeled) Transfers: Sit to/from Stand Sit to Stand: Max assist;From elevated surface;+2 safety/equipment;+2 physical assistance         General transfer comment: Pt performed STS 3 x EOB throughout session with +2 assist for safety and to achieve full upright standing. LLE weaker than RLE. Able to static stand and perform LLE marching but unable to perform RLE marching 2/2 to LLE pain/weakness.  Ambulation/Gait Ambulation/Gait assistance: Max assist;+2 physical assistance;+2 safety/equipment Gait Distance (Feet): 3 Feet Assistive device: Rolling walker (2 wheeled) Gait Pattern/deviations: Step-to pattern;Trunk flexed Gait velocity: decreased   General Gait Details: pt was able to take ~ 3-4 side steps form FOB to [facility]. incresaed time to perform. per rehab tech, pt much improved safety and abilities this date versus previous date.   Stairs             Wheelchair Mobility    Modified Rankin (Stroke Patients Only)       Balance Overall balance assessment: Needs assistance Sitting-balance support: Bilateral upper extremity supported;No upper extremity supported Sitting balance-Leahy Scale: Good Sitting balance - Comments: no LOB in sitting   Standing balance support: During functional activity;Bilateral upper extremity supported Standing balance-Leahy Scale: Fair Standing balance comment: heavy reliance on BUE on RW however pt was steady on feet once in standing.  Cognition Arousal/Alertness: Awake/alert Behavior During Therapy: WFL for tasks assessed/performed Overall Cognitive Status: Within Functional Limits for tasks assessed                                 General Comments: Pt A and O x 4. agrees to PT with a little encouragement. reports imporved pain control but still  has LLE pain with movements and wt bearing      Exercises      General Comments        Pertinent Vitals/Pain Pain Assessment: 0-10 Pain Score: 6  Faces Pain Scale: Hurts little more Pain Location: LLE with movements Pain Descriptors / Indicators: Aching;Discomfort;Grimacing Pain Intervention(s): Limited activity within patient's tolerance;Monitored during session;Repositioned;Ice applied    Home Living                      Prior Function            PT Goals (current goals can now be found in the care plan section) Acute Rehab PT Goals Patient Stated Goal: to move better Progress towards PT goals: Progressing toward goals    Frequency    Min 2X/week      PT Plan Current plan remains appropriate    Co-evaluation              AM-PAC PT "6 Clicks" Mobility   Outcome Measure  Help needed turning from your back to your side while in a flat bed without using bedrails?: A Lot Help needed moving from lying on your back to sitting on the side of a flat bed without using bedrails?: A Lot Help needed moving to and from a bed to a chair (including a wheelchair)?: A Lot Help needed standing up from a chair using your arms (e.g., wheelchair or bedside chair)?: A Lot Help needed to walk in hospital room?: A Lot Help needed climbing 3-5 steps with a railing? : Total 6 Click Score: 11    End of Session Equipment Utilized During Treatment: Gait belt Activity Tolerance: Patient limited by fatigue;Patient tolerated treatment well;Patient limited by pain Patient left: in bed;with nursing/sitter in room;with call bell/phone within reach Nurse Communication: Mobility status PT Visit Diagnosis: Difficulty in walking, not elsewhere classified (R26.2);Unsteadiness on feet (R26.81);Muscle weakness (generalized) (M62.81)     Time: 4854-6270 PT Time Calculation (min) (ACUTE ONLY): 27 min  Charges:  $Therapeutic Activity: 23-37 mins                     Jetta Lout PTA 12/05/19, 12:52 PM

## 2019-12-05 NOTE — Consult Note (Signed)
WOC consulted for LE wounds, see consult performed this admission per L. McNichol CWOCN, orders in the computer for care for LE wounds.   Will not consult for this reason.  Thomasa Heidler [facility], CNS, [employer] [phone]

## 2019-12-05 NOTE — Progress Notes (Signed)
PROGRESS NOTE    Arlee Muslimhomas Junior Linnen   YQM:[id number]  DOB: Aug 12, 1965  PCP: Patient, No Pcp Per    DOA: 11/24/2019 LOS: 11   Brief Narrative   [name] is a 54 y.o. male with history of chronic lower extremity venous stasis for the past 15 years but otherwise no medical history and on no medications who presented to the ED on 11/24/19 with 2-week history of progressively worsening bilateral lower extremity swelling, left greater than right associated with pain and skin breakdown with oozing. Reported subjective fevers.  In the ED, low-grade temperature of 99, tachycardic at 122 bpm, with normal blood pressure.  WBC normal at 9100, lactic acid borderline at 1.9.  Blood work showing creatinine of 1.27 above baseline of 1.16 but with prior baseline around 0.7.  Urinalysis showing proteinuria.  Chest x-ray showed no active disease.  Bilateral lower extremity Doppler negative for DVT bilaterally, but was suboptimal study due to body habitus and edema.    Admitted to hospitalist service.  Blood cultures positive for group G strep.  Being treated with IV antibiotics for cellulitis and bacteremia.       Assessment & Plan   Principal Problem:   Streptococcal sepsis, unspecified (HCC) Active Problems:   Cellulitis of left leg   AKI (acute kidney injury) (HCC)   Bacteremia   Lower extremity edema   Morbid obesity (HCC)   Tobacco abuse   Paroxysmal atrial fibrillation (HCC)   Acute on chronic heart failure with preserved ejection fraction (HFpEF) (HCC)   Atrial flutter (HCC)   Acute on chronic HFpEF - presented fluid overloaded, massive edema of BLE's up to proximal thighs and abdomen.  Improved with IV diuresis which is ongoing.  [facility] following.  Lasix reduced to 60 mg IV BID.  Monitor I/O's, daily weights, renal function and electrolytes.  Low sodium diet.    Net IO Since Admission: -22,251.17 mL [12/05/19 0809]   Intake/Output Summary (Last 24 hours) at  12/02/2019 1541 Last data filed at 12/02/2019 1345 Gross per 24 hour  Intake 1316 ml  Output 4200 ml  Net -2884 ml    Sepsis secondary to group G Strep bacteremia - sepsis POA as evidenced by fever, tachycardia, and known source(s) of infection.  ID is following.  Continue Unasyn..  Monitor CBC.  Will need TEE to rule out endocarditis (cardiology wants him euvolemic beforehand).  Repeat blood cultures negative to date, follow.     Left lower extremity edema and weakness / Chronic Lower Extremity Venous Stasis -  Diuresis has greatly improved edema of the RLE, but LLE much slower to improve, concerning for DVT.   Venous dopplers on admission were unable to exclude DVT due to poor study quality secondary to patient's body habitus.   CT Venogram obtained 8/30 - showed left inguinal lymphadenopathy (likely reactive from infection and chronic venous stasis, wounds), no evidence of DVT. --Wound care / wraps  Dizziness - on standing with PT.  Will check orthostatics.  Cellulitis of left leg - POA, due to severe venous stasis.  Antibiotic as above.  Wound care.  PRN analgesics.  Generalized weakness - patient has not been out of bed or attempting any mobility.  Prior to admission was ambulatory.  PT recommends SNF, but patient is uninsured.  Out of bed to chair at least BID.  Sit up edge of bed.  Encourage as much mobility as possible.  Reposition every 2 hours.    AKI (acute kidney injury) -  POA, resolved.  Monitor BMP.  Lower extremity edema and chronic venous stasis.  Wound care.  Monitor.  PT recommends SNF. Venous dopplers of lower extremities on admission were unable to exclude DVT, suboptimal study due to body habitus.  Atrial flutter - Currently sinus rhythm.  CHA2DS2-VASc of 1.  Cardiology following.  Continue heparin gtt, transition to DOAC on discharge.  Continue PO Amiodarone.  Continue Lopressor 50 mg PO BID.    Microcytic anemia - mild on admission, progressive.  Hbg trend from  admission forward: 12.9>>10.9>>10.2>>...8.9 today.   Anemia panel with AM labs.  Check FOBT.  Monitor CBC.  Hepatitis C - will need GI follow up outpatient to discuss treatment.  Viral load > 2.75million.   Morbid obesity - Body mass index is 44.7 kg/m. Complicates overall care and prognosis.  Tobacco abuse - counseled on cessation.    DVT prophylaxis: on heparin gtt    Diet:  Diet Orders (From admission, onward)    Start     Ordered   11/28/19 0756  Diet heart healthy/carb modified Room service appropriate? Yes; Fluid consistency: Thin  Diet effective now       Question Answer Comment  Diet-HS Snack? Nothing   Room service appropriate? Yes   Fluid consistency: Thin      11/28/19 0755            Code Status: Full Code    Subjective 12/05/19    Patient seen this AM at bedside.  Says he was up out of bed with therapy yesterday, but declined to go sit up in the chair.  Reports feeling well today.  Left leg somewhat improved.    Disposition Plan & Communication   Status is: Inpatient  Remains inpatient appropriate because:IV treatments appropriate due to intensity of illness or inability to take PO.   Ongoing IV diuresis and IV antibiotics for bacteremia.   Dispo: The patient is from: Home              Anticipated d/c is to: Home              Anticipated d/c date is: 2-3 days              Patient currently is not medically stable to d/c.    Family Communication: none at bedside, will attempt to call   Consults, Procedures, Significant Events   Consultants:   Cardiology  Infectious disease  Wound care RN  Procedures:   None  Antimicrobials:   Unasyn    Objective   Vitals:   12/04/19 1605 12/04/19 2051 12/05/19 0424 12/05/19 0435  BP: 103/61 127/64  128/79  Pulse: 63 70  66  Resp: 18 20  20   Temp: 98.4 F (36.9 C) 98.1 F (36.7 C)  98.1 F (36.7 C)  TempSrc: Oral Oral    SpO2: 97% 99%  99%  Weight:   (!) 180 kg   Height:         Intake/Output Summary (Last 24 hours) at 12/05/2019 0809 Last data filed at 12/05/2019 0007 Gross per 24 hour  Intake 1781.63 ml  Output 5325 ml  Net -3543.37 ml   Filed Weights   12/02/19 0539 12/03/19 0448 12/05/19 0424  Weight: (!) 188.8 kg (!) 188.2 kg (!) 180 kg    Physical Exam:  General exam: awake, alert, no acute distress, obese Respiratory system: CTAB but diminished due to body habitus, normal respiratory effort. On room air. Cardiovascular system: normal S1/S2, RRR, 2/6 systolic murmur,  RLE edema resolved, LLE edema improved today with marked skin wrinkling, still with some pitting edema. Central nervous system: A&O x3. no gross focal neurologic deficits, normal speech Extremities: b/l LE's wrapped and offloading boots in place, edema as above.   Psychiatry: normal mood, congruent affect  Labs   Data Reviewed: I have personally reviewed following labs and imaging studies  CBC: Recent Labs  Lab 11/30/19 0412 11/30/19 0412 12/01/19 0059 12/02/19 0532 12/03/19 0614 12/04/19 0435 12/05/19 0407  WBC 14.7*   < > 14.1* 12.3* 11.4* 11.8* 11.3*  NEUTROABS 11.4*  --   --  9.8* 9.0* 9.2* 8.7*  HGB 9.5*   < > 8.9* 8.5* 9.1* 8.7* 9.5*  HCT 30.5*   < > 27.8* 26.8* 29.0* 28.2* 30.2*  MCV 68.5*   < > 68.0* 68.0* 68.4* 70.0* 67.9*  PLT 217   < > 223 252 245 305 322   < > = values in this interval not displayed.   Basic Metabolic Panel: Recent Labs  Lab 12/01/19 0059 12/02/19 0532 12/03/19 0614 12/04/19 0435 12/05/19 0407  NA 136 134* 132* 135 135  K 4.0 3.7 3.8 4.3 4.4  CL 96* 96* 94* 98 96*  CO2 29 33* GLUCOSE 117* 118* 118* 114* 110*  BUN CREATININE 0.96 0.82 0.90 0.92 0.87  CALCIUM 7.8* 7.5* 8.1* 8.3* 8.3*  MG 1.9  --  2.1  --   --    GFR: Estimated Creatinine Clearance: 178.1 mL/min (by C-G formula based on SCr of 0.87 mg/dL). Liver Function Tests: No results for input(s): AST, ALT, ALKPHOS, BILITOT, PROT, ALBUMIN in the last  168 hours. No results for input(s): LIPASE, AMYLASE in the last 168 hours. No results for input(s): AMMONIA in the last 168 hours. Coagulation Profile: No results for input(s): INR, PROTIME in the last 168 hours. Cardiac Enzymes: No results for input(s): CKTOTAL, CKMB, CKMBINDEX, TROPONINI in the last 168 hours. BNP (last 3 results) No results for input(s): PROBNP in the last 8760 hours. HbA1C: No results for input(s): HGBA1C in the last 72 hours. CBG: No results for input(s): GLUCAP in the last 168 hours. Lipid Profile: No results for input(s): CHOL, HDL, LDLCALC, TRIG, CHOLHDL, LDLDIRECT in the last 72 hours. Thyroid Function Tests: No results for input(s): TSH, T4TOTAL, FREET4, T3FREE, THYROIDAB in the last 72 hours. Anemia Panel: No results for input(s): VITAMINB12, FOLATE, FERRITIN, TIBC, IRON, RETICCTPCT in the last 72 hours. Sepsis Labs: No results for input(s): PROCALCITON, LATICACIDVEN in the last 168 hours.  Recent Results (from the past 240 hour(s))  C Difficile Quick Screen w PCR reflex     Status: None   Collection Time: 11/25/19 10:04 AM   Specimen: STOOL  Result Value Ref Range Status   C Diff antigen NEGATIVE NEGATIVE Final   C Diff toxin NEGATIVE NEGATIVE Final   C Diff interpretation No C. difficile detected.  Final    Comment: Performed at [facility], [address] [place], [place] [address]  MRSA PCR Screening     Status: None   Collection Time: 11/25/19  4:20 PM   Specimen: Nasopharyngeal  Result Value Ref Range Status   MRSA by PCR NEGATIVE NEGATIVE Final    Comment:        The GeneXpert MRSA Assay (FDA approved for NASAL specimens only), is one component of a comprehensive MRSA colonization surveillance program. It is not intended to diagnose MRSA infection nor to guide or  monitor treatment for MRSA infections. Performed at [facility], [address] [place], [place] [address]   Aerobic Culture (superficial specimen)      Status: None   Collection Time: 11/27/19  4:50 PM   Specimen: Wound  Result Value Ref Range Status   Specimen Description   Final    WOUND Performed at [facility], [address], [place], [place] [address]    Special Requests   Final    LEFT LEG/SHIN Performed at [facility], [address] [place], [place] [address]    Gram Stain   Final    RARE WBC PRESENT,BOTH PMN AND MONONUCLEAR MODERATE GRAM POSITIVE COCCI FEW GRAM NEGATIVE RODS    Culture   Final    MODERATE PSEUDOMONAS AERUGINOSA FEW STREPTOCOCCUS GROUP G Beta hemolytic streptococci are predictably susceptible to penicillin and other beta lactams. Susceptibility testing not routinely performed. Performed at [facility], [address], [place], [place] [address]    Report Status 12/02/2019 FINAL  Final   Organism ID, Bacteria PSEUDOMONAS AERUGINOSA  Final      Susceptibility   Pseudomonas aeruginosa - MIC*    CEFTAZIDIME >=64 RESISTANT Resistant     CIPROFLOXACIN <=0.25 SENSITIVE Sensitive     GENTAMICIN <=1 SENSITIVE Sensitive     IMIPENEM 2 SENSITIVE Sensitive     * MODERATE PSEUDOMONAS AERUGINOSA  Virus culture     Status: None   Collection Time: 11/27/19  4:50 PM   Specimen: Leg, Left; Wound  Result Value Ref Range Status   Viral Culture Comment  Final    Comment: (NOTE) Preliminary Report: No virus isolated at 4 days.  Next report to follow after 7 days. Performed At: [facility] [address] [place], [place] [id number] Jolene Schimke MD BZ:[id number]    Source of Sample WD LEFT [facility]  Final    Comment: Performed at [facility], [address] [place], [place] [address]  Culture, blood (single) w Reflex to ID Panel     Status: None   Collection Time: 11/28/19  1:20 AM   Specimen: BLOOD  Result Value Ref Range Status   Specimen Description BLOOD RIGHT ANTECUBITAL  Final   Special Requests   Final    BOTTLES DRAWN AEROBIC AND ANAEROBIC Blood Culture  results may not be optimal due to an inadequate volume of blood received in culture bottles   Culture   Final    NO GROWTH 5 DAYS Performed at [facility], [address] [place], [place] [address]    Report Status 12/03/2019 FINAL  Final  CULTURE, BLOOD (ROUTINE X 2) w Reflex to ID Panel     Status: None (Preliminary result)   Collection Time: 12/01/19 12:49 AM   Specimen: BLOOD  Result Value Ref Range Status   Specimen Description BLOOD RFOA  Final   Special Requests BOTTLES DRAWN AEROBIC AND ANAEROBIC BCAV  Final   Culture   Final    NO GROWTH 4 DAYS Performed at [facility], [address], [place], [place] [address]    Report Status PENDING  Incomplete  CULTURE, BLOOD (ROUTINE X 2) w Reflex to ID Panel     Status: None (Preliminary result)   Collection Time: 12/01/19  1:05 AM   Specimen: BLOOD  Result Value Ref Range Status   Specimen Description BLOOD LEFT HAND  Final   Special Requests BOTTLES DRAWN AEROBIC AND ANAEROBIC BCAV  Final   Culture   Final  NO GROWTH 4 DAYS Performed at [facility], [address], [place], [place] [address]    Report Status PENDING  Incomplete      Imaging Studies   CT VENOGRAM ABD/PEL  Result Date: 12/05/2019 CLINICAL DATA:  Inguinal and pelvic lymphadenopathy. Chronic lower extremity venous stasis. EXAM: CT VENOGRAM OF THE ABDOMEN AND PELVIS TECHNIQUE: Multidetector CT imaging of the abdomen and pelvis was performed using the standard venogram protocol following bolus administration of intravenous contrast. CONTRAST:  OMNIPAQUE IOHEXOL 350 MG/ML SOLN COMPARISON:  Chest CTA 11/26/2019 FINDINGS: Lower chest: Lung bases are clear. Hepatobiliary: Normal appearance of the liver, gallbladder and portal venous system. No biliary dilatation. Pancreas: Unremarkable. No pancreatic ductal dilatation or surrounding inflammatory changes. Spleen: Normal in size without focal abnormality. Adrenals/Urinary Tract: Mild  fullness of the left adrenal gland medial limb. Otherwise, the adrenal glands are unremarkable. Normal appearance of both kidneys without hydronephrosis. No suspicious renal lesion. Normal appearance of the urinary bladder. Stomach/Bowel: Stomach is within normal limits. Appendix appears normal. No evidence of bowel wall thickening, distention, or inflammatory changes. Vascular/Lymphatic: Atherosclerotic calcifications in the abdominal aorta without aneurysm. Portal venous system is patent. Main hepatic veins are patent. IVC and renal veins are patent. Bilateral iliac veins are widely patent without thrombus or compression. Proximal femoral veins appear to be patent. Enlarged inguinal lymph nodes, left side greater than right. Mild stranding or edema around left inguinal lymph nodes. Index lymph node in the left inguinal region measures 2.5 cm in the short axis on sequence 5, image 110. Mildly prominent lymph nodes along the left pelvic sidewall on sequence 5 image 85. Mildly prominent iliac lymph nodes. Reproductive: Prostate is unremarkable. Other: Negative for ascites. Ventral hernia containing fat. Minimal stranding within the herniating fat. Musculoskeletal: No acute bone abnormality. Subcutaneous edema in the left upper thigh. IMPRESSION: 1. Normal appearance of the venous structures in the abdomen and pelvis without compression, obstruction or thrombus. 2. Enlarged inguinal and pelvic lymph nodes, left side greater than right. In addition, there is subcutaneous edema in the left upper thigh. Lymphadenopathy could be reactive change from the chronic edema but this lymph node enlargement is nonspecific. If there is concern for a neoplastic process, the left inguinal lymph nodes would be amendable to biopsy. 3.  Aortic Atherosclerosis (ICD10-I70.0). 4. Umbilical hernia containing fat. Minimal stranding associated with this umbilical hernia. Electronically Signed   By: Richarda Overlie M.D.   On: 12/05/2019 07:59      Medications   Scheduled Meds:  [START ON 12/06/2019] amiodarone  200 mg Oral BID   [COMPLETED] amiodarone  400 mg Oral BID   Chlorhexidine Gluconate Cloth  6 each Topical Daily   diclofenac Sodium  2 g Topical QID   furosemide  60 mg Intravenous BID   metoprolol tartrate  50 mg Oral BID   polyethylene glycol  17 g Oral Daily   potassium chloride  40 mEq Oral Daily   senna  2 tablet Oral QHS   sodium chloride flush  10-40 mL Intracatheter Q12H   Continuous Infusions:  sodium chloride 250 mL (11/30/19 2044)   ampicillin-sulbactam (UNASYN) IV 3 g (12/05/19 0522)   ciprofloxacin 400 mg (12/05/19 0749)   heparin 2,900 Units/hr (12/05/19 0109)   sodium chloride         LOS: 11 days    Time spent: 25 minutes with > 50% spent in coordination of care and direct patient contact.    Pennie Banter, DO [facility]  12/05/2019, 8:09 AM    If 7PM-7AM, please contact night-coverage. How to contact the [facility] Attending or Consulting provider 7A - 7P or covering provider during after hours 7P -7A, for this patient?    1. Check the care team in [facility] and look for a) attending/consulting [facility] provider listed and b) the [facility] team listed 2. Log into [email or web address] and use [facility] universal password to access. If you do not have the password, please contact the hospital operator. 3. Locate the [facility] provider you are looking for under [facility] and page to a number that you can be directly reached. If you still have difficulty reaching the provider, please page the [facility] (Director on Call) for the Hospitalists listed on amion for assistance. 4.

## 2019-12-05 NOTE — Progress Notes (Signed)
ANTICOAGULATION CONSULT NOTE  Pharmacy Consult for heparin Indication: atrial flutter  No Known Allergies  Patient Measurements: Height: 6\' 7"  (200.7 cm) Weight: (!) 180 kg (396 lb 13.3 oz) IBW/kg (Calculated) : 93.7 Heparin Dosing Weight: 141 kg (heparin adjusted body weight), initially used 113 kg  Vital Signs: Temp: 98.1 F (36.7 C) (08/31 0435) Temp Source: Oral (08/30 2051) BP: 128/79 (08/31 0435) Pulse Rate: 66 (08/31 0435)  Labs: Recent Labs    12/03/19 0614 12/03/19 0614 12/03/19 1227 12/04/19 0435 12/05/19 0407  HGB 9.1*  --   --  8.7*  --   HCT 29.0*  --   --  28.2*  --   PLT 245  --   --  305  --   HEPARINUNFRC 0.33   < > 0.38 0.38 0.61  CREATININE 0.90  --   --  0.92 0.87   < > = values in this interval not displayed.    Estimated Creatinine Clearance: 178.1 mL/min (by C-G formula based on SCr of 0.87 mg/dL).   Medical History: Past Medical History:  Diagnosis Date  . Lower extremity edema   . Morbid obesity (HCC) 11/27/2019  . Tobacco abuse 11/27/2019    Assessment: 54 year old patient with strep group G bacteremia. In atrial flutter with RVR on amiodarone and diltiazem. Plan for possible cardioversion. Pharmacy to start on heparin drip.  8/28 2303 HL 0.12 - rate increased to 2900 units/hr.  8/29 0614 HL 0.33  8/29 1227 HL 0.38  8/31 0407 HL 0.61, therapeutic  Goal of Therapy:  Heparin level 0.3-0.7 units/ml Monitor platelets by anticoagulation protocol: Yes   Plan:  08/31 @ 0407 HL 0.61 therapeutic. Will continue current rate and will recheck HL w/ am labs and continue to monitor.   9/31, PharmD Clinical Pharmacist 12/05/2019 5:57 AM

## 2019-12-05 NOTE — Progress Notes (Signed)
Progress Note  Patient Name: Alfred [name] Date of Encounter: 12/05/2019  [facility] Cardiologist: [name], MD   Subjective   Reports that he started to move more in the past day or 2, getting out of bed, sitting in the chair " Feels good" Still with significant left leg discomfort on any palpation,  Reports swelling is improving, still not at baseline Abdomen mildly distended  Inpatient Medications    Scheduled Meds: . [START ON 12/06/2019] amiodarone  200 mg Oral BID  . Chlorhexidine Gluconate Cloth  6 each Topical Daily  . diclofenac Sodium  2 g Topical QID  . furosemide  60 mg Intravenous BID  . metoprolol tartrate  50 mg Oral BID  . polyethylene glycol  17 g Oral Daily  . potassium chloride  40 mEq Oral Daily  . senna  2 tablet Oral QHS  . sodium chloride flush  10-40 mL Intracatheter Q12H   Continuous Infusions: . sodium chloride 250 mL (11/30/19 2044)  . ampicillin-sulbactam (UNASYN) IV 3 g (12/05/19 0522)  . ciprofloxacin 400 mg (12/05/19 0749)  . heparin 2,900 Units/hr (12/05/19 0109)  . sodium chloride     PRN Meds: sodium chloride, acetaminophen **OR** acetaminophen, albuterol, bisacodyl, guaiFENesin, HYDROcodone-acetaminophen, ipratropium-albuterol, melatonin, morphine injection, ondansetron **OR** ondansetron (ZOFRAN) IV, sodium chloride flush   Vital Signs    Vitals:   12/04/19 2051 12/05/19 0424 12/05/19 0435 12/05/19 0830  BP: 127/64  128/79 116/64  Pulse: 70  66 63  Resp: 20  20 18   Temp: 98.1 F (36.7 C)  98.1 F (36.7 C) 98.4 F (36.9 C)  TempSrc: Oral   Oral  SpO2: 99%  99% 94%  Weight:  (!) 180 kg    Height:        Intake/Output Summary (Last 24 hours) at 12/05/2019 1142 Last data filed at 12/05/2019 0950 Gross per 24 hour  Intake 1781.63 ml  Output 4500 ml  Net -2718.37 ml   Last 3 Weights 12/05/2019 12/03/2019 12/02/2019  Weight (lbs) 396 lb 13.3 oz 414 lb 14.5 oz 416 lb 3.7 oz  Weight (kg) 180 kg 188.2 kg 188.8 kg        Telemetry    Normal sinus rhythm- Personally Reviewed  ECG     - Personally Reviewed  Physical Exam   Constitutional:  oriented to person, place, and time. No distress.  Morbidly obese HENT:  Head: Grossly normal Eyes:  no discharge. No scleral icterus.  Neck: No JVD, no carotid bruits  Cardiovascular: Regular rate and rhythm, no murmurs appreciated 1+ pitting edema left lower extremity, trace on the right, Legs and wraps below the knees bilaterally Pulmonary/Chest: Clear to auscultation bilaterally, no wheezes or rails Abdominal: Soft.  no distension.  no tenderness.  Musculoskeletal: Normal range of motion, unable to perform full exam  neurological:  normal muscle tone. Coordination normal. No atrophy Skin: Skin warm and dry Psychiatric: normal affect, pleasant   Labs    High Sensitivity Troponin:   Recent Labs  Lab 11/26/19 1811 11/26/19 2000  TROPONINIHS 27* 27*      Chemistry Recent Labs  Lab 12/03/19 0614 12/04/19 0435 12/05/19 0407  NA 132* 135 135  K 3.8 4.3 4.4  CL 94* 98 96*  CO2 27 30 28   GLUCOSE 118* 114* 110*  BUN 14 17 17   CREATININE 0.90 0.92 0.87  CALCIUM 8.1* 8.3* 8.3*  GFRNONAA >60 >60 >60  GFRAA >60 >60 >60  ANIONGAP 11 7 11  Hematology Recent Labs  Lab 12/03/19 0614 12/04/19 0435 12/05/19 0407  WBC 11.4* 11.8* 11.3*  RBC 4.24 4.03* 4.45  HGB 9.1* 8.7* 9.5*  HCT 29.0* 28.2* 30.2*  MCV 68.4* 70.0* 67.9*  MCH 21.5* 21.6* 21.3*  MCHC 31.4 30.9 31.5  RDW 16.5* 16.5* 16.7*  PLT 245 305 322    BNPNo results for input(s): BNP, PROBNP in the last 168 hours.   DDimer No results for input(s): DDIMER in the last 168 hours.   Radiology    CT VENOGRAM ABD/PEL  Result Date: 12/05/2019 CLINICAL DATA:  Inguinal and pelvic lymphadenopathy. Chronic lower extremity venous stasis. EXAM: CT VENOGRAM OF THE ABDOMEN AND PELVIS TECHNIQUE: Multidetector CT imaging of the abdomen and pelvis was performed using the standard venogram  protocol following bolus administration of intravenous contrast. CONTRAST:  OMNIPAQUE IOHEXOL 350 MG/ML SOLN COMPARISON:  Chest CTA 11/26/2019 FINDINGS: Lower chest: Lung bases are clear. Hepatobiliary: Normal appearance of the liver, gallbladder and portal venous system. No biliary dilatation. Pancreas: Unremarkable. No pancreatic ductal dilatation or surrounding inflammatory changes. Spleen: Normal in size without focal abnormality. Adrenals/Urinary Tract: Mild fullness of the left adrenal gland medial limb. Otherwise, the adrenal glands are unremarkable. Normal appearance of both kidneys without hydronephrosis. No suspicious renal lesion. Normal appearance of the urinary bladder. Stomach/Bowel: Stomach is within normal limits. Appendix appears normal. No evidence of bowel wall thickening, distention, or inflammatory changes. Vascular/Lymphatic: Atherosclerotic calcifications in the abdominal aorta without aneurysm. Portal venous system is patent. Main hepatic veins are patent. IVC and renal veins are patent. Bilateral iliac veins are widely patent without thrombus or compression. Proximal femoral veins appear to be patent. Enlarged inguinal lymph nodes, left side greater than right. Mild stranding or edema around left inguinal lymph nodes. Index lymph node in the left inguinal region measures 2.5 cm in the short axis on sequence 5, image 110. Mildly prominent lymph nodes along the left pelvic sidewall on sequence 5 image 85. Mildly prominent iliac lymph nodes. Reproductive: Prostate is unremarkable. Other: Negative for ascites. Ventral hernia containing fat. Minimal stranding within the herniating fat. Musculoskeletal: No acute bone abnormality. Subcutaneous edema in the left upper thigh. IMPRESSION: 1. Normal appearance of the venous structures in the abdomen and pelvis without compression, obstruction or thrombus. 2. Enlarged inguinal and pelvic lymph nodes, left side greater than right. In addition,  there is subcutaneous edema in the left upper thigh. Lymphadenopathy could be reactive change from the chronic edema but this lymph node enlargement is nonspecific. If there is concern for a neoplastic process, the left inguinal lymph nodes would be amendable to biopsy. 3.  Aortic Atherosclerosis (ICD10-I70.0). 4. Umbilical hernia containing fat. Minimal stranding associated with this umbilical hernia. Electronically Signed   By: Richarda Overlie M.D.   On: 12/05/2019 07:59    Cardiac Studies   Echo  1. Left ventricular ejection fraction, by estimation, is 50 to 55%. The  left ventricle has low normal function. Left ventricular endocardial  border not optimally defined to evaluate regional wall motion. The left  ventricular internal cavity size was  mildly dilated. There is moderate left ventricular hypertrophy. Left  ventricular diastolic function could not be evaluated.  2. Right ventricular systolic function is normal. The right ventricular  size is normal. Tricuspid regurgitation signal is inadequate for assessing  PA pressure.  3. Left atrial size was mildly dilated.  4. The mitral valve is normal in structure. No evidence of mitral valve  regurgitation. No evidence of mitral stenosis.  5. The aortic valve is normal in structure. Aortic valve regurgitation is  not visualized. No aortic stenosis is present.    Patient Profile     54 y.o. male with history of morbid obesity, tobacco abuse, admitted due to edema found to have atrial flutter on 11/2019.  Spontaneously converted to sinus rhythm on 11/2022.  Assessment & Plan    Atrial flutter, typical Converted to normal sinus rhythm August 24, Amiodarone 400 mg twice daily today down to 200 mg tomorrow oral dosing --Need assistance from case manager to determine best anticoagulant, whether he will qualify for Eliquis through medical management or if we need warfarin  Acute on chronic diastolic CHF In the setting of arrhythmia, morbid  obesity, suspected sleep apnea Presenting with massive edema, Still appears to have extra fluid, -22.7 L this admission --- Would continue Lasix IV, especially in light of  normal renal function.  Would continue IV until any climbing BUN/creatinine at d/c would switch over to torsemide 40 mg daily, extra 40 mg in PM for 3 pound weight gain  Streptococcus group B bacteremia On broad-spectrum antibiotics, followed by ID Plan for transesophageal echo once euvolemic Still tremendous pain left leg with any motion, touching.  If symptoms persist may need to scan leg , rule out abscess or DVT -We will hope to complete TEE as he is able to lay on his left side in the next few days  Left leg tenderness Unable to rule out DVT, ultrasound was challenging Continue anticoagulation indefinitely If symptoms persist, may need to scan leg with CT rule out abscess or DVT   Total encounter time more than 25 minutes  Greater than 50% was spent in counseling and coordination of care with the patient  For questions or updates, please contact [facility] Please consult [email or web address] for contact info under       Signed, Julien Nordmann, MD  12/05/2019, 11:42 AM

## 2019-12-06 LAB — CULTURE, BLOOD (ROUTINE X 2)
Culture: NO GROWTH
Culture: NO GROWTH

## 2019-12-06 LAB — CBC WITH DIFFERENTIAL/PLATELET
Abs Immature Granulocytes: 0.34 10*3/uL — ABNORMAL HIGH (ref 0.00–0.07)
Basophils Absolute: 0 10*3/uL (ref 0.0–0.1)
Basophils Relative: 0 %
Eosinophils Absolute: 0.2 10*3/uL (ref 0.0–0.5)
Eosinophils Relative: 2 %
HCT: 29.2 % — ABNORMAL LOW (ref 39.0–52.0)
Hemoglobin: 9.1 g/dL — ABNORMAL LOW (ref 13.0–17.0)
Immature Granulocytes: 4 %
Lymphocytes Relative: 10 %
Lymphs Abs: 0.9 10*3/uL (ref 0.7–4.0)
MCH: 21.3 pg — ABNORMAL LOW (ref 26.0–34.0)
MCHC: 31.2 g/dL (ref 30.0–36.0)
MCV: 68.2 fL — ABNORMAL LOW (ref 80.0–100.0)
Monocytes Absolute: 0.8 10*3/uL (ref 0.1–1.0)
Monocytes Relative: 9 %
Neutro Abs: 6.6 10*3/uL (ref 1.7–7.7)
Neutrophils Relative %: 75 %
Platelets: 278 10*3/uL (ref 150–400)
RBC: 4.28 MIL/uL (ref 4.22–5.81)
RDW: 16.8 % — ABNORMAL HIGH (ref 11.5–15.5)
WBC: 8.8 10*3/uL (ref 4.0–10.5)
nRBC: 0 % (ref 0.0–0.2)

## 2019-12-06 LAB — BASIC METABOLIC PANEL
Anion gap: 9 (ref 5–15)
BUN: 18 mg/dL (ref 6–20)
CO2: 28 mmol/L (ref 22–32)
Calcium: 8.6 mg/dL — ABNORMAL LOW (ref 8.9–10.3)
Chloride: 95 mmol/L — ABNORMAL LOW (ref 98–111)
Creatinine, Ser: 1.01 mg/dL (ref 0.61–1.24)
GFR calc Af Amer: >60 mL/min (ref >60)
GFR calc non Af Amer: >60 mL/min (ref >60)
Glucose, Bld: 115 mg/dL — ABNORMAL HIGH (ref 70–99)
Potassium: 4.2 mmol/L (ref 3.5–5.1)
Sodium: 132 mmol/L — ABNORMAL LOW (ref 135–145)

## 2019-12-06 LAB — VIRUS CULTURE

## 2019-12-06 LAB — HEPARIN LEVEL (UNFRACTIONATED)
Heparin Unfractionated: 0.99 IU/mL — ABNORMAL HIGH (ref 0.30–0.70)
Heparin Unfractionated: 0.29 IU/mL — ABNORMAL LOW (ref 0.30–0.70)
Heparin Unfractionated: 0.59 IU/mL (ref 0.30–0.70)

## 2019-12-06 MED ORDER — HEPARIN (PORCINE) 25000 UT/250ML-% IV SOLN
2250.0000 [IU]/h | INTRAVENOUS | Status: DC
  Administered 2019-12-07 (×3): 2750 [IU]/h via INTRAVENOUS
  Administered 2019-12-08: 2500 [IU]/h via INTRAVENOUS
  Filled 2019-12-06 (×5): qty 250

## 2019-12-06 MED ORDER — AMIODARONE HCL 200 MG PO TABS: 200.0000 mg | ORAL_TABLET | Freq: Every day | ORAL | Status: DC

## 2019-12-06 NOTE — Progress Notes (Signed)
    [facility] has been requested to perform a transesophageal echocardiogram on Alfred [name] for GBS bacteremia.  After careful review of history and examination, the risks and benefits of transesophageal echocardiogram have been explained including risks of esophageal damage, perforation (1:10,000 risk), bleeding, pharyngeal hematoma as well as other potential complications associated with conscious sedation including aspiration, arrhythmia, respiratory failure and death. Alternatives to treatment were discussed, questions were answered. Patient is willing to proceed.   Eula Listen, PA-C 12/06/2019 11:11 AM

## 2019-12-06 NOTE — Progress Notes (Signed)
ANTICOAGULATION CONSULT NOTE  Pharmacy Consult for heparin Indication: atrial flutter  No Known Allergies  Patient Measurements: Height: 6\' 7"  (200.7 cm) Weight: (!) 180 kg (396 lb 13.3 oz) IBW/kg (Calculated) : 93.7 Heparin Dosing Weight: 141 kg (heparin adjusted body weight), initially used 113 kg  Vital Signs: Temp: 98.5 F (36.9 C) (09/01 0610) Temp Source: Oral (09/01 0610) BP: 117/67 (09/01 0610) Pulse Rate: 59 (09/01 0610)  Labs: Recent Labs    12/04/19 0435 12/04/19 0435 12/05/19 0407 12/06/19 0500  HGB 8.7*   < > 9.5* 9.1*  HCT 28.2*  --  30.2* 29.2*  PLT 305  --  322 278  HEPARINUNFRC 0.38  --  0.61 0.99*  CREATININE 0.92  --  0.87 1.01   < > = values in this interval not displayed.    Estimated Creatinine Clearance: 153.4 mL/min (by C-G formula based on SCr of 1.01 mg/dL).   Medical History: Past Medical History:  Diagnosis Date  . Lower extremity edema   . Morbid obesity (HCC) 11/27/2019  . Tobacco abuse 11/27/2019    Assessment: 54 year old patient with strep group G bacteremia. In atrial flutter with RVR on amiodarone and diltiazem. Plan for possible cardioversion. Pharmacy to start on heparin drip.  8/28 2303 HL 0.12 - rate increased to 2900 units/hr.  8/29 0614 HL 0.33  8/29 1227 HL 0.38  8/31 0407 HL 0.61, therapeutic 9/01 0500 HL 0.99, SUPRAtherapeutic, CBC stable  Goal of Therapy:  Heparin level 0.3-0.7 units/ml Monitor platelets by anticoagulation protocol: Yes   Plan:  HL is SUPRAtherapeutic. Will hold heparin drip x 1 hour then restart at reduced rate of 2600 units/hr.  Will recheck HL ~ 6 hours after heparin restarted.    11/01, PharmD Clinical Pharmacist 12/06/2019 7:25 AM

## 2019-12-06 NOTE — Progress Notes (Signed)
Occupational Therapy Treatment Patient Details Name: Alfred [name] MRN: [id number] DOB: [birth year] Today's Date: 12/06/2019    History of present illness Alfred [name] is a 54yoM who [facility] c pain and LEE. PMH: chronic BLE venous statis, hepatits. Pt admitted with BLE cellulitis. Since arrival pt having issues with fever and tachy into 140s-160s. At time of evaluation, pt continues to have significant LEE bilat.   OT comments  Pt making great progress this session and very motivated to return to bed. Pt seated in recliner chair and able to scoot self forwards towards edge of chair with increased time. Pt repositioned LEs himself with B UEs. OT educated and demonstrated anterior weight shift with "nose over toes". Pt given min cuing for hand placement. Pt unable shift weight anteriorly enough on first attempt. On second attempt therapist providing max A for anterior weight shift and pt standing from recliner chair. Pt taking several steps to the R with min A and use of RW. Pt reaching back with cuing to sit on EOB. Mod A to lift B LEs into bed. RN arrived with pain medication. Pt able to reposition self in bed with min A for L LE. Pt very excited about progress today. Pt continues to benefit from acute OT intervention and recommendation for HHOT at discharge to continue to address functional deficits.   Follow Up Recommendations  Home health OT;Supervision/Assistance - 24 hour    Equipment Recommendations  Other (comment) (bariatric drop arm commode chair)    Recommendations for Other Services Other (comment) (none at this time)    Precautions / Restrictions Precautions Precautions: Fall Precaution Comments: bilat leg wounds, Left knee pain       Mobility Bed Mobility Overal bed mobility: Needs Assistance Bed Mobility: Sit to Supine       Sit to supine: Mod assist   General bed mobility comments: A for B LEs back into bed  Transfers Overall transfer level:  Needs assistance Equipment used: Rolling walker (2 wheeled) Transfers: Sit to/from Stand Sit to Stand: Max assist         General transfer comment: max A to stand from recliner chair. Pt unable to fully stand on 1st attempt. On second attempt, therapist assisting with anterior weight shift and pt able to come to full stand with max A.    Balance Overall balance assessment: Needs assistance Sitting-balance support: Feet supported Sitting balance-Leahy Scale: Good Sitting balance - Comments: no LOB in sitting with feet support only   Standing balance support: During functional activity;Bilateral upper extremity supported Standing balance-Leahy Scale: Fair Standing balance comment: heavy reliance on BUE on RW however pt was steady on feet once in standing.          ADL either performed or assessed with clinical judgement        Vision Baseline Vision/History: No visual deficits Patient Visual Report: No change from baseline            Cognition Arousal/Alertness: Awake/alert Behavior During Therapy: WFL for tasks assessed/performed Overall Cognitive Status: Within Functional Limits for tasks assessed   General Comments: Ox4. Pt agreeable to OT intervention and requesting assist to return to bed. Pt utilized B UEs to reposition LEs for sit <>stand.                   Pertinent Vitals/ Pain       Pain Assessment: Faces Faces Pain Scale: Hurts even more Pain Location: LLE with movements Pain Descriptors /  Indicators: Aching;Discomfort;Grimacing Pain Intervention(s): Limited activity within patient's tolerance;Monitored during session;Repositioned;RN gave pain meds during session      Frequency  Min 1X/week        Progress Toward Goals  OT Goals(current goals can now be found in the care plan section)  Progress towards OT goals: Progressing toward goals  Acute Rehab OT Goals Patient Stated Goal: to move better OT Goal Formulation: With patient Time For  Goal Achievement: 12/13/19 Potential to Achieve Goals: Good  Plan Discharge plan remains appropriate       AM-PAC OT "6 Clicks" Daily Activity     Outcome Measure   Help from another person eating meals?: None Help from another person taking care of personal grooming?: None Help from another person toileting, which includes using toliet, bedpan, or urinal?: A Lot Help from another person bathing (including washing, rinsing, drying)?: A Lot Help from another person to put on and taking off regular upper body clothing?: A Little Help from another person to put on and taking off regular lower body clothing?: Total 6 Click Score: 16    End of Session Equipment Utilized During Treatment: Rolling walker  OT Visit Diagnosis: Muscle weakness (generalized) (M62.81);Pain Pain - Right/Left:  (bilateral) Pain - part of body: Leg   Activity Tolerance Patient limited by pain   Patient Left in bed;with call bell/phone within reach;with bed alarm set;with nursing/sitter in room   Nurse Communication Mobility status;Patient requests pain meds        Time: 1411-1434 OT Time Calculation (min): 23 min  Charges: OT General Charges $OT Visit: 1 Visit OT Treatments $Therapeutic Activity: 23-37 mins  Jackquline Denmark, MS, OTR/L , CBIS ascom [phone]  12/06/19, 3:44 PM

## 2019-12-06 NOTE — Progress Notes (Signed)
Progress Note    Alfred [name]  ZOX:[id number] DOB: [birth year]  DOA: 11/24/2019 PCP: Patient, No Pcp Per      Brief Narrative:    Medical records reviewed and are as summarized below:  Alfred [name] is a 54 y.o. male       Assessment/Plan:   Principal Problem:   Streptococcal sepsis, unspecified (HCC) Active Problems:   Cellulitis of left leg   AKI (acute kidney injury) (HCC)   Bacteremia   Lower extremity edema   Morbid obesity (HCC)   Tobacco abuse   Paroxysmal atrial fibrillation (HCC)   Acute on chronic heart failure with preserved ejection fraction (HFpEF) (HCC)   Atrial flutter (HCC)   Severe sepsis and group B strep bacteremia, left leg cellulitis  Atrial flutter with RVR (CHaDs2VASC score of 1)-converted to NSR  Significantly elevated D-dimer (1,759)-CT venogram, venous duplex and CTA did not show any evidence of venous thromboembolism.  Acute on chronic diastolic CHF  Hypokalemia-improved  Chronic venous insufficiency/venous stasis  Hepatitis C-outpatient follow-up for further management  Tobacco use disorder  AKI-resolved  Body mass index is 44.7 kg/m.  (Morbid obesity): This complicates overall care and prognosis   PLAN  Continue IV Lasix for acute CHF Plan for TEE tomorrow Continue IV Unasyn and oral ciprofloxacin Continue oral amiodarone for atrial flutter.   Diet Order            Diet NPO time specified Except for: Sips with Meds  Diet effective midnight           Diet heart healthy/carb modified Room service appropriate? Yes; Fluid consistency: Thin  Diet effective now                    Consultants:  Cardiologist  Infectious disease  Procedures:      Medications:   . amiodarone  200 mg Oral BID  . [START ON 12/13/2019] amiodarone  200 mg Oral Daily  . Chlorhexidine Gluconate Cloth  6 each Topical Daily  . diclofenac Sodium  2 g Topical QID  . furosemide  60 mg Intravenous BID  .  metoprolol tartrate  50 mg Oral BID  . polyethylene glycol  17 g Oral Daily  . potassium chloride  20 mEq Oral Daily  . senna  2 tablet Oral QHS  . sodium chloride flush  10-40 mL Intracatheter Q12H   Continuous Infusions: . sodium chloride 250 mL (11/30/19 2044)  . ampicillin-sulbactam (UNASYN) IV 3 g (12/06/19 1231)  . ciprofloxacin 400 mg (12/06/19 1125)  . heparin 2,600 Units/hr (12/06/19 0840)  . sodium chloride       Anti-infectives (From admission, onward)   Start     Dose/Rate Route Frequency Ordered Stop   12/05/19 0000  Ampicillin-Sulbactam (UNASYN) 3 g in sodium chloride 0.9 % 100 mL IVPB        3 g 200 mL/hr over 30 Minutes Intravenous Every 6 hours 12/04/19 1622     11/30/19 2000  ciprofloxacin (CIPRO) IVPB 400 mg        400 mg 200 mL/hr over 60 Minutes Intravenous Every 12 hours 11/30/19 1715     11/30/19 1800  ceFAZolin (ANCEF) IVPB 2g/100 mL premix  Status:  Discontinued        2 g 200 mL/hr over 30 Minutes Intravenous Every 8 hours 11/30/19 1715 12/04/19 1622   11/30/19 1800  clindamycin (CLEOCIN) IVPB 900 mg  Status:  Discontinued  900 mg 100 mL/hr over 30 Minutes Intravenous Every 8 hours 11/30/19 1715 12/04/19 1622   11/29/19 0600  Ampicillin-Sulbactam (UNASYN) 3 g in sodium chloride 0.9 % 100 mL IVPB  Status:  Discontinued        3 g 200 mL/hr over 30 Minutes Intravenous Every 6 hours 11/28/19 0918 11/30/19 1538   11/25/19 0800  cefTRIAXone (ROCEPHIN) 2 g in sodium chloride 0.9 % 100 mL IVPB  Status:  Discontinued        2 g 200 mL/hr over 30 Minutes Intravenous Every 24 hours 11/25/19 0544 11/28/19 0918   11/25/19 0600  vancomycin (VANCOREADY) IVPB 750 mg/150 mL  Status:  Discontinued        750 mg 150 mL/hr over 60 Minutes Intravenous Every 12 hours 11/24/19 2029 11/27/19 1525   11/24/19 2200  piperacillin-tazobactam (ZOSYN) IVPB 3.375 g  Status:  Discontinued        3.375 g 12.5 mL/hr over 240 Minutes Intravenous Every 8 hours 11/24/19 2026  11/25/19 0543   11/24/19 2015  piperacillin-tazobactam (ZOSYN) IVPB 3.375 g  Status:  Discontinued        3.375 g 100 mL/hr over 30 Minutes Intravenous  Once 11/24/19 2004 11/24/19 2026   11/24/19 2015  vancomycin (VANCOCIN) IVPB 1000 mg/200 mL premix  Status:  Discontinued        1,000 mg 200 mL/hr over 60 Minutes Intravenous  Once 11/24/19 2004 11/24/19 2025   11/24/19 1845  vancomycin (VANCOREADY) IVPB 2000 mg/400 mL        2,000 mg 200 mL/hr over 120 Minutes Intravenous  Once 11/24/19 1748 11/25/19 0130   11/24/19 1745  ceFEPIme (MAXIPIME) 2 g in sodium chloride 0.9 % 100 mL IVPB  Status:  Discontinued        2 g 200 mL/hr over 30 Minutes Intravenous Every 8 hours 11/24/19 1732 11/24/19 2025             Family Communication/Anticipated D/C date and plan/Code Status   DVT prophylaxis: Off of IV heparin today.  Plan for TEE tomorrow     Code Status: Full Code  Family Communication: Discussed with patient Disposition Plan:    Status is: Inpatient  Remains inpatient appropriate because:Ongoing diagnostic testing needed not appropriate for outpatient work up and Inpatient level of care appropriate due to severity of illness   Dispo: The patient is from: Home              Anticipated d/c is to: Home              Anticipated d/c date is: 3 days              Patient currently is not medically stable to d/c.           Subjective:   No new complaints.  Left leg is still "sore"  Objective:    Vitals:   12/06/19 0610 12/06/19 0741 12/06/19 1113 12/06/19 1516  BP: 117/67 (!) 121/52 (!) 109/59 116/67  Pulse: (!) 59 62 (!) 58 (!) 58  Resp: 20 17 16 18   Temp: 98.5 F (36.9 C) 98 F (36.7 C) 98 F (36.7 C) 98.4 F (36.9 C)  TempSrc: Oral Oral Oral Oral  SpO2: 100% 93% 100% 97%  Weight:      Height:       No data found.   Intake/Output Summary (Last 24 hours) at 12/06/2019 1619 Last data filed at 12/06/2019 1232 Gross per 24 hour  Intake 240 ml  Output  4775 ml  Net -4535 ml   Filed Weights   12/02/19 0539 12/03/19 0448 12/05/19 0424  Weight: (!) 188.8 kg (!) 188.2 kg (!) 180 kg    Exam:  GEN: NAD SKIN: Warm and dry EYES: EOMI ENT: MMM CV: RRR PULM: CTA B ABD: soft, ND, NT, +BS CNS: AAO x 3, non focal EXT: Dressing on bilateral legs   Data Reviewed:   I have personally reviewed following labs and imaging studies:  Labs: Labs show the following:   Basic Metabolic Panel: Recent Labs  Lab 12/01/19 0059 12/01/19 0059 12/02/19 0532 12/02/19 0532 12/03/19 1962 12/03/19 2297 12/04/19 0435 12/04/19 0435 12/05/19 0407 12/06/19 0500  NA 136   < > 134*  --  132*  --  135  --  135 132*  K 4.0   < > 3.7   < > 3.8   < > 4.3   < > 4.4 4.2  CL 96*   < > 96*  --  94*  --  98  --  96* 95*  CO2 29   < > 33*  --  27  --  30  --  28 28  GLUCOSE 117*   < > 118*  --  118*  --  114*  --  110* 115*  BUN 14   < > 13  --  14  --  17  --  17 18  CREATININE 0.96   < > 0.82  --  0.90  --  0.92  --  0.87 1.01  CALCIUM 7.8*   < > 7.5*  --  8.1*  --  8.3*  --  8.3* 8.6*  MG 1.9  --   --   --  2.1  --   --   --   --   --    < > = values in this interval not displayed.   GFR Estimated Creatinine Clearance: 153.4 mL/min (by C-G formula based on SCr of 1.01 mg/dL). Liver Function Tests: No results for input(s): AST, ALT, ALKPHOS, BILITOT, PROT, ALBUMIN in the last 168 hours. No results for input(s): LIPASE, AMYLASE in the last 168 hours. No results for input(s): AMMONIA in the last 168 hours. Coagulation profile No results for input(s): INR, PROTIME in the last 168 hours.  CBC: Recent Labs  Lab 12/02/19 0532 12/03/19 0614 12/04/19 0435 12/05/19 0407 12/06/19 0500  WBC 12.3* 11.4* 11.8* 11.3* 8.8  NEUTROABS 9.8* 9.0* 9.2* 8.7* 6.6  HGB 8.5* 9.1* 8.7* 9.5* 9.1*  HCT 26.8* 29.0* 28.2* 30.2* 29.2*  MCV 68.0* 68.4* 70.0* 67.9* 68.2*  PLT 252 245 305 322 278   Cardiac Enzymes: No results for input(s): CKTOTAL, CKMB, CKMBINDEX,  TROPONINI in the last 168 hours. BNP (last 3 results) No results for input(s): PROBNP in the last 8760 hours. CBG: No results for input(s): GLUCAP in the last 168 hours. D-Dimer: No results for input(s): DDIMER in the last 72 hours. Hgb A1c: No results for input(s): HGBA1C in the last 72 hours. Lipid Profile: No results for input(s): CHOL, HDL, LDLCALC, TRIG, CHOLHDL, LDLDIRECT in the last 72 hours. Thyroid function studies: No results for input(s): TSH, T4TOTAL, T3FREE, THYROIDAB in the last 72 hours.  Invalid input(s): FREET3 Anemia work up: No results for input(s): VITAMINB12, FOLATE, FERRITIN, TIBC, IRON, RETICCTPCT in the last 72 hours. Sepsis Labs: Recent Labs  Lab 12/03/19 0614 12/04/19 0435 12/05/19 0407 12/06/19 0500  WBC 11.4* 11.8* 11.3* 8.8    Microbiology Recent  Results (from the past 240 hour(s))  Aerobic Culture (superficial specimen)     Status: None   Collection Time: 11/27/19  4:50 PM   Specimen: Wound  Result Value Ref Range Status   Specimen Description   Final    WOUND Performed at [facility], [address] [place], [place] [address]    Special Requests   Final    LEFT LEG/SHIN Performed at [facility], [address] [place], [place] [address]    Gram Stain   Final    RARE WBC PRESENT,BOTH PMN AND MONONUCLEAR MODERATE GRAM POSITIVE COCCI FEW GRAM NEGATIVE RODS    Culture   Final    MODERATE PSEUDOMONAS AERUGINOSA FEW STREPTOCOCCUS GROUP G Beta hemolytic streptococci are predictably susceptible to penicillin and other beta lactams. Susceptibility testing not routinely performed. Performed at [facility], [address], [place], [place] [address]    Report Status 12/02/2019 FINAL  Final   Organism ID, Bacteria PSEUDOMONAS AERUGINOSA  Final      Susceptibility   Pseudomonas aeruginosa - MIC*    CEFTAZIDIME >=64 RESISTANT Resistant     CIPROFLOXACIN <=0.25 SENSITIVE Sensitive     GENTAMICIN <=1 SENSITIVE  Sensitive     IMIPENEM 2 SENSITIVE Sensitive     * MODERATE PSEUDOMONAS AERUGINOSA  Virus culture     Status: None   Collection Time: 11/27/19  4:50 PM   Specimen: Leg, Left; Wound  Result Value Ref Range Status   Viral Culture No virus isolated.  Corrected    Comment: (NOTE) Please indicate source of specimen on all future request forms. Performed At: [facility] [address] [place], [place] [id number] Jolene Schimke MD OZ:[id number] CORRECTED ON 09/01 AT 1036: PREVIOUSLY REPORTED AS Comment    Source of Sample WD LEFT SHIN  Final    Comment: Performed at [facility], [address] [place], [place] [address]  Culture, blood (single) w Reflex to ID Panel     Status: None   Collection Time: 11/28/19  1:20 AM   Specimen: BLOOD  Result Value Ref Range Status   Specimen Description BLOOD RIGHT ANTECUBITAL  Final   Special Requests   Final    BOTTLES DRAWN AEROBIC AND ANAEROBIC Blood Culture results may not be optimal due to an inadequate volume of blood received in culture bottles   Culture   Final    NO GROWTH 5 DAYS Performed at [facility], [address] [place], [place] [address]    Report Status 12/03/2019 FINAL  Final  CULTURE, BLOOD (ROUTINE X 2) w Reflex to ID Panel     Status: None   Collection Time: 12/01/19 12:49 AM   Specimen: BLOOD  Result Value Ref Range Status   Specimen Description BLOOD RFOA  Final   Special Requests BOTTLES DRAWN AEROBIC AND ANAEROBIC BCAV  Final   Culture   Final    NO GROWTH 5 DAYS Performed at [facility], [address] [place], [place] [address]    Report Status 12/06/2019 FINAL  Final  CULTURE, BLOOD (ROUTINE X 2) w Reflex to ID Panel     Status: None   Collection Time: 12/01/19  1:05 AM   Specimen: BLOOD  Result Value Ref Range Status   Specimen Description BLOOD LEFT HAND  Final   Special Requests BOTTLES DRAWN AEROBIC AND ANAEROBIC BCAV  Final   Culture   Final    NO GROWTH 5  DAYS Performed at [facility], [address], [place], [place] [address]    Report Status 12/06/2019 FINAL  Final    Procedures and diagnostic studies:  CT VENOGRAM ABD/PEL  Result Date: 12/05/2019 CLINICAL DATA:  Inguinal and pelvic lymphadenopathy. Chronic lower extremity venous stasis. EXAM: CT VENOGRAM OF THE ABDOMEN AND PELVIS TECHNIQUE: Multidetector CT imaging of the abdomen and pelvis was performed using the standard venogram protocol following bolus administration of intravenous contrast. CONTRAST:  OMNIPAQUE IOHEXOL 350 MG/ML SOLN COMPARISON:  Chest CTA 11/26/2019 FINDINGS: Lower chest: Lung bases are clear. Hepatobiliary: Normal appearance of the liver, gallbladder and portal venous system. No biliary dilatation. Pancreas: Unremarkable. No pancreatic ductal dilatation or surrounding inflammatory changes. Spleen: Normal in size without focal abnormality. Adrenals/Urinary Tract: Mild fullness of the left adrenal gland medial limb. Otherwise, the adrenal glands are unremarkable. Normal appearance of both kidneys without hydronephrosis. No suspicious renal lesion. Normal appearance of the urinary bladder. Stomach/Bowel: Stomach is within normal limits. Appendix appears normal. No evidence of bowel wall thickening, distention, or inflammatory changes. Vascular/Lymphatic: Atherosclerotic calcifications in the abdominal aorta without aneurysm. Portal venous system is patent. Main hepatic veins are patent. IVC and renal veins are patent. Bilateral iliac veins are widely patent without thrombus or compression. Proximal femoral veins appear to be patent. Enlarged inguinal lymph nodes, left side greater than right. Mild stranding or edema around left inguinal lymph nodes. Index lymph node in the left inguinal region measures 2.5 cm in the short axis on sequence 5, image 110. Mildly prominent lymph nodes along the left pelvic sidewall on sequence 5 image 85. Mildly prominent iliac lymph  nodes. Reproductive: Prostate is unremarkable. Other: Negative for ascites. Ventral hernia containing fat. Minimal stranding within the herniating fat. Musculoskeletal: No acute bone abnormality. Subcutaneous edema in the left upper thigh. IMPRESSION: 1. Normal appearance of the venous structures in the abdomen and pelvis without compression, obstruction or thrombus. 2. Enlarged inguinal and pelvic lymph nodes, left side greater than right. In addition, there is subcutaneous edema in the left upper thigh. Lymphadenopathy could be reactive change from the chronic edema but this lymph node enlargement is nonspecific. If there is concern for a neoplastic process, the left inguinal lymph nodes would be amendable to biopsy. 3.  Aortic Atherosclerosis (ICD10-I70.0). 4. Umbilical hernia containing fat. Minimal stranding associated with this umbilical hernia. Electronically Signed   By: Richarda Overlie M.D.   On: 12/05/2019 07:59               LOS: 12 days   Nikola Marone  [facility]   Pager on [email or web address]. If 7PM-7AM, please contact night-coverage at [email or web address]     12/06/2019, 4:19 PM

## 2019-12-06 NOTE — Progress Notes (Signed)
ANTICOAGULATION CONSULT NOTE  Pharmacy Consult for heparin Indication: atrial flutter  No Known Allergies  Patient Measurements: Height: 6\' 7"  (200.7 cm) Weight: (!) 180 kg (396 lb 13.3 oz) IBW/kg (Calculated) : 93.7 Heparin Dosing Weight: 141 kg (heparin adjusted body weight), initially used 113 kg  Vital Signs: Temp: 98.4 F (36.9 C) (09/01 1516) Temp Source: Oral (09/01 1516) BP: 116/67 (09/01 1516) Pulse Rate: 58 (09/01 1516)  Labs: Recent Labs    12/04/19 0435 12/04/19 0435 12/05/19 0407 12/06/19 0500 12/06/19 1430  HGB 8.7*   < > 9.5* 9.1*  --   HCT 28.2*  --  30.2* 29.2*  --   PLT 305  --  322 278  --   HEPARINUNFRC 0.38   < > 0.61 0.99* 0.29*  CREATININE 0.92  --  0.87 1.01  --    < > = values in this interval not displayed.    Estimated Creatinine Clearance: 153.4 mL/min (by C-G formula based on SCr of 1.01 mg/dL).   Medical History: Past Medical History:  Diagnosis Date  . Lower extremity edema   . Morbid obesity (HCC) 11/27/2019  . Tobacco abuse 11/27/2019    Assessment: 54 year old patient with strep group G bacteremia. Pt presented with atrial flutter with RVR on amiodarone and diltiazem. Pt is now NSR on amiodarone. PMH includes CHF. CBC stable. Pharmacy consulted for monitoring and adjustment of heparin drip.  8/28 2303 HL 0.12 - rate increased to 2900 units/hr 8/29 0614 HL 0.33  8/29 1227 HL 0.38  8/31 0407 HL 0.61 9/01 0500 HL 0.99 - rate decreased to 2500 units/hr 9/01 1430 HL 0.29 - rate increased to 2750 units/hr   Goal of Therapy:  Heparin level 0.3-0.7 units/ml Monitor platelets by anticoagulation protocol: Yes   Plan:  HL is slightly subtherapeutic. Since pt was supratherapeutic at 2900 units/hr and subtherapeutic at 2500 units/hr - will increase rate to 2750 units/hr. Will recheck HL ~ 6 hours after heparin restarted.  11/01, PharmD Pharmacy Resident  12/06/2019 4:05 PM

## 2019-12-06 NOTE — Progress Notes (Signed)
[facility] INFECTIOUS DISEASE PROGRESS NOTE Date of Admission:  11/24/2019     ID: Alfred [name] is a 54 y.o. male with  Strep bacteremia  Principal Problem:   Streptococcal sepsis, unspecified (HCC) Active Problems:   Cellulitis of left leg   AKI (acute kidney injury) (HCC)   Bacteremia   Lower extremity edema   Morbid obesity (HCC)   Tobacco abuse   Paroxysmal atrial fibrillation (HCC)   Acute on chronic heart failure with preserved ejection fraction (HFpEF) (HCC)   Atrial flutter (HCC)   Subjective: No fevers, WBC down to 11.   ROS  Eleven systems are reviewed and negative except per hpi  Medications:  Antibiotics Given (last 72 hours)    Date/Time Action Medication Dose Rate   12/03/19 2132 New Bag/Given   ceFAZolin (ANCEF) IVPB 2g/100 mL premix 2 g 200 mL/hr   12/03/19 2133 New Bag/Given   clindamycin (CLEOCIN) IVPB 900 mg 900 mg 100 mL/hr   12/03/19 2230 New Bag/Given   ciprofloxacin (CIPRO) IVPB 400 mg 400 mg 200 mL/hr   12/04/19 0523 New Bag/Given   ceFAZolin (ANCEF) IVPB 2g/100 mL premix 2 g 200 mL/hr   12/04/19 0524 New Bag/Given   clindamycin (CLEOCIN) IVPB 900 mg 900 mg 100 mL/hr   12/04/19 1610 New Bag/Given   ciprofloxacin (CIPRO) IVPB 400 mg 400 mg 200 mL/hr   12/04/19 1201 New Bag/Given   clindamycin (CLEOCIN) IVPB 900 mg 900 mg 100 mL/hr   12/04/19 1509 New Bag/Given   ceFAZolin (ANCEF) IVPB 2g/100 mL premix 2 g 200 mL/hr   12/04/19 2100 New Bag/Given   ciprofloxacin (CIPRO) IVPB 400 mg 400 mg 200 mL/hr   12/05/19 0000 New Bag/Given   Ampicillin-Sulbactam (UNASYN) 3 g in sodium chloride 0.9 % 100 mL IVPB 3 g 200 mL/hr   12/05/19 0522 New Bag/Given   Ampicillin-Sulbactam (UNASYN) 3 g in sodium chloride 0.9 % 100 mL IVPB 3 g 200 mL/hr   12/05/19 0749 New Bag/Given   ciprofloxacin (CIPRO) IVPB 400 mg 400 mg 200 mL/hr   12/05/19 1146 New Bag/Given   Ampicillin-Sulbactam (UNASYN) 3 g in sodium chloride 0.9 % 100 mL IVPB 3 g 200 mL/hr    12/05/19 1752 New Bag/Given   Ampicillin-Sulbactam (UNASYN) 3 g in sodium chloride 0.9 % 100 mL IVPB 3 g 200 mL/hr   12/05/19 2049 New Bag/Given   ciprofloxacin (CIPRO) IVPB 400 mg 400 mg 200 mL/hr   12/05/19 2310 New Bag/Given   Ampicillin-Sulbactam (UNASYN) 3 g in sodium chloride 0.9 % 100 mL IVPB 3 g 200 mL/hr   12/06/19 0620 New Bag/Given   Ampicillin-Sulbactam (UNASYN) 3 g in sodium chloride 0.9 % 100 mL IVPB 3 g 200 mL/hr   12/06/19 1125 New Bag/Given   ciprofloxacin (CIPRO) IVPB 400 mg 400 mg 200 mL/hr   12/06/19 1231 New Bag/Given   Ampicillin-Sulbactam (UNASYN) 3 g in sodium chloride 0.9 % 100 mL IVPB 3 g 200 mL/hr   12/06/19 1638 New Bag/Given   Ampicillin-Sulbactam (UNASYN) 3 g in sodium chloride 0.9 % 100 mL IVPB 3 g 200 mL/hr     . amiodarone  200 mg Oral BID  . [START ON 12/13/2019] amiodarone  200 mg Oral Daily  . Chlorhexidine Gluconate Cloth  6 each Topical Daily  . diclofenac Sodium  2 g Topical QID  . furosemide  60 mg Intravenous BID  . metoprolol tartrate  50 mg Oral BID  . polyethylene glycol  17 g Oral Daily  .  potassium chloride  20 mEq Oral Daily  . senna  2 tablet Oral QHS  . sodium chloride flush  10-40 mL Intracatheter Q12H    Objective: Vital signs in last 24 hours: Temp:  [98 F (36.7 C)-98.6 F (37 C)] 98.4 F (36.9 C) (09/01 1516) Pulse Rate:  [58-71] 58 (09/01 1516) Resp:  [16-20] 18 (09/01 1516) BP: (109-121)/(52-67) 116/67 (09/01 1516) SpO2:  [93 %-100 %] 97 % (09/01 1516) No distress until the leg is touched Chest b/l air entry, crepts bases Hss1s2 Abd - obese- soft Extremities bil  edema legs,     Lab Results Recent Labs    12/05/19 0407 12/06/19 0500  WBC 11.3* 8.8  HGB 9.5* 9.1*  HCT 30.2* 29.2*  NA 135 132*  K 4.4 4.2  CL 96* 95*  CO2 28 28  BUN 17 18  CREATININE 0.87 1.01    Microbiology: Results for orders placed or performed during the hospital encounter of 11/24/19  Culture, blood (Routine x 2)     Status: None     Collection Time: 11/24/19  3:14 PM   Specimen: BLOOD  Result Value Ref Range Status   Specimen Description BLOOD BLOOD RIGHT HAND  Final   Special Requests   Final    BOTTLES DRAWN AEROBIC AND ANAEROBIC Blood Culture results may not be optimal due to an inadequate volume of blood received in culture bottles   Culture   Final    NO GROWTH 5 DAYS Performed at [facility], [address] [place], [place] [address]    Report Status 11/29/2019 FINAL  Final  Culture, blood (Routine x 2)     Status: Abnormal   Collection Time: 11/24/19  5:20 PM   Specimen: BLOOD  Result Value Ref Range Status   Specimen Description   Final    BLOOD BLOOD RIGHT ARM Performed at [facility], [address] [place], [place] [address]    Special Requests   Final    BOTTLES DRAWN AEROBIC AND ANAEROBIC Blood Culture adequate volume Performed at [facility], [address] [place], [place] [address]    Culture  Setup Time   Final    Organism ID to follow GRAM POSITIVE COCCI IN BOTH AEROBIC AND ANAEROBIC BOTTLES CRITICAL RESULT CALLED TO, READ BACK BY AND VERIFIED WITH: SCOTT HALL 11/25/19 AT 0530 HS Performed at [facility], [address] [place], [place] [address]    Culture STREPTOCOCCUS GROUP G (A)  Final   Report Status 11/27/2019 FINAL  Final   Organism ID, Bacteria STREPTOCOCCUS GROUP G  Final      Susceptibility   Streptococcus group g - MIC*    CLINDAMYCIN RESISTANT Resistant     AMPICILLIN <=0.25 SENSITIVE Sensitive     ERYTHROMYCIN >=8 RESISTANT Resistant     VANCOMYCIN 0.5 SENSITIVE Sensitive     CEFTRIAXONE <=0.12 SENSITIVE Sensitive     LEVOFLOXACIN 0.5 SENSITIVE Sensitive     * STREPTOCOCCUS GROUP G  Blood Culture ID Panel (Reflexed)     Status: Abnormal   Collection Time: 11/24/19  5:20 PM  Result Value Ref Range Status   Enterococcus faecalis NOT DETECTED NOT DETECTED Final   Enterococcus Faecium NOT DETECTED NOT DETECTED Final    Listeria monocytogenes NOT DETECTED NOT DETECTED Final   Staphylococcus species NOT DETECTED NOT DETECTED Final   Staphylococcus aureus (BCID) NOT DETECTED NOT DETECTED Final   Staphylococcus epidermidis NOT DETECTED NOT DETECTED Final   Staphylococcus lugdunensis NOT  DETECTED NOT DETECTED Final   Streptococcus species DETECTED (A) NOT DETECTED Final    Comment: Not Enterococcus species, Streptococcus agalactiae, Streptococcus pyogenes, or Streptococcus pneumoniae. CRITICAL RESULT CALLED TO, READ BACK BY AND VERIFIED WITH: SCOTT HALL 11/25/19 AT 0530 HS    Streptococcus agalactiae NOT DETECTED NOT DETECTED Final   Streptococcus pneumoniae NOT DETECTED NOT DETECTED Final   Streptococcus pyogenes NOT DETECTED NOT DETECTED Final   A.calcoaceticus-baumannii NOT DETECTED NOT DETECTED Final   Bacteroides fragilis NOT DETECTED NOT DETECTED Final   Enterobacterales NOT DETECTED NOT DETECTED Final   Enterobacter cloacae complex NOT DETECTED NOT DETECTED Final   Escherichia coli NOT DETECTED NOT DETECTED Final   Klebsiella aerogenes NOT DETECTED NOT DETECTED Final   Klebsiella oxytoca NOT DETECTED NOT DETECTED Final   Klebsiella pneumoniae NOT DETECTED NOT DETECTED Final   Proteus species NOT DETECTED NOT DETECTED Final   Salmonella species NOT DETECTED NOT DETECTED Final   Serratia marcescens NOT DETECTED NOT DETECTED Final   Haemophilus influenzae NOT DETECTED NOT DETECTED Final   Neisseria meningitidis NOT DETECTED NOT DETECTED Final   Pseudomonas aeruginosa NOT DETECTED NOT DETECTED Final   Stenotrophomonas maltophilia NOT DETECTED NOT DETECTED Final   Candida albicans NOT DETECTED NOT DETECTED Final   Candida auris NOT DETECTED NOT DETECTED Final   Candida glabrata NOT DETECTED NOT DETECTED Final   Candida krusei NOT DETECTED NOT DETECTED Final   Candida parapsilosis NOT DETECTED NOT DETECTED Final   Candida tropicalis NOT DETECTED NOT DETECTED Final   Cryptococcus neoformans/gattii NOT  DETECTED NOT DETECTED Final    Comment: Performed at [facility], [address] [place], [place] [address]  SARS Coronavirus 2 by RT PCR (hospital order, performed in [facility] hospital lab) Nasopharyngeal Nasopharyngeal Swab     Status: None   Collection Time: 11/24/19  7:40 PM   Specimen: Nasopharyngeal Swab  Result Value Ref Range Status   SARS Coronavirus 2 NEGATIVE NEGATIVE Final    Comment: (NOTE) SARS-CoV-2 target nucleic acids are NOT DETECTED.  The SARS-CoV-2 RNA is generally detectable in upper and lower respiratory specimens during the acute phase of infection. The lowest concentration of SARS-CoV-2 viral copies this assay can detect is 250 copies / mL. A negative result does not preclude SARS-CoV-2 infection and should not be used as the sole basis for treatment or other patient management decisions.  A negative result may occur with improper specimen collection / handling, submission of specimen other than nasopharyngeal swab, presence of viral mutation(s) within the areas targeted by this assay, and inadequate number of viral copies (<250 copies / mL). A negative result must be combined with clinical observations, patient history, and epidemiological information.  Fact Sheet for Patients:   BoilerBrush.com.cy  Fact Sheet for Healthcare Providers: https://pope.com/  This test is not yet approved or  cleared by the [place] FDA and has been authorized for detection and/or diagnosis of SARS-CoV-2 by FDA under an Emergency Use Authorization (EUA).  This EUA will remain in effect (meaning this test can be used) for the duration of the COVID-19 declaration under Section 564(b)(1) of the Act, 21 U.S.C. section 360bbb-3(b)(1), unless the authorization is terminated or revoked sooner.  Performed at [facility], [address] [place], [place] [address]   C Difficile Quick Screen w PCR reflex      Status: None   Collection Time: 11/25/19 10:04 AM   Specimen: STOOL  Result Value Ref Range Status   C Diff antigen NEGATIVE NEGATIVE  Final   C Diff toxin NEGATIVE NEGATIVE Final   C Diff interpretation No C. difficile detected.  Final    Comment: Performed at [facility], [address] [place], [place] [address]  MRSA PCR Screening     Status: None   Collection Time: 11/25/19  4:20 PM   Specimen: Nasopharyngeal  Result Value Ref Range Status   MRSA by PCR NEGATIVE NEGATIVE Final    Comment:        The GeneXpert MRSA Assay (FDA approved for NASAL specimens only), is one component of a comprehensive MRSA colonization surveillance program. It is not intended to diagnose MRSA infection nor to guide or monitor treatment for MRSA infections. Performed at [facility], [address], [place], [place] [address]   Aerobic Culture (superficial specimen)     Status: None   Collection Time: 11/27/19  4:50 PM   Specimen: Wound  Result Value Ref Range Status   Specimen Description   Final    WOUND Performed at [facility], [address] [place], [place] [address]    Special Requests   Final    LEFT LEG/SHIN Performed at [facility], [address] [place], [place] [address]    Gram Stain   Final    RARE WBC PRESENT,BOTH PMN AND MONONUCLEAR MODERATE GRAM POSITIVE COCCI FEW GRAM NEGATIVE RODS    Culture   Final    MODERATE PSEUDOMONAS AERUGINOSA FEW STREPTOCOCCUS GROUP G Beta hemolytic streptococci are predictably susceptible to penicillin and other beta lactams. Susceptibility testing not routinely performed. Performed at [facility], [address], [place], [place] [address]    Report Status 12/02/2019 FINAL  Final   Organism ID, Bacteria PSEUDOMONAS AERUGINOSA  Final      Susceptibility   Pseudomonas aeruginosa - MIC*    CEFTAZIDIME >=64 RESISTANT Resistant     CIPROFLOXACIN <=0.25 SENSITIVE Sensitive     GENTAMICIN  <=1 SENSITIVE Sensitive     IMIPENEM 2 SENSITIVE Sensitive     * MODERATE PSEUDOMONAS AERUGINOSA  Virus culture     Status: None   Collection Time: 11/27/19  4:50 PM   Specimen: Leg, Left; Wound  Result Value Ref Range Status   Viral Culture No virus isolated.  Corrected    Comment: (NOTE) Please indicate source of specimen on all future request forms. Performed At: [facility] [address] [place], [place] [id number] Jolene Schimke MD DD:[id number] CORRECTED ON 09/01 AT 1036: PREVIOUSLY REPORTED AS Comment    Source of Sample WD LEFT SHIN  Final    Comment: Performed at [facility], [address] [place], [place] [address]  Culture, blood (single) w Reflex to ID Panel     Status: None   Collection Time: 11/28/19  1:20 AM   Specimen: BLOOD  Result Value Ref Range Status   Specimen Description BLOOD RIGHT ANTECUBITAL  Final   Special Requests   Final    BOTTLES DRAWN AEROBIC AND ANAEROBIC Blood Culture results may not be optimal due to an inadequate volume of blood received in culture bottles   Culture   Final    NO GROWTH 5 DAYS Performed at [facility], [address], [place], [place] [address]    Report Status 12/03/2019 FINAL  Final  CULTURE, BLOOD (ROUTINE X 2) w Reflex to ID Panel     Status: None   Collection Time: 12/01/19 12:49 AM   Specimen: BLOOD  Result Value Ref Range Status  Specimen Description BLOOD RFOA  Final   Special Requests BOTTLES DRAWN AEROBIC AND ANAEROBIC BCAV  Final   Culture   Final    NO GROWTH 5 DAYS Performed at [facility], [address] [place], [place] [address]    Report Status 12/06/2019 FINAL  Final  CULTURE, BLOOD (ROUTINE X 2) w Reflex to ID Panel     Status: None   Collection Time: 12/01/19  1:05 AM   Specimen: BLOOD  Result Value Ref Range Status   Specimen Description BLOOD LEFT HAND  Final   Special Requests BOTTLES DRAWN AEROBIC AND ANAEROBIC BCAV  Final   Culture   Final    NO  GROWTH 5 DAYS Performed at [facility], [address] [place], [place] [address]    Report Status 12/06/2019 FINAL  Final    Studies/Results: No results found.  Assessment/Plan: Streptococcus Group G bacteremia Source unclear- likely  Legs ( left)   need to r/o endocarditis- will need TEE- seen by cardiologist plan is to take him for TEE once CHF better controlled  Cellulitis left leg- with recent fever and leucocytosis Cont unasyn  Cont cipro for Pseudomonas  Elevate legs on 6 pillows Continue wraps   A. fib-reverted to sinus On Amiodarone Watch while on cipro  Thank you very much for the consult. Will follow with you.  Mick Sell   12/06/2019, 7:14 PM

## 2019-12-06 NOTE — Progress Notes (Signed)
Physical Therapy Treatment Patient Details Name: Alfred [name] MRN: [id number] DOB: January 09, 1966 Today's Date: 12/06/2019    History of Present Illness Alfred [name] is a 54yoM who comes [facility] on [facility] c pain and LEE. PMH: chronic BLE venous statis, hepatits. Pt admitted with BLE cellulitis. Since arrival pt having issues with fever and tachy into 140s-160s. At time of evaluation, pt continues to have significant LEE bilat.    PT Comments    Pt was long sitting in bed watching TV upon arriving. He agrees to PT session with encouragement. Once motivated, pt very pleasant and cooperative. He continues to endorse LLE pain/wekaness more so than RLE.  Max assist to achieve EOB sitting. Stood to bariatric RW with mod assist +2 for safety. Took ~ 3 steps to recliner. Stood from lower recliner height with mod-max +2. Once in standing with BUE support on RW, pt able to static stand with CGA. Min assist to ambulate 15ft with chair follow for safety. Pt does fatigue quickly but very pleased with progress and ability to ambulate still. Pt is very deconditioned and will continue to benefit form skilled PT to address endurance, strength, gait, and overall safe functional mobility deficits. Ongoing assessment of equipment needs however will at least require bariatric RW and BSC. RN/OT aware of pt's abilities and safety concerns.   Follow Up Recommendations  SNF;Other (comment) (Pt may have to DC home with HHPT due to insurance concerns)     Equipment Recommendations  Rolling walker with 5" wheels;3in1 (PT) (bariatric)    Recommendations for Other Services       Precautions / Restrictions Precautions Precautions: Fall Precaution Comments: bilat leg wounds, Left knee pain Restrictions Weight Bearing Restrictions: No    Mobility  Bed Mobility Overal bed mobility: Needs Assistance Bed Mobility: Supine to Sit;Sit to Supine     Supine to sit: Max assist     General bed mobility comments: Max  assist + increased time to achieve EOB short sit. Pt struggles with LLE advancement and relys heavily on therapist HHA to progress upper body to sitting  Transfers Overall transfer level: Needs assistance Equipment used: Rolling walker (2 wheeled) Transfers: Sit to/from Stand Sit to Stand: From elevated surface;+2 safety/equipment;+2 physical assistance;Mod assist;Max assist         General transfer comment: +2 for safety. Pt stood from elevated bed height with min-mod of two and then from recliner with +2 max. once in standing pt able to maintain standing with CGA  Ambulation/Gait Ambulation/Gait assistance: +2 safety/equipment;Min assist (chair follow) Gait Distance (Feet): 8 Feet Assistive device: Rolling walker (2 wheeled) (Bariatric) Gait Pattern/deviations: Step-to pattern;Trunk flexed;Wide base of support     General Gait Details: Pt was very pleased he could ambulat with min assist + 2nd person for chair follow. pt does fatigue quickly but overall tolerate ambulation well. ambulated 1 x 3 ft then 1 x ~ 8 ft without LOB. was able to progress LLE this date but continues to be weakness/pain        Balance Overall balance assessment: Needs assistance Sitting-balance support: Feet supported Sitting balance-Leahy Scale: Good Sitting balance - Comments: no LOB in sitting with feet support only   Standing balance support: During functional activity;Bilateral upper extremity supported Standing balance-Leahy Scale: Fair Standing balance comment: heavy reliance on BUE on RW however pt was steady on feet once in standing.         Cognition Arousal/Alertness: Awake/alert Behavior During Therapy: WFL for tasks assessed/performed Overall Cognitive Status:  Within Functional Limits for tasks assessed      General Comments: Pt A and O x 4. agrees to PT with a little encouragement. reports imporved pain control but still has LLE pain with movements and wt bearing              Pertinent Vitals/Pain Pain Assessment: 0-10 Pain Score: 4  Faces Pain Scale: Hurts a little bit Pain Location: LLE with movements Pain Descriptors / Indicators: Aching;Discomfort;Grimacing Pain Intervention(s): Limited activity within patient's tolerance;Monitored during session;Premedicated before session;Repositioned           PT Goals (current goals can now be found in the care plan section) Acute Rehab PT Goals Patient Stated Goal: to move better Progress towards PT goals: Progressing toward goals    Frequency    Min 2X/week      PT Plan Current plan remains appropriate       AM-PAC PT "6 Clicks" Mobility   Outcome Measure  Help needed turning from your back to your side while in a flat bed without using bedrails?: A Lot Help needed moving from lying on your back to sitting on the side of a flat bed without using bedrails?: A Lot Help needed moving to and from a bed to a chair (including a wheelchair)?: A Lot Help needed standing up from a chair using your arms (e.g., wheelchair or bedside chair)?: A Lot Help needed to walk in hospital room?: A Lot Help needed climbing 3-5 steps with a railing? : Total 6 Click Score: 11    End of Session Equipment Utilized During Treatment: Gait belt Activity Tolerance: Patient tolerated treatment well;Patient limited by fatigue Patient left: in chair;with call bell/phone within reach;with chair alarm set Nurse Communication: Mobility status PT Visit Diagnosis: Difficulty in walking, not elsewhere classified (R26.2);Unsteadiness on feet (R26.81);Muscle weakness (generalized) (M62.81)     Time: 8657-8469 PT Time Calculation (min) (ACUTE ONLY): 34 min  Charges:  $Gait Training: 8-22 mins $Therapeutic Activity: 8-22 mins                     Jetta Lout PTA 12/06/19, 1:57 PM

## 2019-12-06 NOTE — Progress Notes (Signed)
Progress Note  Patient Name: Alfred [name] Date of Encounter: 12/06/2019  Primary Cardiologist: Kirke Corin  Subjective   Maintaining sinus rhythm. No chest pain or dyspnea. He continues to report left leg pain. Net - 25.8 L for the admission with a documented - 2.7 L for the past 24 hours. Intake is minimally noted. Slight up trending in BUN/SCr this morning. No weight this morning.   Inpatient Medications    Scheduled Meds: . amiodarone  200 mg Oral BID  . Chlorhexidine Gluconate Cloth  6 each Topical Daily  . diclofenac Sodium  2 g Topical QID  . furosemide  60 mg Intravenous BID  . metoprolol tartrate  50 mg Oral BID  . polyethylene glycol  17 g Oral Daily  . potassium chloride  20 mEq Oral Daily  . senna  2 tablet Oral QHS  . sodium chloride flush  10-40 mL Intracatheter Q12H   Continuous Infusions: . sodium chloride 250 mL (11/30/19 2044)  . ampicillin-sulbactam (UNASYN) IV 3 g (12/06/19 0620)  . ciprofloxacin 400 mg (12/05/19 2049)  . heparin Stopped (12/06/19 0734)  . sodium chloride     PRN Meds: sodium chloride, acetaminophen **OR** acetaminophen, albuterol, bisacodyl, guaiFENesin, HYDROcodone-acetaminophen, ipratropium-albuterol, melatonin, morphine injection, ondansetron **OR** ondansetron (ZOFRAN) IV, sodium chloride flush   Vital Signs    Vitals:   12/05/19 1716 12/05/19 2309 12/06/19 0610 12/06/19 0741  BP: 110/63 (!) 117/59 117/67 (!) 121/52  Pulse: 64 71 (!) 59 62  Resp: 18 20 20 17   Temp: 98.4 F (36.9 C) 98.6 F (37 C) 98.5 F (36.9 C) 98 F (36.7 C)  TempSrc: Oral Oral Oral Oral  SpO2: 98% 95% 100% 93%  Weight:      Height:        Intake/Output Summary (Last 24 hours) at 12/06/2019 0803 Last data filed at 12/06/2019 0704 Gross per 24 hour  Intake 1020 ml  Output 4600 ml  Net -3580 ml   Filed Weights   12/02/19 0539 12/03/19 0448 12/05/19 0424  Weight: (!) 188.8 kg (!) 188.2 kg (!) 180 kg    Telemetry    SR - Personally  Reviewed  ECG    No  New tracings - Personally Reviewed  Physical Exam   GEN: No acute distress.   Neck: JVD difficult to assess secondary to body habitus. Cardiac: RRR, no murmurs, rubs, or gallops.  Respiratory: Clear to auscultation bilaterally.  GI: Soft, nontender, non-distended.   MS: Bilateral lower extremities wrapped with significant TTP of the left lower extremity; No deformity. Neuro:  Alert and oriented x 3; Nonfocal.  Psych: Normal affect.  Labs    Chemistry Recent Labs  Lab 12/04/19 0435 12/05/19 0407 12/06/19 0500  NA 135 135 132*  K 4.3 4.4 4.2  CL 98 96* 95*  CO2 30 28 28   GLUCOSE 114* 110* 115*  BUN 17 17 18   CREATININE 0.92 0.87 1.01  CALCIUM 8.3* 8.3* 8.6*  GFRNONAA >60 >60 >60  GFRAA >60 >60 >60  ANIONGAP 7 11 9      Hematology Recent Labs  Lab 12/04/19 0435 12/05/19 0407 12/06/19 0500  WBC 11.8* 11.3* 8.8  RBC 4.03* 4.45 4.28  HGB 8.7* 9.5* 9.1*  HCT 28.2* 30.2* 29.2*  MCV 70.0* 67.9* 68.2*  MCH 21.6* 21.3* 21.3*  MCHC 30.9 31.5 31.2  RDW 16.5* 16.7* 16.8*  PLT 305 322 278    Cardiac EnzymesNo results for input(s): TROPONINI in the last 168 hours. No results for input(s): TROPIPOC  in the last 168 hours.   BNPNo results for input(s): BNP, PROBNP in the last 168 hours.   DDimer No results for input(s): DDIMER in the last 168 hours.   Radiology    CT VENOGRAM ABD/PEL  Result Date: 12/05/2019 IMPRESSION: 1. Normal appearance of the venous structures in the abdomen and pelvis without compression, obstruction or thrombus. 2. Enlarged inguinal and pelvic lymph nodes, left side greater than right. In addition, there is subcutaneous edema in the left upper thigh. Lymphadenopathy could be reactive change from the chronic edema but this lymph node enlargement is nonspecific. If there is concern for a neoplastic process, the left inguinal lymph nodes would be amendable to biopsy. 3.  Aortic Atherosclerosis (ICD10-I70.0). 4. Umbilical hernia  containing fat. Minimal stranding associated with this umbilical hernia. Electronically Signed   By: Richarda Overlie M.D.   On: 12/05/2019 07:59    Cardiac Studies   2D echo 11/27/2019: 1. Left ventricular ejection fraction, by estimation, is 50 to 55%. The  left ventricle has low normal function. Left ventricular endocardial  border not optimally defined to evaluate regional wall motion. The left  ventricular internal cavity size was  mildly dilated. There is moderate left ventricular hypertrophy. Left  ventricular diastolic function could not be evaluated.  2. Right ventricular systolic function is normal. The right ventricular  size is normal. Tricuspid regurgitation signal is inadequate for assessing  PA pressure.  3. Left atrial size was mildly dilated.  4. The mitral valve is normal in structure. No evidence of mitral valve  regurgitation. No evidence of mitral stenosis.  5. The aortic valve is normal in structure. Aortic valve regurgitation is  not visualized. No aortic stenosis is present.  6. Very challenging study with limited views and significant tachycardia  during the exam.   Patient Profile     54 y.o. male with history of morbid obesity, lower extremity edema, and tobacco use admitted 8/20 with worsening lower extremity swelling, acute on chronic HFpEF, group B streptococcus bacteremia and subsequently developed atrial flutter on 8/21 with spontaneous conversion to sinus rhythm on 8/24  Assessment & Plan    1. Atrial flutter: -Developed 8/21 with spontaneous conversion to sinus 8/24, maintaining sinus rhythm -Now on amiodarone 200 mg bid x 1 week, then 200 mg daily thereafter, though ideally would not use this long term given his young age -Lopressor 50 mg bid -Continue heparin gtt for now -CHADS2VASc at least 2 (CHF, vascular disease) -Care manager has been consulted to assist with anticoagulation recommendations   2. Acute on chronic HFpEF: -Volume status is  difficult to assess on exam due to morbid obesity -Continue IV Lasix for today with possible transition to torsemide as noted by Dr. Mariah Milling on 8/31 note -Daily weights -Strict I/O -CHF education  3. Group B streptococcus bacteremia: -IV antibiotics per IM/ID -Potentially planning for TEE with Dr. Mariah Milling 9/2, will discuss with MD  4. Left leg pain: -Lower extremity ultrasound negative for DVT this admission, though imaging was suboptimal  -Consider CT for evaluation of abscess, defer to IM  For questions or updates, please contact [facility] Please consult [email or web address] for contact info under Cardiology/STEMI.    Signed, Eula Listen, PA-C [facility] Pager: [phone] 12/06/2019, 8:03 AM

## 2019-12-06 NOTE — Progress Notes (Signed)
ANTICOAGULATION CONSULT NOTE  Pharmacy Consult for heparin Indication: atrial flutter  No Known Allergies  Patient Measurements: Height: 6\' 7"  (200.7 cm) Weight: (!) 180 kg (396 lb 13.3 oz) IBW/kg (Calculated) : 93.7 Heparin Dosing Weight: 141 kg (heparin adjusted body weight), initially used 113 kg  Vital Signs: Temp: 99 F (37.2 C) (09/01 2116) Temp Source: Oral (09/01 2116) BP: 125/58 (09/01 2116) Pulse Rate: 71 (09/01 2116)  Labs: Recent Labs    12/04/19 0435 12/04/19 0435 12/05/19 0407 12/05/19 0407 12/06/19 0500 12/06/19 1430 12/06/19 2230  HGB 8.7*   < > 9.5*  --  9.1*  --   --   HCT 28.2*  --  30.2*  --  29.2*  --   --   PLT 305  --  322  --  278  --   --   HEPARINUNFRC 0.38   < > 0.61   < > 0.99* 0.29* 0.59  CREATININE 0.92  --  0.87  --  1.01  --   --    < > = values in this interval not displayed.    Estimated Creatinine Clearance: 153.4 mL/min (by C-G formula based on SCr of 1.01 mg/dL).   Medical History: Past Medical History:  Diagnosis Date  . Lower extremity edema   . Morbid obesity (HCC) 11/27/2019  . Tobacco abuse 11/27/2019    Assessment: 54 year old patient with strep group G bacteremia. Pt presented with atrial flutter with RVR on amiodarone and diltiazem. Pt is now NSR on amiodarone. PMH includes CHF. CBC stable. Pharmacy consulted for monitoring and adjustment of heparin drip.  8/28 2303 HL 0.12 - rate increased to 2900 units/hr 8/29 0614 HL 0.33  8/29 1227 HL 0.38  8/31 0407 HL 0.61 9/01 0500 HL 0.99 - rate decreased to 2500 units/hr 9/01 1430 HL 0.29 - rate increased to 2750 units/hr  9/01 2230 HL 0.59, therapeutic x 1, continue current rate  Goal of Therapy:  Heparin level 0.3-0.7 units/ml Monitor platelets by anticoagulation protocol: Yes   Plan:  HL is therapeutic. Continue Heparin at 2750 units/hr.  Will recheck HL in am to confirm.   11/01, PharmD 12/06/2019 11:14 PM

## 2019-12-06 NOTE — Progress Notes (Signed)
   Heart Failure Nurse Navigator Note.  He is awake and alert, lying in bed watching TV.  He denies any increasing SOB, PND or orthopnea.  States that he still does not have much of an appetite, although he feels is stomach is not as swollen as when he came in. He also feels that his right leg is normal in size but left is still somewhat swollen and tender to touch.  He remains on IV lasix.  Down 17 kg from admission weight.  He was given heart failure teaching booklet and magnet.  Discussed the colored fields-when to report symptoms to doctor. Also discussed that there are four videos on heart failure that I would like from him to view.  Discussed with his Comptroller.  He states that he does not have a scale at home for daily weights.  Instructed that we will see that he has one upon discharge.  He has not been up out of bed yet today.   Tresa Endo RN, CHFN

## 2019-12-07 ENCOUNTER — Encounter: Payer: Self-pay | Admitting: Internal Medicine

## 2019-12-07 ENCOUNTER — Inpatient Hospital Stay (HOSPITAL_COMMUNITY)
Admit: 2019-12-07 | Discharge: 2019-12-07 | Disposition: A | Discharge status: Home / Self Care | Payer: Self-pay | Attending: Physician Assistant | Admitting: Physician Assistant

## 2019-12-07 ENCOUNTER — Inpatient Hospital Stay: Payer: Self-pay

## 2019-12-07 ENCOUNTER — Encounter
Admission: EM | Disposition: A | Discharge status: Home Health | Payer: Self-pay | Source: Home / Self Care | Attending: Internal Medicine

## 2019-12-07 DIAGNOSIS — R7881 Bacteremia: Secondary | ICD-10-CM

## 2019-12-07 HISTORY — PX: TEE WITHOUT CARDIOVERSION: SHX5443

## 2019-12-07 LAB — HEPARIN LEVEL (UNFRACTIONATED)
Heparin Unfractionated: 0.11 IU/mL — ABNORMAL LOW (ref 0.30–0.70)
Heparin Unfractionated: 0.61 IU/mL (ref 0.30–0.70)
Heparin Unfractionated: 0.72 IU/mL — ABNORMAL HIGH (ref 0.30–0.70)

## 2019-12-07 LAB — BASIC METABOLIC PANEL
Anion gap: 10 (ref 5–15)
BUN: 17 mg/dL (ref 6–20)
CO2: 28 mmol/L (ref 22–32)
Calcium: 8.7 mg/dL — ABNORMAL LOW (ref 8.9–10.3)
Chloride: 96 mmol/L — ABNORMAL LOW (ref 98–111)
Creatinine, Ser: 1.06 mg/dL (ref 0.61–1.24)
GFR calc Af Amer: >60 mL/min (ref >60)
GFR calc non Af Amer: >60 mL/min (ref >60)
Glucose, Bld: 104 mg/dL — ABNORMAL HIGH (ref 70–99)
Potassium: 4.4 mmol/L (ref 3.5–5.1)
Sodium: 134 mmol/L — ABNORMAL LOW (ref 135–145)

## 2019-12-07 LAB — CBC
HCT: 30.7 % — ABNORMAL LOW (ref 39.0–52.0)
Hemoglobin: 9.4 g/dL — ABNORMAL LOW (ref 13.0–17.0)
MCH: 21.6 pg — ABNORMAL LOW (ref 26.0–34.0)
MCHC: 30.6 g/dL (ref 30.0–36.0)
MCV: 70.6 fL — ABNORMAL LOW (ref 80.0–100.0)
Platelets: 312 10*3/uL (ref 150–400)
RBC: 4.35 MIL/uL (ref 4.22–5.81)
RDW: 17.3 % — ABNORMAL HIGH (ref 11.5–15.5)
WBC: 7.4 10*3/uL (ref 4.0–10.5)
nRBC: 0.3 % — ABNORMAL HIGH (ref 0.0–0.2)

## 2019-12-07 SURGERY — ECHOCARDIOGRAM, TRANSESOPHAGEAL
Anesthesia: Moderate Sedation

## 2019-12-07 MED ORDER — FENTANYL CITRATE (PF) 100 MCG/2ML IJ SOLN
INTRAMUSCULAR | Status: AC | PRN
  Administered 2019-12-07 (×6): 25 ug via INTRAVENOUS

## 2019-12-07 MED ORDER — BUTAMBEN-TETRACAINE-BENZOCAINE 2-2-14 % EX AERO
INHALATION_SPRAY | CUTANEOUS | Status: AC
  Filled 2019-12-07: qty 5

## 2019-12-07 MED ORDER — SODIUM CHLORIDE 0.9 % IV SOLN: INTRAVENOUS | Status: DC

## 2019-12-07 MED ORDER — MIDAZOLAM HCL 5 MG/5ML IJ SOLN
INTRAMUSCULAR | Status: AC
  Filled 2019-12-07: qty 5

## 2019-12-07 MED ORDER — SODIUM CHLORIDE FLUSH 0.9 % IV SOLN
INTRAVENOUS | Status: AC
  Filled 2019-12-07: qty 10

## 2019-12-07 MED ORDER — FENTANYL CITRATE (PF) 100 MCG/2ML IJ SOLN
INTRAMUSCULAR | Status: AC
  Filled 2019-12-07: qty 2

## 2019-12-07 MED ORDER — LIDOCAINE VISCOUS HCL 2 % MT SOLN
OROMUCOSAL | Status: AC
  Filled 2019-12-07: qty 15

## 2019-12-07 MED ORDER — IOHEXOL 300 MG/ML  SOLN
125.0000 mL | Freq: Once | INTRAMUSCULAR | Status: AC | PRN
  Administered 2019-12-07: 125 mL via INTRAVENOUS

## 2019-12-07 MED ORDER — MIDAZOLAM HCL 2 MG/2ML IJ SOLN
INTRAMUSCULAR | Status: AC | PRN
  Administered 2019-12-07 (×6): 1 mg via INTRAVENOUS

## 2019-12-07 NOTE — Progress Notes (Signed)
Pt slept well during the shift, IV abx administered as ordered. Pt had dressing changed scheduled for 0500 hrs. Supplies brought to the room, but pt refused dressing change until later in the day. Educated but still stated dressing to be done later.

## 2019-12-07 NOTE — Progress Notes (Signed)
Patient picked up for TEE, report given to receiving RN, vs wnl, no distress noted at time of transfer. No pain. A.M meds given

## 2019-12-07 NOTE — Progress Notes (Signed)
ANTICOAGULATION CONSULT NOTE  Pharmacy Consult for heparin Indication: atrial flutter  No Known Allergies  Patient Measurements: Height: 6\' 7"  (200.7 cm) Weight: (!) 180 kg (396 lb 13.3 oz) IBW/kg (Calculated) : 93.7 Heparin Dosing Weight: 141 kg (heparin adjusted body weight), initially used 113 kg  Vital Signs: Temp: 97.9 F (36.6 C) (09/02 1131) Temp Source: Oral (09/02 0934) BP: 120/60 (09/02 1131) Pulse Rate: 56 (09/02 1131)  Labs: Recent Labs    12/05/19 0407 12/05/19 0407 12/06/19 0500 12/06/19 1430 12/06/19 2230 12/07/19 0614 12/07/19 1221  HGB 9.5*   < > 9.1*  --   --  9.4*  --   HCT 30.2*  --  29.2*  --   --  30.7*  --   PLT 322  --  278  --   --  312  --   HEPARINUNFRC 0.61   < > 0.99*   < > 0.59 0.11* 0.61  CREATININE 0.87  --  1.01  --   --  1.06  --    < > = values in this interval not displayed.    Estimated Creatinine Clearance: 146.1 mL/min (by C-G formula based on SCr of 1.06 mg/dL).   Medical History: Past Medical History:  Diagnosis Date  . Lower extremity edema   . Morbid obesity (HCC) 11/27/2019  . Tobacco abuse 11/27/2019    Assessment: 54 year old patient with strep group G bacteremia. Pt presented with atrial flutter with RVR on amiodarone and diltiazem. Pt is now NSR on amiodarone. PMH includes CHF. CBC stable. Pharmacy consulted for monitoring and adjustment of heparin drip.  8/28 2303 HL 0.12 - rate increased to 2900 units/hr 8/29 0614 HL 0.33 - therapeutic 8/29 1227 HL 0.38 - therapeutic 8/31 0407 HL 0.61- therapeutic 9/01 0500 HL 0.99 - rate decreased to 2500 units/hr 9/01 1430 HL 0.29 - rate increased to 2750 units/hr  9/01 2230 HL 0.59 - therapeutic x 1 - continue current rate of 2750 units/hr 9/02 0614 HL 0.11 - possible lab error? - recheck HL 9/02 1221 HL 0.61 - therapeutic - continue current rate 2750 units/hr  Goal of Therapy:  Heparin level 0.3-0.7 units/ml Monitor platelets by anticoagulation protocol: Yes   Plan:   - HL is therapeutic - continue Heparin rate of 2750 units/hr  - Recheck 6-hr HL due to HL of 0.11 0902 @ 0614 - Monitor CBC with AM labs   11/02, PharmD Pharmacy Resident  12/07/2019 1:05 PM

## 2019-12-07 NOTE — Progress Notes (Signed)
Transesophageal Echocardiogram :  Indication: Strep bacteremia Requesting/ordering  physician: Dr. Sampson Goon  Procedure: Benzocaine spray x2 and 2 mls x 2 of viscous lidocaine were given orally to provide local anesthesia to the oropharynx. The patient was positioned supine on the left side, bite block provided. The patient was moderately sedated with the doses of versed and fentanyl as detailed below.  Using digital technique an omniplane probe was advanced into the distal esophagus without incident.   Moderate sedation: 1. Sedation used:  Versed: 6 mg, Fentanyl: 150 mcg 2. Time administered:   10 AM  time when patient started recovery: 10:40 AM Total sedation time 40 minutes 3. I was face to face during this time  See report in EPIC  for complete details: In brief, No valvular endocarditis noted No left atrial appendage thrombus No significant aortic atherosclerosis noted limited windows  transgastric imaging revealed normal LV function with no RWMAs and no mural apical thrombus.  .  Estimated ejection fraction was 55%.  Right sided cardiac chambers were normal with no evidence of pulmonary hypertension.  Imaging of the septum showed no ASD or VSD Bubble study was negative for shunt 2D and color flow confirmed no PFO  The LA was well visualized in orthogonal views.  There was  no thrombus in the LA and LA appendage   The descending thoracic aorta had no  mural aortic debris with no evidence of aneurysmal dilation or disection   Julien Nordmann 12/07/2019 10:57 AM

## 2019-12-07 NOTE — Progress Notes (Signed)
Progress Note    [name]  AVW:[id number] DOB: 03-Jul-1965  DOA: 11/24/2019 PCP: Patient, No Pcp Per      Brief Narrative:    Medical records reviewed and are as summarized below:  Alfred [name] is a 54 y.o. male       Assessment/Plan:   Principal Problem:   Streptococcal sepsis, unspecified (HCC) Active Problems:   Cellulitis of left leg   AKI (acute kidney injury) (HCC)   Bacteremia   Lower extremity edema   Morbid obesity (HCC)   Tobacco abuse   Paroxysmal atrial fibrillation (HCC)   Acute on chronic heart failure with preserved ejection fraction (HFpEF) (HCC)   Atrial flutter (HCC)   Severe sepsis and group B strep bacteremia, left leg cellulitis  Atrial flutter with RVR (CHaDs2VASC score of 1)-converted to NSR  Significantly elevated D-dimer (1,759)-CT venogram, venous duplex and CTA did not show any evidence of venous thromboembolism.  Acute on chronic diastolic CHF  Hypokalemia-improved  Chronic venous insufficiency/venous stasis  Hepatitis C-outpatient follow-up for further management  Tobacco use disorder  AKI-resolved  Body mass index is 44.7 kg/m.  (Morbid obesity): This complicates overall care and prognosis   PLAN  S/p TEE today.  No evidence of vegetations. Continue IV Lasix and monitor BMP/electrolytes Obtain CT scan with contrast of the left leg and foot for further evaluation of pain in the left lower extremity. Continue IV Unasyn. Continue oral amiodarone for atrial flutter.   Diet Order            Diet heart healthy/carb modified Room service appropriate? Yes; Fluid consistency: Thin  Diet effective now                    Consultants:  Cardiologist  Infectious disease  Procedures:      Medications:   . amiodarone  200 mg Oral BID  . [START ON 12/13/2019] amiodarone  200 mg Oral Daily  . butamben-tetracaine-benzocaine      . Chlorhexidine Gluconate Cloth  6 each Topical Daily  .  diclofenac Sodium  2 g Topical QID  . fentaNYL      . fentaNYL      . furosemide  60 mg Intravenous BID  . lidocaine      . metoprolol tartrate  50 mg Oral BID  . midazolam      . midazolam      . polyethylene glycol  17 g Oral Daily  . potassium chloride  20 mEq Oral Daily  . senna  2 tablet Oral QHS  . sodium chloride flush  10-40 mL Intracatheter Q12H  . sodium chloride flush       Continuous Infusions: . sodium chloride 250 mL (12/07/19 1212)  . ampicillin-sulbactam (UNASYN) IV 3 g (12/07/19 1214)  . ciprofloxacin 200 mL/hr at 12/07/19 0006  . heparin 2,750 Units/hr (12/07/19 1244)  . sodium chloride       Anti-infectives (From admission, onward)   Start     Dose/Rate Route Frequency Ordered Stop   12/05/19 0000  Ampicillin-Sulbactam (UNASYN) 3 g in sodium chloride 0.9 % 100 mL IVPB        3 g 200 mL/hr over 30 Minutes Intravenous Every 6 hours 12/04/19 1622     11/30/19 2000  ciprofloxacin (CIPRO) IVPB 400 mg        400 mg 200 mL/hr over 60 Minutes Intravenous Every 12 hours 11/30/19 1715     11/30/19 1800  ceFAZolin (ANCEF)  IVPB 2g/100 mL premix  Status:  Discontinued        2 g 200 mL/hr over 30 Minutes Intravenous Every 8 hours 11/30/19 1715 12/04/19 1622   11/30/19 1800  clindamycin (CLEOCIN) IVPB 900 mg  Status:  Discontinued        900 mg 100 mL/hr over 30 Minutes Intravenous Every 8 hours 11/30/19 1715 12/04/19 1622   11/29/19 0600  Ampicillin-Sulbactam (UNASYN) 3 g in sodium chloride 0.9 % 100 mL IVPB  Status:  Discontinued        3 g 200 mL/hr over 30 Minutes Intravenous Every 6 hours 11/28/19 0918 11/30/19 1538   11/25/19 0800  cefTRIAXone (ROCEPHIN) 2 g in sodium chloride 0.9 % 100 mL IVPB  Status:  Discontinued        2 g 200 mL/hr over 30 Minutes Intravenous Every 24 hours 11/25/19 0544 11/28/19 0918   11/25/19 0600  vancomycin (VANCOREADY) IVPB 750 mg/150 mL  Status:  Discontinued        750 mg 150 mL/hr over 60 Minutes Intravenous Every 12 hours  11/24/19 2029 11/27/19 1525   11/24/19 2200  piperacillin-tazobactam (ZOSYN) IVPB 3.375 g  Status:  Discontinued        3.375 g 12.5 mL/hr over 240 Minutes Intravenous Every 8 hours 11/24/19 2026 11/25/19 0543   11/24/19 2015  piperacillin-tazobactam (ZOSYN) IVPB 3.375 g  Status:  Discontinued        3.375 g 100 mL/hr over 30 Minutes Intravenous  Once 11/24/19 2004 11/24/19 2026   11/24/19 2015  vancomycin (VANCOCIN) IVPB 1000 mg/200 mL premix  Status:  Discontinued        1,000 mg 200 mL/hr over 60 Minutes Intravenous  Once 11/24/19 2004 11/24/19 2025   11/24/19 1845  vancomycin (VANCOREADY) IVPB 2000 mg/400 mL        2,000 mg 200 mL/hr over 120 Minutes Intravenous  Once 11/24/19 1748 11/25/19 0130   11/24/19 1745  ceFEPIme (MAXIPIME) 2 g in sodium chloride 0.9 % 100 mL IVPB  Status:  Discontinued        2 g 200 mL/hr over 30 Minutes Intravenous Every 8 hours 11/24/19 1732 11/24/19 2025             Family Communication/Anticipated D/C date and plan/Code Status   DVT prophylaxis: IV heparin     Code Status: Full Code  Family Communication: Discussed with patient Disposition Plan:    Status is: Inpatient  Remains inpatient appropriate because:Ongoing diagnostic testing needed not appropriate for outpatient work up and Inpatient level of care appropriate due to severity of illness   Dispo: The patient is from: Home              Anticipated d/c is to: Home              Anticipated d/c date is: 3 days              Patient currently is not medically stable to d/c.           Subjective:   C/o pain in the left leg and foot.  Pain is quite severe and it's worse with movement  Objective:    Vitals:   12/07/19 1045 12/07/19 1100 12/07/19 1108 12/07/19 1131  BP: (!) 99/47 112/61 (!) 115/59 120/60  Pulse: (!) 56 (!) 56 (!) 55 (!) 56  Resp: (!) 27 (!) 26 (!) 30 19  Temp:    97.9 F (36.6 C)  TempSrc:  SpO2: 94% 95% 96% 99%  Weight:      Height:        No data found.   Intake/Output Summary (Last 24 hours) at 12/07/2019 1435 Last data filed at 12/07/2019 1350 Gross per 24 hour  Intake 1256.17 ml  Output 3375 ml  Net -2118.83 ml   Filed Weights   12/02/19 0539 12/03/19 0448 12/05/19 0424  Weight: (!) 188.8 kg (!) 188.2 kg (!) 180 kg    Exam:  GEN: No acute distress SKIN: Warm and dry EYES: Anicteric, no pallor ENT: MMM CV: Regular rate and rhythm. PULM: No rales heard. ABD: Soft, protuberant, nontender CNS: Alert, oriented x3.  No focal deficits. EXT: Dressing on bilateral legs is clean and dry.  Tenderness of the left foot and left leg.  Bilateral lower extremity edema is improving.   Data Reviewed:   I have personally reviewed following labs and imaging studies:  Labs: Labs show the following:   Basic Metabolic Panel: Recent Labs  Lab 12/01/19 0059 12/02/19 0532 12/03/19 4132 12/03/19 4401 12/04/19 0435 12/04/19 0435 12/05/19 0407 12/05/19 0407 12/06/19 0500 12/07/19 0614  NA 136   < > 132*  --  135  --  135  --  132* 134*  K 4.0   < > 3.8   < > 4.3   < > 4.4   < > 4.2 4.4  CL 96*   < > 94*  --  98  --  96*  --  95* 96*  CO2 29   < > 27  --  30  --  28  --  28 28  GLUCOSE 117*   < > 118*  --  114*  --  110*  --  115* 104*  BUN 14   < > 14  --  17  --  17  --  18 17  CREATININE 0.96   < > 0.90  --  0.92  --  0.87  --  1.01 1.06  CALCIUM 7.8*   < > 8.1*  --  8.3*  --  8.3*  --  8.6* 8.7*  MG 1.9  --  2.1  --   --   --   --   --   --   --    < > = values in this interval not displayed.   GFR Estimated Creatinine Clearance: 146.1 mL/min (by C-G formula based on SCr of 1.06 mg/dL). Liver Function Tests: No results for input(s): AST, ALT, ALKPHOS, BILITOT, PROT, ALBUMIN in the last 168 hours. No results for input(s): LIPASE, AMYLASE in the last 168 hours. No results for input(s): AMMONIA in the last 168 hours. Coagulation profile No results for input(s): INR, PROTIME in the last 168  hours.  CBC: Recent Labs  Lab 12/02/19 0532 12/02/19 0532 12/03/19 0272 12/04/19 0435 12/05/19 0407 12/06/19 0500 12/07/19 0614  WBC 12.3*   < > 11.4* 11.8* 11.3* 8.8 7.4  NEUTROABS 9.8*  --  9.0* 9.2* 8.7* 6.6  --   HGB 8.5*   < > 9.1* 8.7* 9.5* 9.1* 9.4*  HCT 26.8*   < > 29.0* 28.2* 30.2* 29.2* 30.7*  MCV 68.0*   < > 68.4* 70.0* 67.9* 68.2* 70.6*  PLT 252   < > 245 305 322 278 312   < > = values in this interval not displayed.   Cardiac Enzymes: No results for input(s): CKTOTAL, CKMB, CKMBINDEX, TROPONINI in the last 168 hours. BNP (last 3 results) No results  for input(s): PROBNP in the last 8760 hours. CBG: No results for input(s): GLUCAP in the last 168 hours. D-Dimer: No results for input(s): DDIMER in the last 72 hours. Hgb A1c: No results for input(s): HGBA1C in the last 72 hours. Lipid Profile: No results for input(s): CHOL, HDL, LDLCALC, TRIG, CHOLHDL, LDLDIRECT in the last 72 hours. Thyroid function studies: No results for input(s): TSH, T4TOTAL, T3FREE, THYROIDAB in the last 72 hours.  Invalid input(s): FREET3 Anemia work up: No results for input(s): VITAMINB12, FOLATE, FERRITIN, TIBC, IRON, RETICCTPCT in the last 72 hours. Sepsis Labs: Recent Labs  Lab 12/04/19 0435 12/05/19 0407 12/06/19 0500 12/07/19 0614  WBC 11.8* 11.3* 8.8 7.4    Microbiology Recent Results (from the past 240 hour(s))  Aerobic Culture (superficial specimen)     Status: None   Collection Time: 11/27/19  4:50 PM   Specimen: Wound  Result Value Ref Range Status   Specimen Description   Final    WOUND Performed at [facility], [address], [place], [place] [address]    Special Requests   Final    LEFT LEG/SHIN Performed at [facility], [address] [place], [place] [address]    Gram Stain   Final    RARE WBC PRESENT,BOTH PMN AND MONONUCLEAR MODERATE GRAM POSITIVE COCCI FEW GRAM NEGATIVE RODS    Culture   Final    MODERATE PSEUDOMONAS  AERUGINOSA FEW STREPTOCOCCUS GROUP G Beta hemolytic streptococci are predictably susceptible to penicillin and other beta lactams. Susceptibility testing not routinely performed. Performed at [facility], [address], [place], [place] [address]    Report Status 12/02/2019 FINAL  Final   Organism ID, Bacteria PSEUDOMONAS AERUGINOSA  Final      Susceptibility   Pseudomonas aeruginosa - MIC*    CEFTAZIDIME >=64 RESISTANT Resistant     CIPROFLOXACIN <=0.25 SENSITIVE Sensitive     GENTAMICIN <=1 SENSITIVE Sensitive     IMIPENEM 2 SENSITIVE Sensitive     * MODERATE PSEUDOMONAS AERUGINOSA  Virus culture     Status: None   Collection Time: 11/27/19  4:50 PM   Specimen: Leg, Left; Wound  Result Value Ref Range Status   Viral Culture No virus isolated.  Corrected    Comment: (NOTE) Please indicate source of specimen on all future request forms. Performed At: [facility] [address] [place], [place] [id number] Jolene Schimke MD OZ:[id number] CORRECTED ON 09/01 AT 1036: PREVIOUSLY REPORTED AS Comment    Source of Sample WD LEFT SHIN  Final    Comment: Performed at [facility], [address] [place], [place] [address]  Culture, blood (single) w Reflex to ID Panel     Status: None   Collection Time: 11/28/19  1:20 AM   Specimen: BLOOD  Result Value Ref Range Status   Specimen Description BLOOD RIGHT ANTECUBITAL  Final   Special Requests   Final    BOTTLES DRAWN AEROBIC AND ANAEROBIC Blood Culture results may not be optimal due to an inadequate volume of blood received in culture bottles   Culture   Final    NO GROWTH 5 DAYS Performed at [facility], [address] [place], [place] [address]    Report Status 12/03/2019 FINAL  Final  CULTURE, BLOOD (ROUTINE X 2) w Reflex to ID Panel     Status: None   Collection Time: 12/01/19 12:49 AM   Specimen: BLOOD  Result Value Ref Range Status   Specimen Description BLOOD RFOA  Final   Special  Requests BOTTLES DRAWN AEROBIC AND ANAEROBIC BCAV  Final   Culture   Final    NO GROWTH 5 DAYS Performed at [facility], [address] [place], [place] [address]    Report Status 12/06/2019 FINAL  Final  CULTURE, BLOOD (ROUTINE X 2) w Reflex to ID Panel     Status: None   Collection Time: 12/01/19  1:05 AM   Specimen: BLOOD  Result Value Ref Range Status   Specimen Description BLOOD LEFT HAND  Final   Special Requests BOTTLES DRAWN AEROBIC AND ANAEROBIC BCAV  Final   Culture   Final    NO GROWTH 5 DAYS Performed at [facility], [address] [place], [place] [address]    Report Status 12/06/2019 FINAL  Final    Procedures and diagnostic studies:  ECHO TEE  Result Date: 12/27/19    TRANSESOPHOGEAL ECHO REPORT   Patient Name:   Alfred [name] Drennen Date of Exam: 12/27/2019 Medical Rec #:  [id number]            Height:       79.0 in Accession #:    [id number]           Weight:       396.8 lb Date of Birth:  1965-12-30            BSA:          3.049 m Patient Age:    54 years             BP:           114/60 mmHg Patient Gender: M                    HR:           62 bpm. Exam Location:  [facility] Procedure: Transesophageal Echo, Color Doppler, Cardiac Doppler and Saline            Contrast Bubble Study Indications:     R78.81 Bacteremia  History:         Patient has prior history of Echocardiogram examinations, most                  recent 11/27/2019.  Sonographer:     Humphrey Rolls RDCS (AE) Referring Phys:  [id number] Sondra Barges Diagnosing Phys: Julien Nordmann MD PROCEDURE: After discussion of the risks and benefits of a TEE, an informed consent was obtained from the patient. TEE procedure time was 40 minutes. The transesophogeal probe was passed without difficulty through the esophogus of the patient. Imaged were obtained with the patient in a left lateral decubitus position. Local oropharyngeal anesthetic was provided with Cetacaine and Benzocaine spray. Sedation performed by  performing physician. Image quality was excellent. The patient's vital signs; including heart rate, blood pressure, and oxygen saturation; remained stable throughout the procedure. The patient developed no complications during the procedure. IMPRESSIONS  1. Left ventricular ejection fraction, by estimation, is 55 . The left ventricle has normal function. The left ventricle has no regional wall motion abnormalities.  2. Right ventricular systolic function is normal. The right ventricular size is normal.  3. Left atrial size was mildly dilated. No left atrial/left atrial appendage thrombus was detected.  4. No valve vegetation noted.  5. Picc line catheter noted in the right atrium. Conclusion(s)/Recommendation(s): Normal biventricular function without evidence of hemodynamically significant valvular heart disease. FINDINGS  Left Ventricle: Left ventricular ejection fraction, by estimation, is  55 to 60%. The left ventricle has normal function. The left ventricle has no regional wall motion abnormalities. The left ventricular internal cavity size was normal in size. There is  no left ventricular hypertrophy. Right Ventricle: The right ventricular size is normal. No increase in right ventricular wall thickness. Right ventricular systolic function is normal. Left Atrium: Left atrial size was mildly dilated. No left atrial/left atrial appendage thrombus was detected. Right Atrium: Right atrial size was normal in size. Pericardium: There is no evidence of pericardial effusion. Mitral Valve: The mitral valve is normal in structure. Normal mobility of the mitral valve leaflets. Trivial mitral valve regurgitation. No evidence of mitral valve stenosis. Tricuspid Valve: The tricuspid valve is normal in structure. Tricuspid valve regurgitation is not demonstrated. No evidence of tricuspid stenosis. Aortic Valve: The aortic valve is normal in structure. Aortic valve regurgitation is not visualized. No aortic stenosis is present.  Pulmonic Valve: The pulmonic valve was normal in structure. Pulmonic valve regurgitation is not visualized. No evidence of pulmonic stenosis. Aorta: The aortic root is normal in size and structure. Venous: The inferior vena cava is normal in size with greater than 50% respiratory variability, suggesting right atrial pressure of 3 mmHg. IAS/Shunts: No atrial level shunt detected by color flow Doppler. Agitated saline contrast was given intravenously to evaluate for intracardiac shunting. No ventricular septal defect is seen or detected. There is no evidence of an atrial septal defect. Julien Nordmannimothy Gollan MD Electronically signed by Julien Nordmannimothy Gollan MD Signature Date/Time: 12/07/2019/1:52:09 PM    Final                LOS: 13 days   Kahari Critzer  [facility]   Pager on [email or web address]. If 7PM-7AM, please contact night-coverage at [email or web address]     12/07/2019, 2:35 PM

## 2019-12-07 NOTE — Progress Notes (Signed)
Received pt back post TEE, vs wnl as charted, no c/o pain noted.

## 2019-12-07 NOTE — Progress Notes (Signed)
*  PRELIMINARY RESULTS* Echocardiogram Echocardiogram Transesophageal has been performed.  Alfred [name] 12/07/2019, 10:47 AM

## 2019-12-07 NOTE — Progress Notes (Signed)
Progress Note  Patient Name: Alfred [name] Alfred [name] Date of Encounter: 12/07/2019  [facility] Cardiologist: [name], MD   Subjective   Slowly moving more, trying to spend more time sitting in a recliner Left leg particularly tender below the left knee, around the wrap, Abdomen still distended, left upper thigh edematous Overall feels he is improving Nurses report when his leg was weeping at home, dog would like his leg  Inpatient Medications    Scheduled Meds: . amiodarone  200 mg Oral BID  . [START ON 12/13/2019] amiodarone  200 mg Oral Daily  . butamben-tetracaine-benzocaine      . Chlorhexidine Gluconate Cloth  6 each Topical Daily  . diclofenac Sodium  2 g Topical QID  . fentaNYL      . fentaNYL      . furosemide  60 mg Intravenous BID  . lidocaine      . metoprolol tartrate  50 mg Oral BID  . midazolam      . midazolam      . polyethylene glycol  17 g Oral Daily  . potassium chloride  20 mEq Oral Daily  . senna  2 tablet Oral QHS  . sodium chloride flush  10-40 mL Intracatheter Q12H  . sodium chloride flush       Continuous Infusions: . sodium chloride 250 mL (12/07/19 0555)  . ampicillin-sulbactam (UNASYN) IV 3 g (12/07/19 0556)  . ciprofloxacin 200 mL/hr at 12/07/19 0006  . heparin 2,750 Units/hr (12/07/19 0551)  . sodium chloride     PRN Meds: sodium chloride, acetaminophen **OR** acetaminophen, albuterol, bisacodyl, guaiFENesin, HYDROcodone-acetaminophen, ipratropium-albuterol, melatonin, morphine injection, ondansetron **OR** ondansetron (ZOFRAN) IV, sodium chloride flush   Vital Signs    Vitals:   12/07/19 1040 12/07/19 1045 12/07/19 1100 12/07/19 1108  BP: (!) 99/50 (!) 99/47 112/61 (!) 115/59  Pulse: (!) 58 (!) 56 (!) 56 (!) 55  Resp: (!) 28 (!) 27 (!) 26 (!) 30  Temp:      TempSrc:      SpO2: 98% 94% 95% 96%  Weight:      Height:        Intake/Output Summary (Last 24 hours) at 12/07/2019 1128 Last data filed at 12/07/2019 0815 Gross per  24 hour  Intake 1016.17 ml  Output 3275 ml  Net -2258.83 ml   Last 3 Weights 12/05/2019 12/03/2019 12/02/2019  Weight (lbs) 396 lb 13.3 oz 414 lb 14.5 oz 416 lb 3.7 oz  Weight (kg) 180 kg 188.2 kg 188.8 kg      Telemetry    Normal sinus rhythm- Personally Reviewed  ECG     - Personally Reviewed  Physical Exam   Constitutional:  oriented to person, place, and time. No distress.  HENT:  Head: Grossly normal Eyes:  no discharge. No scleral icterus.  Neck: No JVD, no carotid bruits  Cardiovascular: Regular rate and rhythm, no murmurs appreciated Pulmonary/Chest: Clear to auscultation bilaterally, no wheezes or rails Abdominal: Soft.  no distension.  no tenderness.  Musculoskeletal: Normal range of motion Neurological:  normal muscle tone. Coordination normal. No atrophy Skin: Skin warm and dry Psychiatric: normal affect, pleasant  Labs    High Sensitivity Troponin:   Recent Labs  Lab 11/26/19 1811 11/26/19 2000  TROPONINIHS 27* 27*      Chemistry Recent Labs  Lab 12/05/19 0407 12/06/19 0500 12/07/19 0614  NA 135 132* 134*  K 4.4 4.2 4.4  CL 96* 95* 96*  CO2 28 28 28   GLUCOSE  110* 115* 104*  BUN 17 18 17   CREATININE 0.87 1.01 1.06  CALCIUM 8.3* 8.6* 8.7*  GFRNONAA >60 >60 >60  GFRAA >60 >60 >60  ANIONGAP 11 9 10      Hematology Recent Labs  Lab 12/05/19 0407 12/06/19 0500 12/07/19 0614  WBC 11.3* 8.8 7.4  RBC 4.45 4.28 4.35  HGB 9.5* 9.1* 9.4*  HCT 30.2* 29.2* 30.7*  MCV 67.9* 68.2* 70.6*  MCH 21.3* 21.3* 21.6*  MCHC 31.5 31.2 30.6  RDW 16.7* 16.8* 17.3*  PLT 322 278 312    BNPNo results for input(s): BNP, PROBNP in the last 168 hours.   DDimer No results for input(s): DDIMER in the last 168 hours.   Radiology    No results found.  Cardiac Studies   Echo  1. Left ventricular ejection fraction, by estimation, is 50 to 55%. The  left ventricle has low normal function. Left ventricular endocardial  border not optimally defined to  evaluate regional wall motion. The left  ventricular internal cavity size was  mildly dilated. There is moderate left ventricular hypertrophy. Left  ventricular diastolic function could not be evaluated.  2. Right ventricular systolic function is normal. The right ventricular  size is normal. Tricuspid regurgitation signal is inadequate for assessing  PA pressure.    Patient Profile     54 y.o. male with history of morbid obesity, tobacco abuse, admitted due to edema found to have atrial flutter on 11/2019.  Spontaneously converted to sinus rhythm on 11/2022.  Assessment & Plan    Atrial flutter, typical Converted to normal sinus rhythm August 24, Amiodarone 200 twice daily, metoprolol 50 twice daily Still on heparin infusion --Will need case manager consult to determine whether he will qualify for medical management, Eliquis or if he needs to be placed on warfarin  Acute on chronic diastolic CHF In the setting of arrhythmia, morbid obesity, suspected sleep apnea Presenting with massive edema, = -29 L so far Normal renal function still, would continue Lasix IV given continued abdominal distention, left leg edema -For climbing BUN and creatinine will transition to torsemide 40 daily with extra 40 for 3 pound weight gain  Streptococcus group B bacteremia On broad-spectrum antibiotics, followed by ID  transesophageal echo with no endocarditis/valve vegetation noted Tremendous pain left lower extremity below the knee, the pain has been improving with diuresis -It appears imaging has been ordered via CT of his left lower extremity  Left leg pain Enlarged inguinal and pelvic lymph nodes left side greater than right, subcutaneous edema left upper thigh,  Continued CHF discussion, discussed with CHF navigator, needs a scale at discharge, There is concerned about medication noncompliance or even getting his medications, needs case manager consult given no insurance, need to be signed up  to medical management He will need anticoagulation and close follow-up in clinic  Total encounter time more than 35 minutes  Greater than 50% was spent in counseling and coordination of care with the patient  For questions or updates, please contact [facility] Please consult [email or web address] for contact info under    Signed, 12/2022, MD  12/07/2019, 11:28 AM

## 2019-12-07 NOTE — Progress Notes (Signed)
ANTICOAGULATION CONSULT NOTE  Pharmacy Consult for heparin Indication: atrial flutter  No Known Allergies  Patient Measurements: Height: 6\' 7"  (200.7 cm) Weight: (!) 180 kg (396 lb 13.3 oz) IBW/kg (Calculated) : 93.7 Heparin Dosing Weight: 141 kg (heparin adjusted body weight), initially used 113 kg  Vital Signs: Temp: 97.8 F (36.6 C) (09/02 1544) Temp Source: Oral (09/02 0934) BP: 121/59 (09/02 1544) Pulse Rate: 67 (09/02 1544)  Labs: Recent Labs    12/05/19 0407 12/05/19 0407 12/06/19 0500 12/06/19 1430 12/07/19 0614 12/07/19 1221 12/07/19 1744  HGB 9.5*   < > 9.1*  --  9.4*  --   --   HCT 30.2*  --  29.2*  --  30.7*  --   --   PLT 322  --  278  --  312  --   --   HEPARINUNFRC 0.61   < > 0.99*   < > 0.11* 0.61 0.72*  CREATININE 0.87  --  1.01  --  1.06  --   --    < > = values in this interval not displayed.    Estimated Creatinine Clearance: 146.1 mL/min (by C-G formula based on SCr of 1.06 mg/dL).   Medical History: Past Medical History:  Diagnosis Date  . Lower extremity edema   . Morbid obesity (HCC) 11/27/2019  . Tobacco abuse 11/27/2019    Assessment: 54 year old patient with strep group G bacteremia. Pt presented with atrial flutter with RVR on amiodarone and diltiazem. Pt is now NSR on amiodarone. PMH includes CHF. CBC stable. Pharmacy consulted for monitoring and adjustment of heparin drip.  8/28 2303 HL 0.12 - rate increased to 2900 units/hr 8/29 0614 HL 0.33 - therapeutic 8/29 1227 HL 0.38 - therapeutic 8/31 0407 HL 0.61- therapeutic 9/01 0500 HL 0.99 - rate decreased to 2500 units/hr 9/01 1430 HL 0.29 - rate increased to 2750 units/hr  9/01 2230 HL 0.59 - therapeutic x 1 - continue current rate of 2750 units/hr 9/02 0614 HL 0.11 - possible lab error? - recheck HL 9/02 1221 HL 0.61 - therapeutic - continue current rate 2750 units/hr   Goal of Therapy:  Heparin level 0.3-0.7 units/ml Monitor platelets by anticoagulation protocol: Yes    Plan:  - HL 0.72. Level is supratherapeutic - Will reduced Heparin rate of 2600 units/hr  - Recheck 6-hr HL  - Monitor CBC with AM labs   11/02, PharmD, BCPS Clinical Pharmacist 12/07/2019 7:12 PM

## 2019-12-07 NOTE — Progress Notes (Signed)
   Spoke with patient regarding getting to doctors appointment after discharge from the hospital.  He states transportation would be a problem as the persons that he lives with all work days.  Care manager made aware.  He viewed two heart failure videos today.  What is heart failure and dally weights.  By teach back method he was able to explain the routine of daily weights, going to the bathroom,  weighing with the same amount of clothes and recording.  He states that he feels his appetite is increasing.   Tresa Endo RN,CHFN

## 2019-12-07 NOTE — Progress Notes (Signed)
PT Cancellation Note  Patient Details Name: Alfred [name] MRN: [id number] DOB: 1965/08/24   Cancelled Treatment:     PT attempt. Pt off floor for TEE. PT will return at later time/date.   Rushie Chestnut 12/07/2019, 12:33 PM

## 2019-12-07 NOTE — Progress Notes (Signed)
Progress Note  Patient Name: Alfred [name] Alfred [name] Date of Encounter: 12/07/2019  Primary Cardiologist: Kirke Corin  Subjective   Maintaining sinus rhythm. No chest pain or dyspnea. He continues to report left leg pain. Net - 29 L for the admission with a documented - 4 L for the past 24 hours. Intake is minimally noted. He is for TEE this morning.   Inpatient Medications    Scheduled Meds: . amiodarone  200 mg Oral BID  . [START ON 12/13/2019] amiodarone  200 mg Oral Daily  . Chlorhexidine Gluconate Cloth  6 each Topical Daily  . diclofenac Sodium  2 g Topical QID  . furosemide  60 mg Intravenous BID  . metoprolol tartrate  50 mg Oral BID  . polyethylene glycol  17 g Oral Daily  . potassium chloride  20 mEq Oral Daily  . senna  2 tablet Oral QHS  . sodium chloride flush  10-40 mL Intracatheter Q12H   Continuous Infusions: . sodium chloride 250 mL (12/07/19 0555)  . ampicillin-sulbactam (UNASYN) IV 3 g (12/07/19 0556)  . ciprofloxacin 200 mL/hr at 12/07/19 0006  . heparin 2,750 Units/hr (12/07/19 0551)  . sodium chloride     PRN Meds: sodium chloride, acetaminophen **OR** acetaminophen, albuterol, bisacodyl, guaiFENesin, HYDROcodone-acetaminophen, ipratropium-albuterol, melatonin, morphine injection, ondansetron **OR** ondansetron (ZOFRAN) IV, sodium chloride flush   Vital Signs    Vitals:   12/06/19 0741 12/06/19 1113 12/06/19 1516 12/06/19 2116  BP: (!) 121/52 (!) 109/59 116/67 (!) 125/58  Pulse: 62 (!) 58 (!) 58 71  Resp: 17 16 18 20   Temp: 98 F (36.7 C) 98 F (36.7 C) 98.4 F (36.9 C) 99 F (37.2 C)  TempSrc: Oral Oral Oral Oral  SpO2: 93% 100% 97% 95%  Weight:      Height:        Intake/Output Summary (Last 24 hours) at 12/07/2019 0732 Last data filed at 12/07/2019 0600 Gross per 24 hour  Intake 1016.17 ml  Output 4250 ml  Net -3233.83 ml   Filed Weights   12/02/19 0539 12/03/19 0448 12/05/19 0424  Weight: (!) 188.8 kg (!) 188.2 kg (!) 180 kg    Telemetry     SR - Personally Reviewed  ECG    No  New tracings - Personally Reviewed  Physical Exam   GEN: No acute distress.   Neck: JVD difficult to assess secondary to body habitus. Cardiac: RRR, no murmurs, rubs, or gallops.  Respiratory: Clear to auscultation bilaterally.  GI: Soft, nontender, non-distended.   MS: Bilateral lower extremities wrapped with significant TTP of the left lower extremity; No deformity. Neuro:  Alert and oriented x 3; Nonfocal.  Psych: Normal affect.  Labs    Chemistry Recent Labs  Lab 12/04/19 0435 12/05/19 0407 12/06/19 0500  NA 135 135 132*  K 4.3 4.4 4.2  CL 98 96* 95*  CO2 30 28 28   GLUCOSE 114* 110* 115*  BUN 17 17 18   CREATININE 0.92 0.87 1.01  CALCIUM 8.3* 8.3* 8.6*  GFRNONAA >60 >60 >60  GFRAA >60 >60 >60  ANIONGAP 7 11 9      Hematology Recent Labs  Lab 12/05/19 0407 12/06/19 0500 12/07/19 0614  WBC 11.3* 8.8 7.4  RBC 4.45 4.28 4.35  HGB 9.5* 9.1* 9.4*  HCT 30.2* 29.2* 30.7*  MCV 67.9* 68.2* 70.6*  MCH 21.3* 21.3* 21.6*  MCHC 31.5 31.2 30.6  RDW 16.7* 16.8* 17.3*  PLT 322 278 312    Cardiac EnzymesNo results for input(s): TROPONINI  in the last 168 hours. No results for input(s): TROPIPOC in the last 168 hours.   BNPNo results for input(s): BNP, PROBNP in the last 168 hours.   DDimer No results for input(s): DDIMER in the last 168 hours.   Radiology    CT VENOGRAM ABD/PEL  Result Date: 12/05/2019 IMPRESSION: 1. Normal appearance of the venous structures in the abdomen and pelvis without compression, obstruction or thrombus. 2. Enlarged inguinal and pelvic lymph nodes, left side greater than right. In addition, there is subcutaneous edema in the left upper thigh. Lymphadenopathy could be reactive change from the chronic edema but this lymph node enlargement is nonspecific. If there is concern for a neoplastic process, the left inguinal lymph nodes would be amendable to biopsy. 3.  Aortic Atherosclerosis (ICD10-I70.0). 4.  Umbilical hernia containing fat. Minimal stranding associated with this umbilical hernia. Electronically Signed   By: Richarda Overlie M.D.   On: 12/05/2019 07:59    Cardiac Studies   2D echo 11/27/2019: 1. Left ventricular ejection fraction, by estimation, is 50 to 55%. The  left ventricle has low normal function. Left ventricular endocardial  border not optimally defined to evaluate regional wall motion. The left  ventricular internal cavity size was  mildly dilated. There is moderate left ventricular hypertrophy. Left  ventricular diastolic function could not be evaluated.  2. Right ventricular systolic function is normal. The right ventricular  size is normal. Tricuspid regurgitation signal is inadequate for assessing  PA pressure.  3. Left atrial size was mildly dilated.  4. The mitral valve is normal in structure. No evidence of mitral valve  regurgitation. No evidence of mitral stenosis.  5. The aortic valve is normal in structure. Aortic valve regurgitation is  not visualized. No aortic stenosis is present.  6. Very challenging study with limited views and significant tachycardia  during the exam.   Patient Profile     54 y.o. male with history of morbid obesity, lower extremity edema, and tobacco use admitted 8/20 with worsening lower extremity swelling, acute on chronic HFpEF, group B streptococcus bacteremia and subsequently developed atrial flutter on 8/21 with spontaneous conversion to sinus rhythm on 8/24  Assessment & Plan    1. Atrial flutter: -Developed 8/21 with spontaneous conversion to sinus 8/24, maintaining sinus rhythm -Now on amiodarone 200 mg bid x 1 week, then 200 mg daily thereafter, though ideally would not use this long term given his young age -Lopressor 50 mg bid -Continue heparin gtt for now -CHADS2VASc at least 2 (CHF, vascular disease) -Care manager has been consulted to assist with anticoagulation recommendations   2. Acute on chronic  HFpEF: -Volume status is difficult to assess on exam due to morbid obesity -Continue IV Lasix for today with transition to torsemide as noted by Dr. Mariah Milling on 8/31 note -Daily weights -Strict I/O -CHF education  3. Group B streptococcus bacteremia: -IV antibiotics per IM/ID -He is for TEE with Dr. Mariah Milling today  4. Left leg pain: -Lower extremity ultrasound negative for DVT this admission, though imaging was suboptimal  -Consider CT for evaluation of abscess, defer to IM   For questions or updates, please contact [facility] Please consult [email or web address] for contact info under Cardiology/STEMI.    Signed, Eula Listen, PA-C [facility] Pager: [phone] 12/07/2019, 7:32 AM

## 2019-12-08 DIAGNOSIS — R000 Tachycardia, unspecified: Secondary | ICD-10-CM

## 2019-12-08 LAB — BASIC METABOLIC PANEL
Anion gap: 10 (ref 5–15)
BUN: 18 mg/dL (ref 6–20)
CO2: 26 mmol/L (ref 22–32)
Calcium: 8.9 mg/dL (ref 8.9–10.3)
Chloride: 98 mmol/L (ref 98–111)
Creatinine, Ser: 1.08 mg/dL (ref 0.61–1.24)
GFR calc Af Amer: >60 mL/min (ref >60)
GFR calc non Af Amer: >60 mL/min (ref >60)
Glucose, Bld: 105 mg/dL — ABNORMAL HIGH (ref 70–99)
Potassium: 4.4 mmol/L (ref 3.5–5.1)
Sodium: 134 mmol/L — ABNORMAL LOW (ref 135–145)

## 2019-12-08 LAB — HEPARIN LEVEL (UNFRACTIONATED)
Heparin Unfractionated: 0.83 IU/mL — ABNORMAL HIGH (ref 0.30–0.70)
Heparin Unfractionated: 0.84 IU/mL — ABNORMAL HIGH (ref 0.30–0.70)

## 2019-12-08 LAB — MAGNESIUM: Magnesium: 2.3 mg/dL (ref 1.7–2.4)

## 2019-12-08 MED ORDER — CEPASTAT 14.5 MG MT LOZG: 1.0000 | LOZENGE | OROMUCOSAL | Status: DC | PRN

## 2019-12-08 MED ORDER — CIPROFLOXACIN HCL 500 MG PO TABS
500.0000 mg | ORAL_TABLET | Freq: Two times a day (BID) | ORAL | Status: DC
  Administered 2019-12-09 – 2019-12-11 (×5): 500 mg via ORAL
  Filled 2019-12-08 (×7): qty 1

## 2019-12-08 MED ORDER — APIXABAN 5 MG PO TABS
5.0000 mg | ORAL_TABLET | Freq: Two times a day (BID) | ORAL | Status: DC
  Administered 2019-12-08 – 2019-12-11 (×7): 5 mg via ORAL
  Filled 2019-12-08 (×8): qty 1

## 2019-12-08 NOTE — Progress Notes (Signed)
Physical Therapy Treatment Patient Details Name: Alfred [name] MRN: [id number] DOB: July 30, 1965 Today's Date: 12/08/2019    History of Present Illness Alfred [name] is a 54yoM who comes to [facility] on 8/20 c pain and LEE. PMH: chronic BLE venous statis, hepatits. Pt admitted with BLE cellulitis. Since arrival pt having issues with fever and tachy into 140s-160s. At time of evaluation, pt continues to have significant LEE bilat.    PT Comments    Pt was in bed upon arriving. He agrees to PT session and is cooperative and motivated throughout. He required min assist to exit L side of bed but mod assist to return after gait training. Pt was able to stand from elevated bed height with min assist + 2nd person close by for safety. Vcs for hand placement, fwd wt shift, and improved safety. He ambulated 1 x 5 ft to recliner then 2 x ~ 20 ft prior to ambulating to RN station from doorway of his room. He overall tolerated well but is deconditioned. Will benefit from increased OOB activity. Acute PT will continue to follow and progress as able per POC. Recommend SNF however pt does is unable to go. He will benefit from continued skilled PT to address strength, balance, and safe functional mobility deficits.    Follow Up Recommendations  SNF;Other (comment) (pt will have to DC home with [facility] due to insurance)     Equipment Recommendations  Other (comment) (pt recieved personal bariatric RW during session)    Recommendations for Other Services       Precautions / Restrictions Precautions Precautions: Fall Precaution Comments: bilat leg wounds, Left knee pain, LLE buckling Restrictions Weight Bearing Restrictions: No    Mobility  Bed Mobility Overal bed mobility: Needs Assistance Bed Mobility: Supine to Sit;Sit to Supine     Supine to sit: Min assist;HOB elevated Sit to supine: Mod assist   General bed mobility comments: assisted LLE to get OOB but BLEs assisted to return to supine. HOB was  elevated  Transfers Overall transfer level: Needs assistance Equipment used: Rolling walker (2 wheeled) Transfers: Sit to/from Stand Sit to Stand: From elevated surface;Min assist;+2 safety/equipment         General transfer comment: pt demonstrated much improved abilityt o STS this date. min assist + vcs for safety and technique  Ambulation/Gait Ambulation/Gait assistance: +2 safety/equipment;Min guard Gait Distance (Feet): 40 Feet Assistive device: Rolling walker (2 wheeled) (bariatric) Gait Pattern/deviations: Trunk flexed;Step-to pattern;Antalgic Gait velocity: decreased   General Gait Details: pt was able to ambulate 4 x during session. chair follow for safety. pt ambulated 2 x 20, 1 x 5, and 1 x 40 ft. tolerated well btu does fatigue quickly during ambulation. LLER strength is improving however continues to be limiting.    Stairs             Wheelchair Mobility    Modified Rankin (Stroke Patients Only)       Balance Overall balance assessment: Needs assistance Sitting-balance support: Feet supported Sitting balance-Leahy Scale: Good Sitting balance - Comments: no LOB in sitting with feet support only   Standing balance support: During functional activity;Bilateral upper extremity supported Standing balance-Leahy Scale: Fair Standing balance comment: heavy reliance on BUE on RW however pt was steady on feet once in standing.                            Cognition Arousal/Alertness: Awake/alert Behavior During Therapy: WFL for tasks assessed/performed  Overall Cognitive Status: Within Functional Limits for tasks assessed                                 General Comments: A & Ox4. Pt agreeable to session      Exercises      General Comments        Pertinent Vitals/Pain Pain Assessment: 0-10 Pain Score: 5  Faces Pain Scale: Hurts little more Pain Location: LLE with movements Pain Descriptors / Indicators:  Aching;Discomfort;Grimacing Pain Intervention(s): Limited activity within patient's tolerance;Repositioned;Monitored during session    Home Living                      Prior Function            PT Goals (current goals can now be found in the care plan section) Acute Rehab PT Goals Patient Stated Goal: to move better Progress towards PT goals: Progressing toward goals    Frequency    Min 2X/week      PT Plan Current plan remains appropriate    Co-evaluation              AM-PAC PT "6 Clicks" Mobility   Outcome Measure  Help needed turning from your back to your side while in a flat bed without using bedrails?: A Little Help needed moving from lying on your back to sitting on the side of a flat bed without using bedrails?: A Little Help needed moving to and from a bed to a chair (including a wheelchair)?: A Little Help needed standing up from a chair using your arms (e.g., wheelchair or bedside chair)?: A Lot Help needed to walk in hospital room?: A Lot Help needed climbing 3-5 steps with a railing? : A Lot 6 Click Score: 15    End of Session Equipment Utilized During Treatment: Gait belt Activity Tolerance: Patient tolerated treatment well Patient left: in bed;with bed alarm set Nurse Communication: Mobility status PT Visit Diagnosis: Difficulty in walking, not elsewhere classified (R26.2);Unsteadiness on feet (R26.81);Muscle weakness (generalized) (M62.81)     Time: 0034-9179 PT Time Calculation (min) (ACUTE ONLY): 42 min  Charges:  $Gait Training: 23-37 mins $Therapeutic Activity: 8-22 mins                     Jetta Lout PTA 12/08/19, 2:40 PM

## 2019-12-08 NOTE — Consult Note (Signed)
[facility] / FOOT AND ANKLE SURGERY CONSULTATION NOTE  Requesting Physician: Dr. Myriam Forehand  Reason for consult: LLE cellulitis, swelling, possible abscess  Chief Complaint: L leg and foot pain/swelling   HPI: Flavius Junior Boissonneault is a 54 y.o. male who presents with painful and swollen legs left greater than right.  Patient notes that he has pain to the dorsal aspect of the left foot as well as left ankle left knee and left hip.  Patient states that this has been going on for a few weeks now.  He notes he is always had lower extremity edema but it has gotten progressively worse.  Patient was admitted to the hospital due to concerns for cellulitic changes to the left lower extremity.  Patient had a blood culture that came back positive for Streptococcus group G.  Patient has been receiving IV antibiotics.  Patient's white blood cell count has now normalized.  Podiatry team was consulted along with orthopedics for evaluation of the left lower extremity swelling.  A CT scan was taken of the left lower extremity which showed an area of small fluid collection about the medial ankle in which patient has a wound to that area but appears to be fairly stable.  Patient denies nausea, vomiting, fever, chills.  Patient does note that his leg does feel better since his admission and receiving IV antibiotics as well as compressive dressings.  PMHx:  Past Medical History:  Diagnosis Date  . Lower extremity edema   . Morbid obesity (HCC) 11/27/2019  . Tobacco abuse 11/27/2019    Surgical Hx:  Past Surgical History:  Procedure Laterality Date  . DEBRIDEMENT OF ABDOMINAL WALL ABSCESS    . TEE WITHOUT CARDIOVERSION N/A 12/07/2019   Procedure: TRANSESOPHAGEAL ECHOCARDIOGRAM (TEE);  Surgeon: Antonieta Iba, MD;  Location: [facility] ORS;  Service: Cardiovascular;  Laterality: N/A;    FHx:  Family History  Problem Relation Age of Onset  . Heart disease Mother        died in her 83's  . Heart disease Father         died in his 75's    Social History:  reports that he has been smoking cigarettes. He has been smoking about 0.10 packs per day. He has never used smokeless tobacco. He reports current alcohol use. He reports current drug use. Drug: Marijuana.  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 acetaminophen (TYLENOL) 325 MG tablet Take by mouth.    [facility] lisinopril (ZESTRIL) 20 MG tablet Take by mouth. (Patient not taking: Reported on 11/29/2019)      Physical Exam: General: Alert and oriented.  No apparent distress.  Vascular: DP/PT pulses palpable bilateral.  No hair growth noted to digits.  Capillary fill time appears to be intact to digits bilateral.  Moderate amount of nonpitting edema present to bilateral lower extremities with lichenification of skin.  Neuro: Light touch sensation reduced to digits bilateral.  Derm: Venous type ulceration to the left medial ankle, dorsal left foot, proximal lateral left calf, right circumferential lower leg  with multiple small dermal type ulceration/blisters.  Wounds do not appear to have any erythema, no drainage is present.  No overt obvious areas of fluctuance palpable to the left lower extremity.  Patient's dressing today was only to the mid calf area and it appears as though multiple fluid collections had developed above this area due to the compression wrap not being high enough for the patient.  Gross  lichenification of skin noted circumferentially to bilateral lower extremities from the dorsal foot to proximal tibia bilateral.  MSK: Pain on palpation to the left dorsal foot, ankle, left knee, left hip.  Results for orders placed or performed during the hospital encounter of 11/24/19 (from the past 48 hour(s))  Heparin level (unfractionated)     Status: None   Collection Time: 12/06/19 10:30 PM  Result Value Ref Range   Heparin Unfractionated 0.59 0.30 - 0.70 IU/mL    Comment: (NOTE) If heparin results are below expected values, and patient dosage has  been confirmed, suggest follow up testing of antithrombin III levels. Performed at [facility], [address] [place], [place] [address]   CBC     Status: Abnormal   Collection Time: 12/07/19  6:14 AM  Result Value Ref Range   WBC 7.4 4.0 - 10.5 K/uL   RBC 4.35 4.22 - 5.81 MIL/uL   Hemoglobin 9.4 (L) 13.0 - 17.0 g/dL   HCT 19.1 (L) 39 - 52 %   MCV 70.6 (L) 80.0 - 100.0 fL   MCH 21.6 (L) 26.0 - 34.0 pg   MCHC 30.6 30.0 - 36.0 g/dL   RDW 47.8 (H) 29.5 - 62.1 %   Platelets 312 150 - 400 K/uL   nRBC 0.3 (H) 0.0 - 0.2 %    Comment: Performed at [facility], [address] [place], [place] [address]  Heparin level (unfractionated)     Status: Abnormal   Collection Time: 12/07/19  6:14 AM  Result Value Ref Range   Heparin Unfractionated 0.11 (L) 0.30 - 0.70 IU/mL    Comment: (NOTE) If heparin results are below expected values, and patient dosage has  been confirmed, suggest follow up testing of antithrombin III levels. Performed at [facility], [address] [place], [place] [address]   Basic metabolic panel     Status: Abnormal   Collection Time: 12/07/19  6:14 AM  Result Value Ref Range   Sodium 134 (L) 135 - 145 mmol/L   Potassium 4.4 3.5 - 5.1 mmol/L   Chloride 96 (L) 98 - 111 mmol/L   CO2 28 22 - 32 mmol/L   Glucose, Bld 104 (H) 70 - 99 mg/dL    Comment: Glucose reference range  applies only to samples taken after fasting for at least 8 hours.   BUN 17 6 - 20 mg/dL   Creatinine, Ser 6.29 0.61 - 1.24 mg/dL   Calcium 8.7 (L) 8.9 - 10.3 mg/dL   GFR calc non Af Amer >60 >60 mL/min   GFR calc Af Amer >60 >60 mL/min   Anion gap 10 5 - 15    Comment: Performed at [facility], [address] [place], [place] [address]  Heparin level (unfractionated)     Status: None   Collection Time: 12/07/19 12:21 PM  Result Value Ref Range   Heparin Unfractionated 0.61 0.30 - 0.70 IU/mL    Comment: (NOTE) If heparin results are below expected values, and patient dosage has  been confirmed, suggest follow up testing of antithrombin  III levels. Performed at [facility], [address] [place], [place] [address]   Heparin level (unfractionated)     Status: Abnormal   Collection Time: 12/07/19  5:44 PM  Result Value Ref Range   Heparin Unfractionated 0.72 (H) 0.30 - 0.70 IU/mL    Comment: (NOTE) If heparin results are below expected values, and patient dosage has  been confirmed, suggest follow up testing of antithrombin III levels. Performed at [facility], [address] [place], [place] [address]   Basic metabolic panel     Status: Abnormal   Collection Time: 12/08/19 12:35 AM  Result Value Ref Range   Sodium 134 (L) 135 - 145 mmol/L   Potassium 4.4 3.5 - 5.1 mmol/L   Chloride 98 98 - 111 mmol/L   CO2 26 22 - 32 mmol/L   Glucose, Bld 105 (H) 70 - 99 mg/dL    Comment: Glucose reference range applies only to samples taken after fasting for at least 8 hours.   BUN 18 6 - 20 mg/dL   Creatinine, Ser 6.28 0.61 - 1.24 mg/dL   Calcium 8.9 8.9 - 36.6 mg/dL   GFR calc non Af Amer >60 >60 mL/min   GFR calc Af Amer >60 >60 mL/min   Anion gap 10 5 - 15    Comment: Performed at [facility], [address] [place], [place] [address]  Magnesium     Status: None   Collection Time: 12/08/19 12:35 AM  Result Value Ref Range   Magnesium  2.3 1.7 - 2.4 mg/dL    Comment: Performed at [facility], [address] [place], [place] [address]  Heparin level (unfractionated)     Status: Abnormal   Collection Time: 12/08/19 12:35 AM  Result Value Ref Range   Heparin Unfractionated 0.83 (H) 0.30 - 0.70 IU/mL    Comment: (NOTE) If heparin results are below expected values, and patient dosage has  been confirmed, suggest follow up testing of antithrombin III levels. Performed at [facility], [address] [place], [place] [address]   Heparin level (unfractionated)     Status: Abnormal   Collection Time: 12/08/19  8:32 AM  Result Value Ref Range   Heparin Unfractionated 0.84 (H) 0.30 - 0.70 IU/mL    Comment: (NOTE) If heparin results are below expected values, and patient dosage has  been confirmed, suggest follow up testing of antithrombin III levels. Performed at [facility], [address] [place], [place] [address]    CT TIBIA FIBULA LEFT W CONTRAST  Result Date: 12/07/2019 CLINICAL DATA:  Bilateral lower extremity edema, question abscess EXAM: CT OF THE LOWER RIGHT EXTREMITY WITH CONTRAST TECHNIQUE: Multidetector CT imaging of the lower right extremity was performed according to the standard protocol following intravenous contrast administration. COMPARISON:  None. CONTRAST:  OMNIPAQUE IOHEXOL 300 MG/ML  SOLN FINDINGS: Bones/Joint/Cartilage No fracture or dislocation. Mild femoroacetabular and medial compartment osteoarthritis with joint space loss and marginal osteophyte formation. There is enthesophytes seen at the medial femoral condyle, likely from prior ligamentous injury. Ligaments Suboptimally assessed by CT. Muscles and Tendons The muscles surrounding the lower extremity intact without focal atrophy or tear. The tendons surrounding the lower extremity appear to be intact. Soft tissues There is diffuse skin thickening and subcutaneous edema seen surrounding the extremity. Loculated  seen the anterolateral aspect of the distal femur and knee. There is also diffuse subcutaneous edema seen around the ankle with areas of induration the medial portion the  ankle. Along the medial portion the ankle there is a focal area possible ulceration and a loculated collection measuring 1.7 x 0.7 x 5.2 cm and extends along the anterior distal tibia. There is small soft tissue calcifications seen along the anterior tibia. There is also ossification along the posterior proximal fibula likely from prior injury. IMPRESSION: 1. Findings suggestive of diffuse cellulitis involving the lower extremity with subcutaneous edema/phlegmon surrounding the distal femur and knee. 2. There is also induration and a probable area of ulceration seen along the medial aspect of the ankle with a small loculated fluid collection measuring 1.7 x 0.7 x 5.2 cm as described above. 3. Scattered areas of soft tissue calcifications. Electronically Signed   By: Jonna Clark M.D.   On: 12/07/2019 20:29   CT FEMUR LEFT W CONTRAST  Result Date: 12/07/2019 CLINICAL DATA:  Bilateral lower extremity edema, question abscess EXAM: CT OF THE LOWER RIGHT EXTREMITY WITH CONTRAST TECHNIQUE: Multidetector CT imaging of the lower right extremity was performed according to the standard protocol following intravenous contrast administration. COMPARISON:  None. CONTRAST:  OMNIPAQUE IOHEXOL 300 MG/ML  SOLN FINDINGS: Bones/Joint/Cartilage No fracture or dislocation. Mild femoroacetabular and medial compartment osteoarthritis with joint space loss and marginal osteophyte formation. There is enthesophytes seen at the medial femoral condyle, likely from prior ligamentous injury. Ligaments Suboptimally assessed by CT. Muscles and Tendons The muscles surrounding the lower extremity intact without focal atrophy or tear. The tendons surrounding the lower extremity appear to be intact. Soft tissues There is diffuse skin thickening and subcutaneous edema seen  surrounding the extremity. Loculated seen the anterolateral aspect of the distal femur and knee. There is also diffuse subcutaneous edema seen around the ankle with areas of induration the medial portion the ankle. Along the medial portion the ankle there is a focal area possible ulceration and a loculated collection measuring 1.7 x 0.7 x 5.2 cm and extends along the anterior distal tibia. There is small soft tissue calcifications seen along the anterior tibia. There is also ossification along the posterior proximal fibula likely from prior injury. IMPRESSION: 1. Findings suggestive of diffuse cellulitis involving the lower extremity with subcutaneous edema/phlegmon surrounding the distal femur and knee. 2. There is also induration and a probable area of ulceration seen along the medial aspect of the ankle with a small loculated fluid collection measuring 1.7 x 0.7 x 5.2 cm as described above. 3. Scattered areas of soft tissue calcifications. Electronically Signed   By: Jonna Clark M.D.   On: 12/07/2019 20:29   CT FOOT LEFT W CONTRAST  Result Date: 12/07/2019 CLINICAL DATA:  Bilateral lower extremity edema, question abscess EXAM: CT OF THE LOWER RIGHT EXTREMITY WITH CONTRAST TECHNIQUE: Multidetector CT imaging of the lower right extremity was performed according to the standard protocol following intravenous contrast administration. COMPARISON:  None. CONTRAST:  OMNIPAQUE IOHEXOL 300 MG/ML  SOLN FINDINGS: Bones/Joint/Cartilage No fracture or dislocation. Mild femoroacetabular and medial compartment osteoarthritis with joint space loss and marginal osteophyte formation. There is enthesophytes seen at the medial femoral condyle, likely from prior ligamentous injury. Ligaments Suboptimally assessed by CT. Muscles and Tendons The muscles surrounding the lower extremity intact without focal atrophy or tear. The tendons surrounding the lower extremity appear to be intact. Soft tissues There is diffuse skin  thickening and subcutaneous edema seen surrounding the extremity. Loculated seen the anterolateral aspect of the distal femur and knee. There is also diffuse subcutaneous edema seen around the ankle with areas of induration the medial  portion the ankle. Along the medial portion the ankle there is a focal area possible ulceration and a loculated collection measuring 1.7 x 0.7 x 5.2 cm and extends along the anterior distal tibia. There is small soft tissue calcifications seen along the anterior tibia. There is also ossification along the posterior proximal fibula likely from prior injury. IMPRESSION: 1. Findings suggestive of diffuse cellulitis involving the lower extremity with subcutaneous edema/phlegmon surrounding the distal femur and knee. 2. There is also induration and a probable area of ulceration seen along the medial aspect of the ankle with a small loculated fluid collection measuring 1.7 x 0.7 x 5.2 cm as described above. 3. Scattered areas of soft tissue calcifications. Electronically Signed   By: Jonna ClarkBindu  Avutu M.D.   On: 12/07/2019 20:29   ECHO TEE  Result Date: 12/07/2019    TRANSESOPHOGEAL ECHO REPORT   Patient Name:   Doristine JohnsHOMAS JUNIOR Nicklas Date of Exam: 12/07/2019 Medical Rec #:  [id number]            Height:       79.0 in Accession #:    [id number]           Weight:       396.8 lb Date of Birth:  [birth year]            BSA:          3.049 m Patient Age:    54 years             BP:           114/60 mmHg Patient Gender: M                    HR:           62 bpm. Exam Location:  [facility] Procedure: Transesophageal Echo, Color Doppler, Cardiac Doppler and Saline            Contrast Bubble Study Indications:     R78.81 Bacteremia  History:         Patient has prior history of Echocardiogram examinations, most                  recent 11/27/2019.  Sonographer:     Humphrey RollsJoan Heiss RDCS (AE) Referring Phys:  [id number] Sondra BargesYAN M DUNN Diagnosing Phys: Julien Nordmannimothy Gollan MD PROCEDURE: After discussion of the risks and benefits of a  TEE, an informed consent was obtained from the patient. TEE procedure time was 40 minutes. The transesophogeal probe was passed without difficulty through the esophogus of the patient. Imaged were obtained with the patient in a left lateral decubitus position. Local oropharyngeal anesthetic was provided with Cetacaine and Benzocaine spray. Sedation performed by performing physician. Image quality was excellent. The patient's vital signs; including heart rate, blood pressure, and oxygen saturation; remained stable throughout the procedure. The patient developed no complications during the procedure. IMPRESSIONS  1. Left ventricular ejection fraction, by estimation, is 55 . The left ventricle has normal function. The left ventricle has no regional wall motion abnormalities.  2. Right ventricular systolic function is normal. The right ventricular size is normal.  3. Left atrial size was mildly dilated. No left atrial/left atrial appendage thrombus was detected.  4. No valve vegetation noted.  5. Picc line catheter noted in the right atrium. Conclusion(s)/Recommendation(s): Normal biventricular function without evidence of hemodynamically significant valvular heart disease. FINDINGS  Left Ventricle: Left ventricular ejection fraction, by estimation, is 55 to 60%. The left ventricle has normal function. The  left ventricle has no regional wall motion abnormalities. The left ventricular internal cavity size was normal in size. There is  no left ventricular hypertrophy. Right Ventricle: The right ventricular size is normal. No increase in right ventricular wall thickness. Right ventricular systolic function is normal. Left Atrium: Left atrial size was mildly dilated. No left atrial/left atrial appendage thrombus was detected. Right Atrium: Right atrial size was normal in size. Pericardium: There is no evidence of pericardial effusion. Mitral Valve: The mitral valve is normal in structure. Normal mobility of the mitral valve  leaflets. Trivial mitral valve regurgitation. No evidence of mitral valve stenosis. Tricuspid Valve: The tricuspid valve is normal in structure. Tricuspid valve regurgitation is not demonstrated. No evidence of tricuspid stenosis. Aortic Valve: The aortic valve is normal in structure. Aortic valve regurgitation is not visualized. No aortic stenosis is present. Pulmonic Valve: The pulmonic valve was normal in structure. Pulmonic valve regurgitation is not visualized. No evidence of pulmonic stenosis. Aorta: The aortic root is normal in size and structure. Venous: The inferior vena cava is normal in size with greater than 50% respiratory variability, suggesting right atrial pressure of 3 mmHg. IAS/Shunts: No atrial level shunt detected by color flow Doppler. Agitated saline contrast was given intravenously to evaluate for intracardiac shunting. No ventricular septal defect is seen or detected. There is no evidence of an atrial septal defect. Julien Nordmann MD Electronically signed by Julien Nordmann MD Signature Date/Time: 12/07/2019/1:52:09 PM    Final     Blood pressure (!) 109/58, pulse 62, temperature 97.8 F (36.6 C), temperature source Oral, resp. rate 18, height 6\' 7"  (2.007 m), weight (!) 171.7 kg, SpO2 95 %.  Assessment 1. Bacteremia secondary to potential left lower extremity wounds/abscess 2. Chronic venous insufficiency 3. Lymphedema bilateral lower extremities 4. CHF  Plan -Patient seen and examined along with Dr. , orthopedics -Patient appears to have severe lichenification of skin to bilateral lower extremities left greater than right as well as moderate lymphedema type changes as well as venous stasis to the left lower extremity greater than right.  Patient has multiple wounds to bilateral lower extremities, no purulence is able be expressed from any of these wounds and there appears to be no erythema around any of these wounds either. -CT reviewed.  Small collection of fluid about the  medial ankle in the area of the open ulceration but the ulceration does not appear to be draining any fluid at this time.  No evidence of osteomyelitis.  No fluctuance palpable on today's examination around this area. -Recommend with Dr. Allena Katz of performing left knee, ankle, foot MRI with contrast if possible. -Order placed for wound care consultation for Unna boot placement to bilateral lower extremities. -Redressed lower extremity leg wounds today with Xeroform followed by 4 x 4 gauze, Kerlix, Ace wrap and Coban.  Recommend dressing changes every 2 to 3 days. -Defer to infectious disease for further antibiotic management. -Not likely to perform surgical intervention for this but will review MRI once performed to determine if any surgical intervention is needed.  CT findings do not correlate with clinical examination is present today, no discrete discernible abscess present -Once MRI is obtained will review and discussed with patient in detail.  Allena Katz, DPM 12/08/2019, 3:53 PM

## 2019-12-08 NOTE — Progress Notes (Signed)
ORTHOPAEDIC CONSULTATION  REQUESTING PHYSICIAN: Lurene Shadow, MD  Chief Complaint:   Left leg pain  History of Present Illness: Alfred [name] is a 54 y.o. male with primary complaint of left lower extremity pain and swelling.  He has been admitted to the hospital for cellulitis and CHF exacerbation.  This is been treated for both of these issues the past 2 weeks while he has been in inpatient.  He has had continued difficulty lifting his left leg and is pain along the thigh and leg.  A CT scan was obtained.  This showed possible fluid collection about the lateral aspect of the knee/distal femur.  Of note there is also a possible fluid collection about the medial ankle, for which the podiatry team was consulted.  The patient states his pain is primary located about the lateral aspect of the thigh.  He has less pain about the knee region.  He does note significant swelling about the left lower extremity, although this is significantly improved over the past week.   Past Medical History:  Diagnosis Date  . Lower extremity edema   . Morbid obesity (HCC) 11/27/2019  . Tobacco abuse 11/27/2019   Past Surgical History:  Procedure Laterality Date  . DEBRIDEMENT OF ABDOMINAL WALL ABSCESS    . TEE WITHOUT CARDIOVERSION N/A 12/07/2019   Procedure: TRANSESOPHAGEAL ECHOCARDIOGRAM (TEE);  Surgeon: Antonieta Iba, MD;  Location: [facility] ORS;  Service: Cardiovascular;  Laterality: N/A;   Social History   Socioeconomic History  . Marital status: Single    Spouse name: Not on file  . Number of children: Not on file  . Years of education: Not on file  . Highest education level: Not on file  Occupational History  . Not on file  Tobacco Use  . Smoking status: Current Every Day Smoker    Packs/day: 0.10    Types: Cigarettes  . Smokeless tobacco: Never Used  . Tobacco comment: smokes 1/2 pack per week.  Substance and Sexual  Activity  . Alcohol use: Yes    Comment: 1 beer/month  . Drug use: Yes    Types: Marijuana    Comment: denies this 11/27/2019  . Sexual activity: Not on file  Other Topics Concern  . Not on file  Social History Narrative   Lives in [place] w/ family.  Currently unemployed.  Does not routinely exercise.   Social Determinants of Health   Financial Resource Strain:   . Difficulty of Paying Living Expenses: Not on file  Food Insecurity:   . Worried About Programme researcher, broadcasting/film/video in the Last Year: Not on file  . Ran Out of Food in the Last Year: Not on file  Transportation Needs:   . Lack of Transportation (Medical): Not on file  . Lack of Transportation (Non-Medical): Not on file  Physical Activity:   . Days of Exercise per Week: Not on file  . Minutes of Exercise per Session: Not on file  Stress:   . Feeling of Stress : Not on file  Social Connections:   . Frequency of Communication with Friends and Family: Not on file  . Frequency of Social Gatherings with Friends and Family: Not on file  . Attends Religious Services: Not on file  . Active Member of Clubs or Organizations: Not on file  . Attends Banker Meetings: Not on file  . Marital Status: Not on file   Family History  Problem Relation Age of Onset  . Heart disease Mother  died in her 58's  . Heart disease Father        died in his 24's   No Known Allergies Prior to Admission medications   Medication Sig Start Date End Date Taking? Authorizing Provider  acetaminophen (TYLENOL) 325 MG tablet Take by mouth. 01/25/18  Yes [provider]  lisinopril (ZESTRIL) 20 MG tablet Take by mouth. Patient not taking: Reported on 11/29/2019 04/07/18   [provider]   Recent Labs    12/06/19 0500 12/06/19 0500 12/07/19 0614 12/08/19 0035  WBC 8.8  --  7.4  --   HGB 9.1*  --  9.4*  --   HCT 29.2*  --  30.7*  --   PLT 278  --  312  --   K 4.2   < > 4.4 4.4  CL 95*   < > 96* 98  CO2 28   <  > 28 26  BUN 18   < > 17 18  CREATININE 1.01   < > 1.06 1.08  GLUCOSE 115*   < > 104* 105*  CALCIUM 8.6*   < > 8.7* 8.9   < > = values in this interval not displayed.   CT TIBIA FIBULA LEFT W CONTRAST  Result Date: 12/07/2019 CLINICAL DATA:  Bilateral lower extremity edema, question abscess EXAM: CT OF THE LOWER RIGHT EXTREMITY WITH CONTRAST TECHNIQUE: Multidetector CT imaging of the lower right extremity was performed according to the standard protocol following intravenous contrast administration. COMPARISON:  None. CONTRAST:  OMNIPAQUE IOHEXOL 300 MG/ML  SOLN FINDINGS: Bones/Joint/Cartilage No fracture or dislocation. Mild femoroacetabular and medial compartment osteoarthritis with joint space loss and marginal osteophyte formation. There is enthesophytes seen at the medial femoral condyle, likely from prior ligamentous injury. Ligaments Suboptimally assessed by CT. Muscles and Tendons The muscles surrounding the lower extremity intact without focal atrophy or tear. The tendons surrounding the lower extremity appear to be intact. Soft tissues There is diffuse skin thickening and subcutaneous edema seen surrounding the extremity. Loculated seen the anterolateral aspect of the distal femur and knee. There is also diffuse subcutaneous edema seen around the ankle with areas of induration the medial portion the ankle. Along the medial portion the ankle there is a focal area possible ulceration and a loculated collection measuring 1.7 x 0.7 x 5.2 cm and extends along the anterior distal tibia. There is small soft tissue calcifications seen along the anterior tibia. There is also ossification along the posterior proximal fibula likely from prior injury. IMPRESSION: 1. Findings suggestive of diffuse cellulitis involving the lower extremity with subcutaneous edema/phlegmon surrounding the distal femur and knee. 2. There is also induration and a probable area of ulceration seen along the medial aspect of the  ankle with a small loculated fluid collection measuring 1.7 x 0.7 x 5.2 cm as described above. 3. Scattered areas of soft tissue calcifications. Electronically Signed   By: Jonna Clark M.D.   On: 12/07/2019 20:29   CT FEMUR LEFT W CONTRAST  Result Date: 12/07/2019 CLINICAL DATA:  Bilateral lower extremity edema, question abscess EXAM: CT OF THE LOWER RIGHT EXTREMITY WITH CONTRAST TECHNIQUE: Multidetector CT imaging of the lower right extremity was performed according to the standard protocol following intravenous contrast administration. COMPARISON:  None. CONTRAST:  OMNIPAQUE IOHEXOL 300 MG/ML  SOLN FINDINGS: Bones/Joint/Cartilage No fracture or dislocation. Mild femoroacetabular and medial compartment osteoarthritis with joint space loss and marginal osteophyte formation. There is enthesophytes seen at the medial femoral condyle, likely from  prior ligamentous injury. Ligaments Suboptimally assessed by CT. Muscles and Tendons The muscles surrounding the lower extremity intact without focal atrophy or tear. The tendons surrounding the lower extremity appear to be intact. Soft tissues There is diffuse skin thickening and subcutaneous edema seen surrounding the extremity. Loculated seen the anterolateral aspect of the distal femur and knee. There is also diffuse subcutaneous edema seen around the ankle with areas of induration the medial portion the ankle. Along the medial portion the ankle there is a focal area possible ulceration and a loculated collection measuring 1.7 x 0.7 x 5.2 cm and extends along the anterior distal tibia. There is small soft tissue calcifications seen along the anterior tibia. There is also ossification along the posterior proximal fibula likely from prior injury. IMPRESSION: 1. Findings suggestive of diffuse cellulitis involving the lower extremity with subcutaneous edema/phlegmon surrounding the distal femur and knee. 2. There is also induration and a probable area of ulceration  seen along the medial aspect of the ankle with a small loculated fluid collection measuring 1.7 x 0.7 x 5.2 cm as described above. 3. Scattered areas of soft tissue calcifications. Electronically Signed   By: Jonna Clark M.D.   On: 12/07/2019 20:29   CT FOOT LEFT W CONTRAST  Result Date: 12/07/2019 CLINICAL DATA:  Bilateral lower extremity edema, question abscess EXAM: CT OF THE LOWER RIGHT EXTREMITY WITH CONTRAST TECHNIQUE: Multidetector CT imaging of the lower right extremity was performed according to the standard protocol following intravenous contrast administration. COMPARISON:  None. CONTRAST:  OMNIPAQUE IOHEXOL 300 MG/ML  SOLN FINDINGS: Bones/Joint/Cartilage No fracture or dislocation. Mild femoroacetabular and medial compartment osteoarthritis with joint space loss and marginal osteophyte formation. There is enthesophytes seen at the medial femoral condyle, likely from prior ligamentous injury. Ligaments Suboptimally assessed by CT. Muscles and Tendons The muscles surrounding the lower extremity intact without focal atrophy or tear. The tendons surrounding the lower extremity appear to be intact. Soft tissues There is diffuse skin thickening and subcutaneous edema seen surrounding the extremity. Loculated seen the anterolateral aspect of the distal femur and knee. There is also diffuse subcutaneous edema seen around the ankle with areas of induration the medial portion the ankle. Along the medial portion the ankle there is a focal area possible ulceration and a loculated collection measuring 1.7 x 0.7 x 5.2 cm and extends along the anterior distal tibia. There is small soft tissue calcifications seen along the anterior tibia. There is also ossification along the posterior proximal fibula likely from prior injury. IMPRESSION: 1. Findings suggestive of diffuse cellulitis involving the lower extremity with subcutaneous edema/phlegmon surrounding the distal femur and knee. 2. There is also induration  and a probable area of ulceration seen along the medial aspect of the ankle with a small loculated fluid collection measuring 1.7 x 0.7 x 5.2 cm as described above. 3. Scattered areas of soft tissue calcifications. Electronically Signed   By: Jonna Clark M.D.   On: 12/07/2019 20:29   ECHO TEE  Result Date: 12/07/2019    TRANSESOPHOGEAL ECHO REPORT   Patient Name:   RAPHEL STICKLES Date of Exam: 12/07/2019 Medical Rec #:  [id number]            Height:       79.0 in Accession #:    [id number]           Weight:       396.8 lb Date of Birth:  Mar 03, 1966  BSA:          3.049 m Patient Age:    54 years             BP:           114/60 mmHg Patient Gender: M                    HR:           62 bpm. Exam Location:  [facility] Procedure: Transesophageal Echo, Color Doppler, Cardiac Doppler and Saline            Contrast Bubble Study Indications:     R78.81 Bacteremia  History:         Patient has prior history of Echocardiogram examinations, most                  recent 11/27/2019.  Sonographer:     Humphrey RollsJoan Heiss RDCS (AE) Referring Phys:  [id number] Sondra BargesYAN M DUNN Diagnosing Phys: Julien Nordmannimothy Gollan MD PROCEDURE: After discussion of the risks and benefits of a TEE, an informed consent was obtained from the patient. TEE procedure time was 40 minutes. The transesophogeal probe was passed without difficulty through the esophogus of the patient. Imaged were obtained with the patient in a left lateral decubitus position. Local oropharyngeal anesthetic was provided with Cetacaine and Benzocaine spray. Sedation performed by performing physician. Image quality was excellent. The patient's vital signs; including heart rate, blood pressure, and oxygen saturation; remained stable throughout the procedure. The patient developed no complications during the procedure. IMPRESSIONS  1. Left ventricular ejection fraction, by estimation, is 55 . The left ventricle has normal function. The left ventricle has no regional wall motion abnormalities.   2. Right ventricular systolic function is normal. The right ventricular size is normal.  3. Left atrial size was mildly dilated. No left atrial/left atrial appendage thrombus was detected.  4. No valve vegetation noted.  5. Picc line catheter noted in the right atrium. Conclusion(s)/Recommendation(s): Normal biventricular function without evidence of hemodynamically significant valvular heart disease. FINDINGS  Left Ventricle: Left ventricular ejection fraction, by estimation, is 55 to 60%. The left ventricle has normal function. The left ventricle has no regional wall motion abnormalities. The left ventricular internal cavity size was normal in size. There is  no left ventricular hypertrophy. Right Ventricle: The right ventricular size is normal. No increase in right ventricular wall thickness. Right ventricular systolic function is normal. Left Atrium: Left atrial size was mildly dilated. No left atrial/left atrial appendage thrombus was detected. Right Atrium: Right atrial size was normal in size. Pericardium: There is no evidence of pericardial effusion. Mitral Valve: The mitral valve is normal in structure. Normal mobility of the mitral valve leaflets. Trivial mitral valve regurgitation. No evidence of mitral valve stenosis. Tricuspid Valve: The tricuspid valve is normal in structure. Tricuspid valve regurgitation is not demonstrated. No evidence of tricuspid stenosis. Aortic Valve: The aortic valve is normal in structure. Aortic valve regurgitation is not visualized. No aortic stenosis is present. Pulmonic Valve: The pulmonic valve was normal in structure. Pulmonic valve regurgitation is not visualized. No evidence of pulmonic stenosis. Aorta: The aortic root is normal in size and structure. Venous: The inferior vena cava is normal in size with greater than 50% respiratory variability, suggesting right atrial pressure of 3 mmHg. IAS/Shunts: No atrial level shunt detected by color flow Doppler. Agitated saline  contrast was given intravenously to evaluate for intracardiac shunting. No ventricular septal defect is seen or detected.  There is no evidence of an atrial septal defect. Julien Nordmann MD Electronically signed by Julien Nordmann MD Signature Date/Time: 12/07/2019/1:52:09 PM    Final      Positive ROS: All other systems have been reviewed and were otherwise negative with the exception of those mentioned in the HPI and as above.  Physical Exam: BP (!) 109/58 (BP Location: Right Arm)   Pulse 62   Temp 97.8 F (36.6 C) (Oral)   Resp 18   Ht 6\' 7"  (2.007 m)   Wt (!) 171.7 kg   SpO2 95%   BMI 42.64 kg/m  General:  Alert, no acute distress Psychiatric:  Patient is competent for consent with normal mood and affect   Cardiovascular:  No pedal edema, regular rate and rhythm Respiratory:  No wheezing, non-labored breathing GI:  Abdomen is soft and non-tender Skin:  No lesions in the area of chief complaint, no erythema Neurologic:  Sensation intact distally, CN grossly intact Lymphatic:  No axillary or cervical lymphadenopathy  Orthopedic Exam:  LLE: 5/5 DF/PF/EHL SILT s/s/sp/dp distr, mild numbness about the tibial distribution along the sole of the foot Foot wwp, positive DP pulse RoM knee: 0-110 without significant pain. Significant brawny edema and chronic venous stasis changes about leg.  Mild focal swelling about the lateral aspect of the knee tracking slightly proximally.  This region is not particularly tender or erythematous.  There is no gross fluid deep to this region.  The patient is more significantly tender along the lateral aspect of the thigh more proximal to this region along the entire course of the IT band. Patient able to perform straight leg raise, but with significant difficulty   Imaging: As above; possible fluid collection about the lateral aspect of the distal thigh/knee with significant soft tissue swelling.  Assessment/Plan: 54 year old male with history of CHF  and cellulitis that has been treated for the past 2 weeks while he has been an inpatient.  He has persistent left lower extremity pain.  CT scan shows possible fluid collection about the anterolateral aspect of the distal thigh/knee.  1.  I recommend obtaining an MRI of the left knee to further evaluate this possible fluid collection.  His clinical exam does not necessarily correlate with the finding on the CT scan as his pain is much more proximal along the lateral thigh.  2.  Defer to the podiatry team regarding left ankle findings.  3.  I will plan to follow-up after the MRI is obtained in order to recommend a more definitive plan.    40   12/08/2019 4:03 PM

## 2019-12-08 NOTE — Progress Notes (Addendum)
Progress Note    Alfred [name]  XWR:[id number] DOB: 07-16-1965  DOA: 11/24/2019 PCP: Patient, No Pcp Per      Brief Narrative:    Medical records reviewed and are as summarized below:  Alfred [name] is a 54 y.o. male       Assessment/Plan:   Principal Problem:   Streptococcal sepsis, unspecified (HCC) Active Problems:   Cellulitis of left leg   AKI (acute kidney injury) (HCC)   Bacteremia   Lower extremity edema   Morbid obesity (HCC)   Tobacco abuse   Paroxysmal atrial fibrillation (HCC)   Acute on chronic heart failure with preserved ejection fraction (HFpEF) (HCC)   Atrial flutter (HCC)   Severe sepsis and group B strep bacteremia, left lower extremity cellulitis, left leg wound (Wound culture from left leg on 11/27/2019 showed Pseudomonas aeruginosa)  Atrial flutter with RVR (CHaDs2VASC score of 1)-converted to NSR  Significantly elevated D-dimer (1,759)-CT venogram, venous duplex and CTA did not show any evidence of venous thromboembolism.  Acute on chronic diastolic CHF  Hypokalemia-improved  Chronic venous insufficiency/venous stasis  Hepatitis C-outpatient follow-up for further management  Tobacco use disorder  AKI-resolved  Body mass index is 42.64 kg/m.  (Morbid obesity): This complicates overall care and prognosis   PLAN  S/p TEE on 12/07/2019.  There was no evidence of vegetations. IV heparin has been discontinued and he has been started on Eliquis Continue IV Lasix and monitor BMP/electrolytes. CT left lower extremity showed diffuse cellulitis with phlegmon surrounding distal femoral and small loculated fluid collection around the left ankle.  Loculated fluid collection.  Consulted orthopedic surgeon, Dr. Allena KatzPatel and podiatrist, Dr. Excell SeltzerBaker for further evaluation.  Dr. Allena KatzPatel recommended MRI of the left knee. Continue IV Unasyn and IV Cipro. Continue oral amiodarone for atrial flutter.   Diet Order            Diet heart  healthy/carb modified Room service appropriate? Yes; Fluid consistency: Thin  Diet effective now                    Consultants:  Cardiologist  Infectious disease  Procedures:  TEE on 12/07/2019    Medications:   . amiodarone  200 mg Oral BID  . [START ON 12/13/2019] amiodarone  200 mg Oral Daily  . apixaban  5 mg Oral BID  . Chlorhexidine Gluconate Cloth  6 each Topical Daily  . diclofenac Sodium  2 g Topical QID  . furosemide  60 mg Intravenous BID  . metoprolol tartrate  50 mg Oral BID  . polyethylene glycol  17 g Oral Daily  . potassium chloride  20 mEq Oral Daily  . senna  2 tablet Oral QHS  . sodium chloride flush  10-40 mL Intracatheter Q12H   Continuous Infusions: . sodium chloride 250 mL (12/07/19 1212)  . ampicillin-sulbactam (UNASYN) IV 3 g (12/08/19 0907)  . ciprofloxacin 400 mg (12/07/19 2136)  . sodium chloride       Anti-infectives (From admission, onward)   Start     Dose/Rate Route Frequency Ordered Stop   12/05/19 0000  Ampicillin-Sulbactam (UNASYN) 3 g in sodium chloride 0.9 % 100 mL IVPB        3 g 200 mL/hr over 30 Minutes Intravenous Every 6 hours 12/04/19 1622     11/30/19 2000  ciprofloxacin (CIPRO) IVPB 400 mg        400 mg 200 mL/hr over 60 Minutes Intravenous Every 12 hours 11/30/19 1715  11/30/19 1800  ceFAZolin (ANCEF) IVPB 2g/100 mL premix  Status:  Discontinued        2 g 200 mL/hr over 30 Minutes Intravenous Every 8 hours 11/30/19 1715 12/04/19 1622   11/30/19 1800  clindamycin (CLEOCIN) IVPB 900 mg  Status:  Discontinued        900 mg 100 mL/hr over 30 Minutes Intravenous Every 8 hours 11/30/19 1715 12/04/19 1622   11/29/19 0600  Ampicillin-Sulbactam (UNASYN) 3 g in sodium chloride 0.9 % 100 mL IVPB  Status:  Discontinued        3 g 200 mL/hr over 30 Minutes Intravenous Every 6 hours 11/28/19 0918 11/30/19 1538   11/25/19 0800  cefTRIAXone (ROCEPHIN) 2 g in sodium chloride 0.9 % 100 mL IVPB  Status:  Discontinued        2  g 200 mL/hr over 30 Minutes Intravenous Every 24 hours 11/25/19 0544 11/28/19 0918   11/25/19 0600  vancomycin (VANCOREADY) IVPB 750 mg/150 mL  Status:  Discontinued        750 mg 150 mL/hr over 60 Minutes Intravenous Every 12 hours 11/24/19 2029 11/27/19 1525   11/24/19 2200  piperacillin-tazobactam (ZOSYN) IVPB 3.375 g  Status:  Discontinued        3.375 g 12.5 mL/hr over 240 Minutes Intravenous Every 8 hours 11/24/19 2026 11/25/19 0543   11/24/19 2015  piperacillin-tazobactam (ZOSYN) IVPB 3.375 g  Status:  Discontinued        3.375 g 100 mL/hr over 30 Minutes Intravenous  Once 11/24/19 2004 11/24/19 2026   11/24/19 2015  vancomycin (VANCOCIN) IVPB 1000 mg/200 mL premix  Status:  Discontinued        1,000 mg 200 mL/hr over 60 Minutes Intravenous  Once 11/24/19 2004 11/24/19 2025   11/24/19 1845  vancomycin (VANCOREADY) IVPB 2000 mg/400 mL        2,000 mg 200 mL/hr over 120 Minutes Intravenous  Once 11/24/19 1748 11/25/19 0130   11/24/19 1745  ceFEPIme (MAXIPIME) 2 g in sodium chloride 0.9 % 100 mL IVPB  Status:  Discontinued        2 g 200 mL/hr over 30 Minutes Intravenous Every 8 hours 11/24/19 1732 11/24/19 2025             Family Communication/Anticipated D/C date and plan/Code Status   DVT prophylaxis: IV heparin     Code Status: Full Code  Family Communication: Discussed with patient Disposition Plan:    Status is: Inpatient  Remains inpatient appropriate because:Ongoing diagnostic testing needed not appropriate for outpatient work up and Inpatient level of care appropriate due to severity of illness   Dispo: The patient is from: Home              Anticipated d/c is to: Home              Anticipated d/c date is: 3 days              Patient currently is not medically stable to d/c.           Subjective:   He still has pain in the left leg, left foot.  No shortness of breath or chest pain.  Objective:    Vitals:   12/08/19 0408 12/08/19 0410  12/08/19 0802 12/08/19 1153  BP: 125/60  113/67 (!) 100/52  Pulse: 61  (!) 59 (!) 57  Resp:   20 18  Temp: 98.7 F (37.1 C)  98.6 F (37 C) 97.9 F (36.6 C)  TempSrc: Oral  Oral Oral  SpO2: 96%  97% 98%  Weight:  (!) 171.7 kg    Height:       No data found.   Intake/Output Summary (Last 24 hours) at 12/08/2019 1414 Last data filed at 12/08/2019 1345 Gross per 24 hour  Intake 1490.5 ml  Output 3225 ml  Net -1734.5 ml   Filed Weights   12/03/19 0448 12/05/19 0424 12/08/19 0410  Weight: (!) 188.2 kg (!) 180 kg (!) 171.7 kg    Exam:  GEN: No acute distress SKIN: Warm and dry EYES: Anicteric, no pallor ENT: MMM CV: Regular rate and rhythm. PULM: No rales heard. ABD: Soft, protuberant, nontender CNS: Alert, oriented x3.  No focal deficits. EXT: Dressing on bilateral legs is clean and dry.  Left leg and left foot are very tender.  Swelling in bilateral legs have improved.  Data Reviewed:   I have personally reviewed following labs and imaging studies:  Labs: Labs show the following:   Basic Metabolic Panel: Recent Labs  Lab 12/03/19 0614 12/03/19 0614 12/04/19 0435 12/04/19 0435 12/05/19 0407 12/05/19 0407 12/06/19 0500 12/06/19 0500 12/07/19 0614 12/08/19 0035  NA 132*   < > 135  --  135  --  132*  --  134* 134*  K 3.8   < > 4.3   < > 4.4   < > 4.2   < > 4.4 4.4  CL 94*   < > 98  --  96*  --  95*  --  96* 98  CO2 27   < > 30  --  28  --  28  --  28 26  GLUCOSE 118*   < > 114*  --  110*  --  115*  --  104* 105*  BUN 14   < > 17  --  17  --  18  --  17 18  CREATININE 0.90   < > 0.92  --  0.87  --  1.01  --  1.06 1.08  CALCIUM 8.1*   < > 8.3*  --  8.3*  --  8.6*  --  8.7* 8.9  MG 2.1  --   --   --   --   --   --   --   --  2.3   < > = values in this interval not displayed.   GFR Estimated Creatinine Clearance: 139.7 mL/min (by C-G formula based on SCr of 1.08 mg/dL). Liver Function Tests: No results for input(s): AST, ALT, ALKPHOS, BILITOT, PROT, ALBUMIN  in the last 168 hours. No results for input(s): LIPASE, AMYLASE in the last 168 hours. No results for input(s): AMMONIA in the last 168 hours. Coagulation profile No results for input(s): INR, PROTIME in the last 168 hours.  CBC: Recent Labs  Lab 12/02/19 0532 12/02/19 0532 12/03/19 4076 12/04/19 0435 12/05/19 0407 12/06/19 0500 12/07/19 0614  WBC 12.3*   < > 11.4* 11.8* 11.3* 8.8 7.4  NEUTROABS 9.8*  --  9.0* 9.2* 8.7* 6.6  --   HGB 8.5*   < > 9.1* 8.7* 9.5* 9.1* 9.4*  HCT 26.8*   < > 29.0* 28.2* 30.2* 29.2* 30.7*  MCV 68.0*   < > 68.4* 70.0* 67.9* 68.2* 70.6*  PLT 252   < > 245 305 322 278 312   < > = values in this interval not displayed.   Cardiac Enzymes: No results for input(s): CKTOTAL, CKMB, CKMBINDEX, TROPONINI in the last 168 hours.  BNP (last 3 results) No results for input(s): PROBNP in the last 8760 hours. CBG: No results for input(s): GLUCAP in the last 168 hours. D-Dimer: No results for input(s): DDIMER in the last 72 hours. Hgb A1c: No results for input(s): HGBA1C in the last 72 hours. Lipid Profile: No results for input(s): CHOL, HDL, LDLCALC, TRIG, CHOLHDL, LDLDIRECT in the last 72 hours. Thyroid function studies: No results for input(s): TSH, T4TOTAL, T3FREE, THYROIDAB in the last 72 hours.  Invalid input(s): FREET3 Anemia work up: No results for input(s): VITAMINB12, FOLATE, FERRITIN, TIBC, IRON, RETICCTPCT in the last 72 hours. Sepsis Labs: Recent Labs  Lab 12/04/19 0435 12/05/19 0407 12/06/19 0500 12/07/19 0614  WBC 11.8* 11.3* 8.8 7.4    Microbiology Recent Results (from the past 240 hour(s))  CULTURE, BLOOD (ROUTINE X 2) w Reflex to ID Panel     Status: None   Collection Time: 12/01/19 12:49 AM   Specimen: BLOOD  Result Value Ref Range Status   Specimen Description BLOOD RFOA  Final   Special Requests BOTTLES DRAWN AEROBIC AND ANAEROBIC BCAV  Final   Culture   Final    NO GROWTH 5 DAYS Performed at [facility], [address] [place], [place] [address]    Report Status 12/06/2019 FINAL  Final  CULTURE, BLOOD (ROUTINE X 2) w Reflex to ID Panel     Status: None   Collection Time: 12/01/19  1:05 AM   Specimen: BLOOD  Result Value Ref Range Status   Specimen Description BLOOD LEFT HAND  Final   Special Requests BOTTLES DRAWN AEROBIC AND ANAEROBIC BCAV  Final   Culture   Final    NO GROWTH 5 DAYS Performed at [facility], [address] [place], [place] [address]    Report Status 12/06/2019 FINAL  Final    Procedures and diagnostic studies:  CT TIBIA FIBULA LEFT W CONTRAST  Result Date: 12/07/2019 CLINICAL DATA:  Bilateral lower extremity edema, question abscess EXAM: CT OF THE LOWER RIGHT EXTREMITY WITH CONTRAST TECHNIQUE: Multidetector CT imaging of the lower right extremity was performed according to the standard protocol following intravenous contrast administration. COMPARISON:  None. CONTRAST:  OMNIPAQUE IOHEXOL 300 MG/ML  SOLN FINDINGS: Bones/Joint/Cartilage No fracture or dislocation. Mild femoroacetabular and medial compartment osteoarthritis with joint space loss and marginal osteophyte formation. There is enthesophytes seen at the medial femoral condyle, likely from prior ligamentous injury. Ligaments Suboptimally assessed by CT. Muscles and Tendons The muscles surrounding the lower extremity intact without focal atrophy or tear. The tendons surrounding the lower extremity appear to be intact. Soft tissues There is diffuse skin thickening and subcutaneous edema seen surrounding the extremity. Loculated seen the anterolateral aspect of the distal femur and knee. There is also diffuse subcutaneous edema seen around the ankle with areas of induration the medial portion the ankle. Along the medial portion the ankle there is a focal area possible ulceration and a loculated collection measuring 1.7 x 0.7 x 5.2 cm and extends along the anterior distal tibia. There is small soft tissue  calcifications seen along the anterior tibia. There is also ossification along the posterior proximal fibula likely from prior injury. IMPRESSION: 1. Findings suggestive of diffuse cellulitis involving the lower extremity with subcutaneous edema/phlegmon surrounding the distal femur and knee. 2. There is also induration and a probable area of ulceration seen along the medial aspect of the ankle with a small loculated fluid collection measuring 1.7 x 0.7 x 5.2 cm as described above.  3. Scattered areas of soft tissue calcifications. Electronically Signed   By: Jonna Clark M.D.   On: 12/07/2019 20:29   CT FEMUR LEFT W CONTRAST  Result Date: 12/07/2019 CLINICAL DATA:  Bilateral lower extremity edema, question abscess EXAM: CT OF THE LOWER RIGHT EXTREMITY WITH CONTRAST TECHNIQUE: Multidetector CT imaging of the lower right extremity was performed according to the standard protocol following intravenous contrast administration. COMPARISON:  None. CONTRAST:  OMNIPAQUE IOHEXOL 300 MG/ML  SOLN FINDINGS: Bones/Joint/Cartilage No fracture or dislocation. Mild femoroacetabular and medial compartment osteoarthritis with joint space loss and marginal osteophyte formation. There is enthesophytes seen at the medial femoral condyle, likely from prior ligamentous injury. Ligaments Suboptimally assessed by CT. Muscles and Tendons The muscles surrounding the lower extremity intact without focal atrophy or tear. The tendons surrounding the lower extremity appear to be intact. Soft tissues There is diffuse skin thickening and subcutaneous edema seen surrounding the extremity. Loculated seen the anterolateral aspect of the distal femur and knee. There is also diffuse subcutaneous edema seen around the ankle with areas of induration the medial portion the ankle. Along the medial portion the ankle there is a focal area possible ulceration and a loculated collection measuring 1.7 x 0.7 x 5.2 cm and extends along the anterior distal  tibia. There is small soft tissue calcifications seen along the anterior tibia. There is also ossification along the posterior proximal fibula likely from prior injury. IMPRESSION: 1. Findings suggestive of diffuse cellulitis involving the lower extremity with subcutaneous edema/phlegmon surrounding the distal femur and knee. 2. There is also induration and a probable area of ulceration seen along the medial aspect of the ankle with a small loculated fluid collection measuring 1.7 x 0.7 x 5.2 cm as described above. 3. Scattered areas of soft tissue calcifications. Electronically Signed   By: Jonna Clark M.D.   On: 12/07/2019 20:29   CT FOOT LEFT W CONTRAST  Result Date: 12/07/2019 CLINICAL DATA:  Bilateral lower extremity edema, question abscess EXAM: CT OF THE LOWER RIGHT EXTREMITY WITH CONTRAST TECHNIQUE: Multidetector CT imaging of the lower right extremity was performed according to the standard protocol following intravenous contrast administration. COMPARISON:  None. CONTRAST:  OMNIPAQUE IOHEXOL 300 MG/ML  SOLN FINDINGS: Bones/Joint/Cartilage No fracture or dislocation. Mild femoroacetabular and medial compartment osteoarthritis with joint space loss and marginal osteophyte formation. There is enthesophytes seen at the medial femoral condyle, likely from prior ligamentous injury. Ligaments Suboptimally assessed by CT. Muscles and Tendons The muscles surrounding the lower extremity intact without focal atrophy or tear. The tendons surrounding the lower extremity appear to be intact. Soft tissues There is diffuse skin thickening and subcutaneous edema seen surrounding the extremity. Loculated seen the anterolateral aspect of the distal femur and knee. There is also diffuse subcutaneous edema seen around the ankle with areas of induration the medial portion the ankle. Along the medial portion the ankle there is a focal area possible ulceration and a loculated collection measuring 1.7 x 0.7 x 5.2 cm and  extends along the anterior distal tibia. There is small soft tissue calcifications seen along the anterior tibia. There is also ossification along the posterior proximal fibula likely from prior injury. IMPRESSION: 1. Findings suggestive of diffuse cellulitis involving the lower extremity with subcutaneous edema/phlegmon surrounding the distal femur and knee. 2. There is also induration and a probable area of ulceration seen along the medial aspect of the ankle with a small loculated fluid collection measuring 1.7 x 0.7 x 5.2 cm as  described above. 3. Scattered areas of soft tissue calcifications. Electronically Signed   By: Jonna Clark M.D.   On: 12/07/2019 20:29   ECHO TEE  Result Date: 12/07/2019    TRANSESOPHOGEAL ECHO REPORT   Patient Name:   Alfred [name] Date of Exam: 12/07/2019 Medical Rec #:  [id number]            Height:       79.0 in Accession #:    [id number]           Weight:       396.8 lb Date of Birth:  1965-05-02            BSA:          3.049 m Patient Age:    54 years             BP:           114/60 mmHg Patient Gender: M                    HR:           62 bpm. Exam Location:  [facility] Procedure: Transesophageal Echo, Color Doppler, Cardiac Doppler and Saline            Contrast Bubble Study Indications:     R78.81 Bacteremia  History:         Patient has prior history of Echocardiogram examinations, most                  recent 11/27/2019.  Sonographer:     Humphrey Rolls RDCS (AE) Referring Phys:  [id number] Sondra Barges Diagnosing Phys: Julien Nordmann MD PROCEDURE: After discussion of the risks and benefits of a TEE, an informed consent was obtained from the patient. TEE procedure time was 40 minutes. The transesophogeal probe was passed without difficulty through the esophogus of the patient. Imaged were obtained with the patient in a left lateral decubitus position. Local oropharyngeal anesthetic was provided with Cetacaine and Benzocaine spray. Sedation performed by performing physician. Image  quality was excellent. The patient's vital signs; including heart rate, blood pressure, and oxygen saturation; remained stable throughout the procedure. The patient developed no complications during the procedure. IMPRESSIONS  1. Left ventricular ejection fraction, by estimation, is 55 . The left ventricle has normal function. The left ventricle has no regional wall motion abnormalities.  2. Right ventricular systolic function is normal. The right ventricular size is normal.  3. Left atrial size was mildly dilated. No left atrial/left atrial appendage thrombus was detected.  4. No valve vegetation noted.  5. Picc line catheter noted in the right atrium. Conclusion(s)/Recommendation(s): Normal biventricular function without evidence of hemodynamically significant valvular heart disease. FINDINGS  Left Ventricle: Left ventricular ejection fraction, by estimation, is 55 to 60%. The left ventricle has normal function. The left ventricle has no regional wall motion abnormalities. The left ventricular internal cavity size was normal in size. There is  no left ventricular hypertrophy. Right Ventricle: The right ventricular size is normal. No increase in right ventricular wall thickness. Right ventricular systolic function is normal. Left Atrium: Left atrial size was mildly dilated. No left atrial/left atrial appendage thrombus was detected. Right Atrium: Right atrial size was normal in size. Pericardium: There is no evidence of pericardial effusion. Mitral Valve: The mitral valve is normal in structure. Normal mobility of the mitral valve leaflets. Trivial mitral valve regurgitation. No evidence of mitral valve stenosis. Tricuspid Valve: The tricuspid valve is  normal in structure. Tricuspid valve regurgitation is not demonstrated. No evidence of tricuspid stenosis. Aortic Valve: The aortic valve is normal in structure. Aortic valve regurgitation is not visualized. No aortic stenosis is present. Pulmonic Valve: The pulmonic  valve was normal in structure. Pulmonic valve regurgitation is not visualized. No evidence of pulmonic stenosis. Aorta: The aortic root is normal in size and structure. Venous: The inferior vena cava is normal in size with greater than 50% respiratory variability, suggesting right atrial pressure of 3 mmHg. IAS/Shunts: No atrial level shunt detected by color flow Doppler. Agitated saline contrast was given intravenously to evaluate for intracardiac shunting. No ventricular septal defect is seen or detected. There is no evidence of an atrial septal defect. Julien Nordmann MD Electronically signed by Julien Nordmann MD Signature Date/Time: 12/07/2019/1:52:09 PM    Final                LOS: 14 days   Lottie Siska  [facility]   Pager on [email or web address]. If 7PM-7AM, please contact night-coverage at [email or web address]     12/08/2019, 2:14 PM

## 2019-12-08 NOTE — Progress Notes (Signed)
Progress Note  Patient Name: Alfred [name] Date of Encounter: 12/08/2019  Primary Cardiologist: Kirke Corin  Subjective   Maintaining sinus rhythm. No chest pain. Dyspnea about the same. He continues to report left leg pain. Net - 31 L for the admission with a documented - 2.8 L for the past 24 hours. Intake is minimally noted. Renal function remains stable. TEE 9/2 without vegetation.    Inpatient Medications    Scheduled Meds: . amiodarone  200 mg Oral BID  . [START ON 12/13/2019] amiodarone  200 mg Oral Daily  . Chlorhexidine Gluconate Cloth  6 each Topical Daily  . diclofenac Sodium  2 g Topical QID  . furosemide  60 mg Intravenous BID  . metoprolol tartrate  50 mg Oral BID  . polyethylene glycol  17 g Oral Daily  . potassium chloride  20 mEq Oral Daily  . senna  2 tablet Oral QHS  . sodium chloride flush  10-40 mL Intracatheter Q12H   Continuous Infusions: . sodium chloride 250 mL (12/07/19 1212)  . ampicillin-sulbactam (UNASYN) IV 3 g (12/07/19 2356)  . ciprofloxacin 400 mg (12/07/19 2136)  . heparin 2,500 Units/hr (12/08/19 0413)  . sodium chloride     PRN Meds: sodium chloride, acetaminophen **OR** acetaminophen, albuterol, bisacodyl, guaiFENesin, HYDROcodone-acetaminophen, ipratropium-albuterol, melatonin, morphine injection, ondansetron **OR** ondansetron (ZOFRAN) IV, sodium chloride flush   Vital Signs    Vitals:   12/07/19 1544 12/07/19 2128 12/08/19 0408 12/08/19 0410  BP: (!) 121/59 (!) 109/59 125/60   Pulse: 67 68 61   Resp: 18 18    Temp: 97.8 F (36.6 C) 98 F (36.7 C) 98.7 F (37.1 C)   TempSrc:  Oral Oral   SpO2: 99% 100% 96%   Weight:    (!) 171.7 kg  Height:        Intake/Output Summary (Last 24 hours) at 12/08/2019 0721 Last data filed at 12/08/2019 0700 Gross per 24 hour  Intake 1515.5 ml  Output 4325 ml  Net -2809.5 ml   Filed Weights   12/03/19 0448 12/05/19 0424 12/08/19 0410  Weight: (!) 188.2 kg (!) 180 kg (!) 171.7 kg     Telemetry    SR - Personally Reviewed  ECG    No  New tracings - Personally Reviewed  Physical Exam   GEN: No acute distress.   Neck: JVD difficult to assess secondary to body habitus. Cardiac: RRR, no murmurs, rubs, or gallops.  Respiratory: Diminished breath sounds bilaterally.  GI: Soft, nontender, non-distended.   MS: Bilateral lower extremities wrapped with significant TTP of the left lower extremity; No deformity. Neuro:  Alert and oriented x 3; Nonfocal.  Psych: Normal affect.  Labs    Chemistry Recent Labs  Lab 12/06/19 0500 12/07/19 0614 12/08/19 0035  NA 132* 134* 134*  K 4.2 4.4 4.4  CL 95* 96* 98  CO2 28 28 26   GLUCOSE 115* 104* 105*  BUN 18 17 18   CREATININE 1.01 1.06 1.08  CALCIUM 8.6* 8.7* 8.9  GFRNONAA >60 >60 >60  GFRAA >60 >60 >60  ANIONGAP 9 10 10      Hematology Recent Labs  Lab 12/05/19 0407 12/06/19 0500 12/07/19 0614  WBC 11.3* 8.8 7.4  RBC 4.45 4.28 4.35  HGB 9.5* 9.1* 9.4*  HCT 30.2* 29.2* 30.7*  MCV 67.9* 68.2* 70.6*  MCH 21.3* 21.3* 21.6*  MCHC 31.5 31.2 30.6  RDW 16.7* 16.8* 17.3*  PLT 322 278 312    Cardiac EnzymesNo results for input(s): TROPONINI  in the last 168 hours. No results for input(s): TROPIPOC in the last 168 hours.   BNPNo results for input(s): BNP, PROBNP in the last 168 hours.   DDimer No results for input(s): DDIMER in the last 168 hours.   Radiology    CT VENOGRAM ABD/PEL  Result Date: 12/05/2019 IMPRESSION: 1. Normal appearance of the venous structures in the abdomen and pelvis without compression, obstruction or thrombus. 2. Enlarged inguinal and pelvic lymph nodes, left side greater than right. In addition, there is subcutaneous edema in the left upper thigh. Lymphadenopathy could be reactive change from the chronic edema but this lymph node enlargement is nonspecific. If there is concern for a neoplastic process, the left inguinal lymph nodes would be amendable to biopsy. 3.  Aortic  Atherosclerosis (ICD10-I70.0). 4. Umbilical hernia containing fat. Minimal stranding associated with this umbilical hernia. Electronically Signed   By: Richarda Overlie M.D.   On: 12/05/2019 07:59    Cardiac Studies   2D echo 11/27/2019: 1. Left ventricular ejection fraction, by estimation, is 50 to 55%. The  left ventricle has low normal function. Left ventricular endocardial  border not optimally defined to evaluate regional wall motion. The left  ventricular internal cavity size was  mildly dilated. There is moderate left ventricular hypertrophy. Left  ventricular diastolic function could not be evaluated.  2. Right ventricular systolic function is normal. The right ventricular  size is normal. Tricuspid regurgitation signal is inadequate for assessing  PA pressure.  3. Left atrial size was mildly dilated.  4. The mitral valve is normal in structure. No evidence of mitral valve  regurgitation. No evidence of mitral stenosis.  5. The aortic valve is normal in structure. Aortic valve regurgitation is  not visualized. No aortic stenosis is present.  6. Very challenging study with limited views and significant tachycardia  during the exam.   Patient Profile     54 y.o. male with history of morbid obesity, lower extremity edema, and tobacco use admitted 8/20 with worsening lower extremity swelling, acute on chronic HFpEF, group B streptococcus bacteremia and subsequently developed atrial flutter on 8/21 with spontaneous conversion to sinus rhythm on 8/24  Assessment & Plan    1. Atrial flutter: -Developed 8/21 with spontaneous conversion to sinus 8/24, maintaining sinus rhythm -Now on amiodarone 200 mg bid x 1 week, then 200 mg daily thereafter, though ideally would not use this long term given his young age -Lopressor 50 mg bid -Continue heparin gtt for now -CHADS2VASc at least 2 (CHF, vascular disease) -Care manager has been consulted to assist with anticoagulation recommendations    2. Acute on chronic HFpEF: -Volume status is difficult to assess on exam due to morbid obesity -Continue IV Lasix until there are signs of intravascular volume depletion  -Daily weights -Strict I/O -CHF education  3. Group B streptococcus bacteremia: -IV antibiotics per IM/ID -TEE 9/2 without evidence of vegetation   4. Left leg pain: -Lower extremity ultrasound negative for DVT this admission, though imaging was suboptimal  -Consider CT for evaluation of abscess, defer to IM   For questions or updates, please contact [facility] Please consult [email or web address] for contact info under Cardiology/STEMI.    Signed, Eula Listen, PA-C [facility] Pager: [phone] 12/08/2019, 7:21 AM

## 2019-12-08 NOTE — Progress Notes (Addendum)
Heart Failure Nurse Navigator Note:  HFpEF (50-55%)  He is awake and alert, lying in bed.  States that he continues" to pee a lot."  Denies any increasing SOB, PND,orthopnea or early satiety.  He states he did not get up in the chair yesterday due to the TEE but today he ambulated to the nurses station and back with rolling walker and sat up in the  chair.  Today viewed videos - What to discuss with your doctor and Water pills and fluids.  His comorbidity's include:  Morbid obesity Paroxsymal atrial flutter   Prior to admission-tobacco abuse.  Weight today 171.9 kg (yesterday 180 kg).  Blood work today:  Sodium 134, potassium 4.4, BUN 18 , creatinine 30.7, magnesium 2.3.    On admission he was not on medications.  During this hospitalization his heart failure medications include:  Lasix 60 mg IV BID Metoprolol tartate 50 mg BID  The heparin infusion is being discontinued today and will start Eliquis po.  Will continue to follow patient during this hospitalization.   Tresa Endo RN, CHFN

## 2019-12-08 NOTE — Progress Notes (Signed)
ANTICOAGULATION CONSULT NOTE  Pharmacy Consult for heparin Indication: atrial flutter  No Known Allergies  Patient Measurements: Height: 6\' 7"  (200.7 cm) Weight: (!) 180 kg (396 lb 13.3 oz) IBW/kg (Calculated) : 93.7 Heparin Dosing Weight: 141 kg (heparin adjusted body weight), initially used 113 kg  Vital Signs: Temp: 98 F (36.7 C) (09/02 2128) Temp Source: Oral (09/02 2128) BP: 109/59 (09/02 2128) Pulse Rate: 68 (09/02 2128)  Labs: Recent Labs    12/05/19 0407 12/05/19 0407 12/06/19 0500 12/06/19 1430 12/07/19 0614 12/07/19 0614 12/07/19 1221 12/07/19 1744 12/08/19 0035  HGB 9.5*   < > 9.1*  --  9.4*  --   --   --   --   HCT 30.2*  --  29.2*  --  30.7*  --   --   --   --   PLT 322  --  278  --  312  --   --   --   --   HEPARINUNFRC 0.61   < > 0.99*   < > 0.11*   < > 0.61 0.72* 0.83*  CREATININE 0.87   < > 1.01  --  1.06  --   --   --  1.08   < > = values in this interval not displayed.    Estimated Creatinine Clearance: 143.4 mL/min (by C-G formula based on SCr of 1.08 mg/dL).   Medical History: Past Medical History:  Diagnosis Date  . Lower extremity edema   . Morbid obesity (HCC) 11/27/2019  . Tobacco abuse 11/27/2019    Assessment: 54 year old patient with strep group G bacteremia. Pt presented with atrial flutter with RVR on amiodarone and diltiazem. Pt is now NSR on amiodarone. PMH includes CHF. CBC stable. Pharmacy consulted for monitoring and adjustment of heparin drip.  8/28 2303 HL 0.12 - rate increased to 2900 units/hr 8/29 0614 HL 0.33 - therapeutic 8/29 1227 HL 0.38 - therapeutic 8/31 0407 HL 0.61- therapeutic 9/01 0500 HL 0.99 - rate decreased to 2500 units/hr 9/01 1430 HL 0.29 - rate increased to 2750 units/hr  9/01 2230 HL 0.59 - therapeutic x 1 - continue current rate of 2750 units/hr 9/02 0614 HL 0.11 - possible lab error? - recheck HL 9/02 1221 HL 0.61 - therapeutic - continue current rate 2750 units/hr   Goal of Therapy:  Heparin  level 0.3-0.7 units/ml Monitor platelets by anticoagulation protocol: Yes   Plan:  - 0903 @ 0035 HL 0.83. Level is supratherapeutic despite rate reduction earlier - Will reduce Heparin rate to 2500 units/hr and recheck HL in 6 hrs - Monitor CBC with AM labs   11/02, PharmD Clinical Pharmacist 12/08/2019 2:17 AM

## 2019-12-08 NOTE — Plan of Care (Signed)
  Problem: Health Behavior/Discharge Planning: Goal: Ability to manage health-related needs will improve Outcome: Progressing   Problem: Clinical Measurements: Goal: Will remain free from infection Outcome: Progressing Goal: Cardiovascular complication will be avoided Outcome: Progressing   Problem: Pain Managment: Goal: General experience of comfort will improve Outcome: Progressing   Problem: Safety: Goal: Ability to remain free from injury will improve Outcome: Progressing

## 2019-12-08 NOTE — Progress Notes (Signed)
Date of Admission:  11/24/2019    History ofchronic venous edema of the legs, hepatitis C RNA positive, presented from home with swelling of the legs for 2 to 3 weeks duration with oozing and pain in the legs and inability to walk. He also had severe diarrhea on the day of presentation which was on 11/24/2019. He denied fever at home. But in the ED his temperature was initially 99.5 and increased to 104,blood pressure was 139/95,Blood cultures were sent and he was started on vancomycin and cefepime. He also was noted to have fast A. fib. He was  in ICU.Now in sinus rhythm and out of ICU    blood culture  positive for Streptococcus group G bacteria.He has been on unasyn to cover both bacteremia and leg lesions- He had culture of one of the punched out lesions and it is growing pseudomonas He was having intermittent fever and it has resolved now    ID: [employer] Gaffin is a 54 y.o. male  Principal Problem:   Streptococcal sepsis, unspecified (HCC) Active Problems:   Cellulitis of left leg   AKI (acute kidney injury) (HCC)   Bacteremia   Lower extremity edema   Morbid obesity (HCC)   Tobacco abuse   Paroxysmal atrial fibrillation (HCC)   Acute on chronic heart failure with preserved ejection fraction (HFpEF) (HCC)   Atrial flutter (HCC)    Subjective: Says he is feeling better Left leg is hurting still but better than before  Medications:  . amiodarone  200 mg Oral BID  . [START ON 12/13/2019] amiodarone  200 mg Oral Daily  . apixaban  5 mg Oral BID  . Chlorhexidine Gluconate Cloth  6 each Topical Daily  . diclofenac Sodium  2 g Topical QID  . furosemide  60 mg Intravenous BID  . metoprolol tartrate  50 mg Oral BID  . polyethylene glycol  17 g Oral Daily  . potassium chloride  20 mEq Oral Daily  . senna  2 tablet Oral QHS  . sodium chloride flush  10-40 mL Intracatheter Q12H    Objective: Vital signs in last 24 hours: Temp:  [97.8 F (36.6 C)-98.7 F (37.1 C)]  97.8 F (36.6 C) (09/03 1442) Pulse Rate:  [57-68] 62 (09/03 1442) Resp:  [18-20] 18 (09/03 1442) BP: (100-125)/(52-67) 109/58 (09/03 1442) SpO2:  [95 %-100 %] 95 % (09/03 1442) Weight:  [171.7 kg] 171.7 kg (09/03 0410)  PHYSICAL EXAM:  General: Alert, cooperative, no distress, appears stated age.  Lungs: b/l air entry Heart: s1s2 Abdomen: Soft, non-tender,not distended. Bowel sounds normal. No masses Extremities: both legs - wrapped  Lymph: Cervical, supraclavicular normal. Neurologic: Grossly non-focal  Lab Results Recent Labs    12/06/19 0500 12/06/19 0500 12/07/19 0614 12/08/19 0035  WBC 8.8  --  7.4  --   HGB 9.1*  --  9.4*  --   HCT 29.2*  --  30.7*  --   NA 132*   < > 134* 134*  K 4.2   < > 4.4 4.4  CL 95*   < > 96* 98  CO2 28   < > 28 26  BUN 18   < > 17 18  CREATININE 1.01   < > 1.06 1.08   < > = values in this interval not displayed.    Microbiology: 11/24/19 Streptococcus Group G in 1 set of blood culture Other set is negative  11/28/19 Bc- Ng 11/27/19- Wound culture pseudomonas Studies/Results: CT TIBIA FIBULA LEFT  W CONTRAST  Result Date: 12/07/2019 CLINICAL DATA:  Bilateral lower extremity edema, question abscess EXAM: CT OF THE LOWER RIGHT EXTREMITY WITH CONTRAST TECHNIQUE: Multidetector CT imaging of the lower right extremity was performed according to the standard protocol following intravenous contrast administration. COMPARISON:  None. CONTRAST:  OMNIPAQUE IOHEXOL 300 MG/ML  SOLN FINDINGS: Bones/Joint/Cartilage No fracture or dislocation. Mild femoroacetabular and medial compartment osteoarthritis with joint space loss and marginal osteophyte formation. There is enthesophytes seen at the medial femoral condyle, likely from prior ligamentous injury. Ligaments Suboptimally assessed by CT. Muscles and Tendons The muscles surrounding the lower extremity intact without focal atrophy or tear. The tendons surrounding the lower extremity appear to be  intact. Soft tissues There is diffuse skin thickening and subcutaneous edema seen surrounding the extremity. Loculated seen the anterolateral aspect of the distal femur and knee. There is also diffuse subcutaneous edema seen around the ankle with areas of induration the medial portion the ankle. Along the medial portion the ankle there is a focal area possible ulceration and a loculated collection measuring 1.7 x 0.7 x 5.2 cm and extends along the anterior distal tibia. There is small soft tissue calcifications seen along the anterior tibia. There is also ossification along the posterior proximal fibula likely from prior injury. IMPRESSION: 1. Findings suggestive of diffuse cellulitis involving the lower extremity with subcutaneous edema/phlegmon surrounding the distal femur and knee. 2. There is also induration and a probable area of ulceration seen along the medial aspect of the ankle with a small loculated fluid collection measuring 1.7 x 0.7 x 5.2 cm as described above. 3. Scattered areas of soft tissue calcifications. Electronically Signed   By: Jonna Clark M.D.   On: 12/07/2019 20:29   CT FEMUR LEFT W CONTRAST  Result Date: 12/07/2019 CLINICAL DATA:  Bilateral lower extremity edema, question abscess EXAM: CT OF THE LOWER RIGHT EXTREMITY WITH CONTRAST TECHNIQUE: Multidetector CT imaging of the lower right extremity was performed according to the standard protocol following intravenous contrast administration. COMPARISON:  None. CONTRAST:  OMNIPAQUE IOHEXOL 300 MG/ML  SOLN FINDINGS: Bones/Joint/Cartilage No fracture or dislocation. Mild femoroacetabular and medial compartment osteoarthritis with joint space loss and marginal osteophyte formation. There is enthesophytes seen at the medial femoral condyle, likely from prior ligamentous injury. Ligaments Suboptimally assessed by CT. Muscles and Tendons The muscles surrounding the lower extremity intact without focal atrophy or tear. The tendons  surrounding the lower extremity appear to be intact. Soft tissues There is diffuse skin thickening and subcutaneous edema seen surrounding the extremity. Loculated seen the anterolateral aspect of the distal femur and knee. There is also diffuse subcutaneous edema seen around the ankle with areas of induration the medial portion the ankle. Along the medial portion the ankle there is a focal area possible ulceration and a loculated collection measuring 1.7 x 0.7 x 5.2 cm and extends along the anterior distal tibia. There is small soft tissue calcifications seen along the anterior tibia. There is also ossification along the posterior proximal fibula likely from prior injury. IMPRESSION: 1. Findings suggestive of diffuse cellulitis involving the lower extremity with subcutaneous edema/phlegmon surrounding the distal femur and knee. 2. There is also induration and a probable area of ulceration seen along the medial aspect of the ankle with a small loculated fluid collection measuring 1.7 x 0.7 x 5.2 cm as described above. 3. Scattered areas of soft tissue calcifications. Electronically Signed   By: Jonna Clark M.D.   On: 12/07/2019 20:29   CT  FOOT LEFT W CONTRAST  Result Date: 12/07/2019 CLINICAL DATA:  Bilateral lower extremity edema, question abscess EXAM: CT OF THE LOWER RIGHT EXTREMITY WITH CONTRAST TECHNIQUE: Multidetector CT imaging of the lower right extremity was performed according to the standard protocol following intravenous contrast administration. COMPARISON:  None. CONTRAST:  125mL OMNIPAQUE IOHEXOL 300 MG/ML  SOLN FINDINGS: Bones/Joint/Cartilage No fracture or dislocation. Mild femoroacetabular and medial compartment osteoarthritis with joint space loss and marginal osteophyte formation. There is enthesophytes seen at the medial femoral condyle, likely from prior ligamentous injury. Ligaments Suboptimally assessed by CT. Muscles and Tendons The muscles surrounding the lower extremity intact without  focal atrophy or tear. The tendons surrounding the lower extremity appear to be intact. Soft tissues There is diffuse skin thickening and subcutaneous edema seen surrounding the extremity. Loculated seen the anterolateral aspect of the distal femur and knee. There is also diffuse subcutaneous edema seen around the ankle with areas of induration the medial portion the ankle. Along the medial portion the ankle there is a focal area possible ulceration and a loculated collection measuring 1.7 x 0.7 x 5.2 cm and extends along the anterior distal tibia. There is small soft tissue calcifications seen along the anterior tibia. There is also ossification along the posterior proximal fibula likely from prior injury. IMPRESSION: 1. Findings suggestive of diffuse cellulitis involving the lower extremity with subcutaneous edema/phlegmon surrounding the distal femur and knee. 2. There is also induration and a probable area of ulceration seen along the medial aspect of the ankle with a small loculated fluid collection measuring 1.7 x 0.7 x 5.2 cm as described above. 3. Scattered areas of soft tissue calcifications. Electronically Signed   By: Jonna ClarkBindu  Avutu M.D.   On: 12/07/2019 20:29   ECHO TEE  Result Date: 12/07/2019    TRANSESOPHOGEAL ECHO REPORT   Patient Name:   Doristine JohnsHOMAS JUNIOR Bayron Date of Exam: 12/07/2019 Medical Rec #:  [id number]            Height:       79.0 in Accession #:    [id number]           Weight:       396.8 lb Date of Birth:  [birth year]            BSA:          3.049 m Patient Age:    54 years             BP:           114/60 mmHg Patient Gender: M                    HR:           62 bpm. Exam Location:  [facility] Procedure: Transesophageal Echo, Color Doppler, Cardiac Doppler and Saline            Contrast Bubble Study Indications:     R78.81 Bacteremia  History:         Patient has prior history of Echocardiogram examinations, most                  recent 11/27/2019.  Sonographer:     Humphrey RollsJoan Heiss RDCS (AE) Referring  Phys:  [id number] Sondra BargesYAN M DUNN Diagnosing Phys: Julien Nordmannimothy Gollan MD PROCEDURE: After discussion of the risks and benefits of a TEE, an informed consent was obtained from the patient. TEE procedure time was 40 minutes. The transesophogeal probe was passed without difficulty through the esophogus of  the patient. Imaged were obtained with the patient in a left lateral decubitus position. Local oropharyngeal anesthetic was provided with Cetacaine and Benzocaine spray. Sedation performed by performing physician. Image quality was excellent. The patient's vital signs; including heart rate, blood pressure, and oxygen saturation; remained stable throughout the procedure. The patient developed no complications during the procedure. IMPRESSIONS  1. Left ventricular ejection fraction, by estimation, is 55 . The left ventricle has normal function. The left ventricle has no regional wall motion abnormalities.  2. Right ventricular systolic function is normal. The right ventricular size is normal.  3. Left atrial size was mildly dilated. No left atrial/left atrial appendage thrombus was detected.  4. No valve vegetation noted.  5. Picc line catheter noted in the right atrium. Conclusion(s)/Recommendation(s): Normal biventricular function without evidence of hemodynamically significant valvular heart disease. FINDINGS  Left Ventricle: Left ventricular ejection fraction, by estimation, is 55 to 60%. The left ventricle has normal function. The left ventricle has no regional wall motion abnormalities. The left ventricular internal cavity size was normal in size. There is  no left ventricular hypertrophy. Right Ventricle: The right ventricular size is normal. No increase in right ventricular wall thickness. Right ventricular systolic function is normal. Left Atrium: Left atrial size was mildly dilated. No left atrial/left atrial appendage thrombus was detected. Right Atrium: Right atrial size was normal in size. Pericardium: There is no  evidence of pericardial effusion. Mitral Valve: The mitral valve is normal in structure. Normal mobility of the mitral valve leaflets. Trivial mitral valve regurgitation. No evidence of mitral valve stenosis. Tricuspid Valve: The tricuspid valve is normal in structure. Tricuspid valve regurgitation is not demonstrated. No evidence of tricuspid stenosis. Aortic Valve: The aortic valve is normal in structure. Aortic valve regurgitation is not visualized. No aortic stenosis is present. Pulmonic Valve: The pulmonic valve was normal in structure. Pulmonic valve regurgitation is not visualized. No evidence of pulmonic stenosis. Aorta: The aortic root is normal in size and structure. Venous: The inferior vena cava is normal in size with greater than 50% respiratory variability, suggesting right atrial pressure of 3 mmHg. IAS/Shunts: No atrial level shunt detected by color flow Doppler. Agitated saline contrast was given intravenously to evaluate for intracardiac shunting. No ventricular septal defect is seen or detected. There is no evidence of an atrial septal defect. Julien Nordmann MD Electronically signed by Julien Nordmann MD Signature Date/Time: 12/07/2019/1:52:09 PM    Final      Assessment/Plan: Streptococcus Group G bacteremia On ampicillin/sulbactam   Sourc-likely  leg TEE :No vegetation On unasyn- until 12/11/19   cellulitis left leg-with venous edema and blisters  Bilateral venous edema with punched-out lesions on the right leg and circular lesions on the left leg. Pseudomonas in culture- initially thought it was colonization but because of persistent fever  added cipro X 7 days now- will evaulate him on Monday and decide on discontinuing antibiotics And no fever now Seen by podiatrist and ortho and MRI of the leg ahs been ordered   A. fib-reverted to sinus On Amiodarone Watch while on cipro  AKI on presentation resolved.  Anemia  ActiveHepatitis C infectionViral load of 2.5 million  copies Will need Rx as OP  Discussed the management with patient and hospitalist

## 2019-12-08 NOTE — Progress Notes (Signed)
ANTICOAGULATION CONSULT NOTE  Pharmacy Consult for heparin Indication: atrial flutter  No Known Allergies  Patient Measurements: Height: 6\' 7"  (200.7 cm) Weight: (!) 171.7 kg (378 lb 8.5 oz) IBW/kg (Calculated) : 93.7 Heparin Dosing Weight: 141 kg (heparin adjusted body weight), initially used 113 kg  Vital Signs: Temp: 98.6 F (37 C) (09/03 0802) Temp Source: Oral (09/03 0802) BP: 113/67 (09/03 0802) Pulse Rate: 59 (09/03 0802)  Labs: Recent Labs    12/06/19 0500 12/06/19 1430 12/07/19 0614 12/07/19 1221 12/07/19 1744 12/08/19 0035 12/08/19 0832  HGB 9.1*  --  9.4*  --   --   --   --   HCT 29.2*  --  30.7*  --   --   --   --   PLT 278  --  312  --   --   --   --   HEPARINUNFRC 0.99*   < > 0.11*   < > 0.72* 0.83* 0.84*  CREATININE 1.01  --  1.06  --   --  1.08  --    < > = values in this interval not displayed.    Estimated Creatinine Clearance: 139.7 mL/min (by C-G formula based on SCr of 1.08 mg/dL).   Medical History: Past Medical History:  Diagnosis Date  . Lower extremity edema   . Morbid obesity (HCC) 11/27/2019  . Tobacco abuse 11/27/2019    Assessment: 55 year old patient with strep group G bacteremia. Pt presented with atrial flutter with RVR on amiodarone and diltiazem. Pt is now NSR on amiodarone. PMH includes CHF. CBC stable. Pharmacy consulted for monitoring and adjustment of heparin drip.  8/28 2303 HL 0.12 - rate increased to 2900 units/hr 8/29 0614 HL 0.33 - therapeutic 8/29 1227 HL 0.38 - therapeutic 8/31 0407 HL 0.61- therapeutic 9/01 0500 HL 0.99 - rate decreased to 2500 units/hr 9/01 1430 HL 0.29 - rate increased to 2750 units/hr  9/01 2230 HL 0.59 - therapeutic x 1 - continue current rate of 2750 units/hr 9/02 0614 HL 0.11 - possible lab error? - recheck HL 9/02 1221 HL 0.61 - therapeutic - continue current rate 2750 units/hr 9/03 0305 HL 0.83 - supratherapeutic - rate decreased to 2500 units/hr 9/03 0832 HL 0.84 - supratherapeutic -  rate decreased to 2250 units/hr  Goal of Therapy:  Heparin level 0.3-0.7 units/ml Monitor platelets by anticoagulation protocol: Yes   Plan:  - 0903 @ 0832 HL 0.84 - Level is supratherapeutic despite rate reduction earlier - Will reduce Heparin rate to 2250 units/hr and  - Recheck HL in 6 hrs - Monitor CBC with AM labs   11/03, PharmD Pharmacy Resident  12/08/2019 9:34 AM

## 2019-12-09 ENCOUNTER — Inpatient Hospital Stay: Payer: Self-pay

## 2019-12-09 DIAGNOSIS — I5030 Unspecified diastolic (congestive) heart failure: Secondary | ICD-10-CM

## 2019-12-09 LAB — CBC
HCT: 31.1 % — ABNORMAL LOW (ref 39.0–52.0)
Hemoglobin: 9.8 g/dL — ABNORMAL LOW (ref 13.0–17.0)
MCH: 21.5 pg — ABNORMAL LOW (ref 26.0–34.0)
MCHC: 31.5 g/dL (ref 30.0–36.0)
MCV: 68.2 fL — ABNORMAL LOW (ref 80.0–100.0)
Platelets: 310 10*3/uL (ref 150–400)
RBC: 4.56 MIL/uL (ref 4.22–5.81)
RDW: 17.2 % — ABNORMAL HIGH (ref 11.5–15.5)
WBC: 6.7 10*3/uL (ref 4.0–10.5)
nRBC: 0 % (ref 0.0–0.2)

## 2019-12-09 MED ORDER — GADOBUTROL 1 MMOL/ML IV SOLN
10.0000 mL | Freq: Once | INTRAVENOUS | Status: AC | PRN
  Administered 2019-12-09: 10 mL via INTRAVENOUS

## 2019-12-09 MED ORDER — TORSEMIDE 20 MG PO TABS
40.0000 mg | ORAL_TABLET | Freq: Two times a day (BID) | ORAL | Status: DC
  Administered 2019-12-09 – 2019-12-11 (×4): 40 mg via ORAL
  Filled 2019-12-09 (×4): qty 2

## 2019-12-09 NOTE — Progress Notes (Signed)
Subjective: Patient states that his left lower extremity feels relatively similar to yesterday.  No new fevers or chills.  Objective: Vital signs in last 24 hours: Temp:  [97.8 F (36.6 C)-98.5 F (36.9 C)] 98 F (36.7 C) (09/04 0920) Pulse Rate:  [57-69] 62 (09/04 0920) Resp:  [18-20] 18 (09/04 0920) BP: (100-138)/(52-69) 138/69 (09/04 0920) SpO2:  [95 %-100 %] 100 % (09/04 0920) Weight:  [170 kg] 170 kg (09/04 0505)  Intake/Output from previous day: 09/03 0701 - 09/04 0700 In: 1300 [P.O.:1200; IV Piggyback:100] Out: 3150 [Urine:3150] Intake/Output this shift: No intake/output data recorded.  Recent Labs    12/07/19 0614 12/09/19 0500  HGB 9.4* 9.8*   Recent Labs    12/07/19 0614 12/09/19 0500  WBC 7.4 6.7  RBC 4.35 4.56  HCT 30.7* 31.1*  PLT 312 310   Recent Labs    12/07/19 0614 12/08/19 0035  NA 134* 134*  K 4.4 4.4  CL 96* 98  CO2 28 26  BUN 17 18  CREATININE 1.06 1.08  GLUCOSE 104* 105*  CALCIUM 8.7* 8.9   No results for input(s): LABPT, INR in the last 72 hours.  Exam: LLE: 5/5 DF/PF/EHL SILT s/s/sp/dp distr, mild numbness about the tibial distribution along the sole of the foot Foot wwp, positive DP pulse RoM knee: 0-110 without significant pain. Significant brawny edema and chronic venous stasis changes about leg.  Mild focal swelling about the lateral aspect of the knee tracking slightly proximally.  This region is not particularly tender or erythematous.  There is no gross fluid deep to this region.  The patient is more significantly tender along the lateral aspect of the thigh more proximal to this region along the entire course of the IT band. Patient able to perform straight leg raise, but with significant difficulty Minimal pain about the medial and lateral joint lines.  No significant pain about medial femoral condyle.  Imaging: MRI L knee (12/09/19): IMPRESSION: 1. Subcutaneous edema diffusely in the upper calf, and  primarily posteriorly and medially in the lower thigh. Cellulitis not excluded. 2. Small joint effusion extending into the suprapatellar bursa. In this setting, it is difficult to exclude the possibility of septic joint by imaging, but there is less synovitis than is typically encountered in the setting of septic joint. 3. Free edge truncation of the midbody medial meniscus compatible with radial tear. Diminutive adjacent portions of the posterior and anterior horn with indistinctness of significant segments of the posterior horn especially near the meniscal root likely reflecting additional degenerative tearing. 4. Meniscal flounce in the posterior horn lateral meniscus with suspected free edge tear in the midbody. 5. Small focal tear in the proximal medial collateral ligament. Although torn, the proximal MCL is not detached. 6. Accentuated signal in the anterior cruciate ligament suggesting degeneration but without overt tear identified. 7. edema tracks along the superficial fascia margin of the posterior compartmental musculature of the calf (along the gastrocnemius muscles) for example on image 34/9. 8. There is some subcortical marrow edema along the medial femoral epicondyle in the vicinity of the attachment of the medial patellofemoral ligament, which could be a secondary sign of injury but could also simply be from low-grade bone bruising. 9. Despite efforts by the technologist and patient, motion artifact is present on today's exam and could not be eliminated. This reduces exam sensitivity and specificity.  I have also reviewed the MRI images.  There is no focal fluid collection or acute process that appears to necessitate surgical  intervention.  Assessment/Plan: 54 year old male with history of CHF and cellulitis that has been treated for the past 2 weeks while he has been an inpatient.  He has persistent left lower extremity pain.  CT scan shows possible fluid collection  about the anterolateral aspect of the distal thigh/knee.  MRI from today did not demonstrate fluid collection.  It did show significant diffuse lower extremity edema and possible cellulitis type changes.  1.  No acute surgical intervention about the knee recommended at this time as there are no clinical signs of septic knee or significant concern for abscess/fluid collection amenable to surgical drainage.  Defer to Dr. Excell Seltzer with podiatry for any further intervention for the ankle/foot.  2.  IV antibiotics per ID and primary medical team  3.  Recommend PT/OT as able and tolerated.  No restriction on weightbearing from orthopedic perspective.  4.  Other findings on MRI can be evaluated as an outpatient.  He does not appear symptomatic from these findings currently.  5.  We will plan to sign off and follow peripherally.  Please page with any further questions.      Signa Kell 12/09/2019, 10:06 AM

## 2019-12-09 NOTE — Progress Notes (Signed)
[facility] PROGRESS NOTE  Requesting Physician: Dr. Myriam Forehand  Reason for consult: LLE cellulitis, swelling, possible abscess  Chief Complaint: L leg and foot pain/swelling   HPI: Alfred [name] is a 54 y.o. male who presents today resting in bed comfortably.  Patient is currently awaiting to go down to have the left foot and ankle MRI performed.  Patient denies any complaints today other than the same mild pain to the left lower extremity.  Patient does note that his leg pain has improved though since admission.  PMHx:  Past Medical History:  Diagnosis Date  . Lower extremity edema   . Morbid obesity (HCC) 11/27/2019  . Tobacco abuse 11/27/2019    Surgical Hx:  Past Surgical History:  Procedure Laterality Date  . DEBRIDEMENT OF ABDOMINAL WALL ABSCESS    . TEE WITHOUT CARDIOVERSION N/A 12/07/2019   Procedure: TRANSESOPHAGEAL ECHOCARDIOGRAM (TEE);  Surgeon: Antonieta Iba, MD;  Location: [facility] ORS;  Service: Cardiovascular;  Laterality: N/A;    FHx:  Family History  Problem Relation Age of Onset  . Heart disease Mother        died in her 72's  . Heart disease Father        died in his 27's    Social History:  reports that he has been smoking cigarettes. He has been smoking about 0.10 packs per day. He has never used smokeless tobacco. He reports current alcohol use. He reports current drug use. Drug: Marijuana.  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 acetaminophen (TYLENOL) 325 MG tablet Take by mouth.    [facility] lisinopril (ZESTRIL) 20 MG tablet Take by mouth. (Patient not taking: Reported on 11/29/2019)      Physical Exam: General: Alert and oriented.  No apparent distress.  Dressings to the left foot appear to be clean, dry, and intact and same to the right.  Compression wraps left on today.  Results for orders placed or performed during the hospital encounter of 11/24/19 (from the past 48 hour(s))  Heparin level (unfractionated)     Status: Abnormal   Collection Time: 12/07/19  5:44 PM  Result Value Ref Range   Heparin Unfractionated 0.72 (H) 0.30 - 0.70 IU/mL    Comment: (NOTE) If heparin results are below expected values, and patient dosage has  been confirmed, suggest follow up testing of antithrombin III levels. Performed at [facility], [address] [place], [place] [address]   Basic metabolic panel     Status: Abnormal   Collection Time: 12/08/19 12:35 AM  Result Value Ref Range   Sodium 134 (L) 135 - 145 mmol/L   Potassium 4.4 3.5 - 5.1 mmol/L   Chloride 98 98 - 111 mmol/L   CO2 26 22 - 32 mmol/L   Glucose, Bld 105 (H) 70 - 99 mg/dL    Comment: Glucose reference range applies only to samples taken after fasting for at least 8 hours.   BUN 18 6 - 20 mg/dL   Creatinine, Ser 4.78  0.61 - 1.24 mg/dL   Calcium 8.9 8.9 - 39.7 mg/dL   GFR calc non Af Amer >60 >60 mL/min   GFR calc Af Amer >60 >60 mL/min   Anion gap 10 5 - 15    Comment: Performed at [facility], [address] [place], [place] [address]  Magnesium     Status: None   Collection Time: 12/08/19 12:35 AM  Result Value Ref Range   Magnesium 2.3 1.7 - 2.4 mg/dL    Comment: Performed at [facility], [address] [place], [place] [address]  Heparin level (unfractionated)     Status: Abnormal   Collection Time: 12/08/19 12:35 AM  Result Value Ref Range   Heparin  Unfractionated 0.83 (H) 0.30 - 0.70 IU/mL    Comment: (NOTE) If heparin results are below expected values, and patient dosage has  been confirmed, suggest follow up testing of antithrombin III levels. Performed at [facility], [address] [place], [place] [address]   Heparin level (unfractionated)     Status: Abnormal   Collection Time: 12/08/19  8:32 AM  Result Value Ref Range   Heparin Unfractionated 0.84 (H) 0.30 - 0.70 IU/mL    Comment: (NOTE) If heparin results are below expected values, and patient dosage has  been confirmed, suggest follow up testing of antithrombin III levels. Performed at [facility], [address] [place], [place] [address]   CBC     Status: Abnormal   Collection Time: 12/09/19  5:00 AM  Result Value Ref Range   WBC 6.7 4.0 - 10.5 K/uL   RBC 4.56 4.22 - 5.81 MIL/uL   Hemoglobin 9.8 (L) 13.0 - 17.0 g/dL   HCT 92.4 (L) 39 - 52 %   MCV 68.2 (L) 80.0 - 100.0 fL   MCH 21.5 (L) 26.0 - 34.0 pg   MCHC 31.5 30.0 - 36.0 g/dL   RDW 26.8 (H) 34.1 - 96.2 %   Platelets 310 150 - 400 K/uL   nRBC 0.0 0.0 - 0.2 %    Comment: Performed at [facility], [address] [place], [place] [address]   CT TIBIA FIBULA LEFT W CONTRAST  Result Date: 12/07/2019 CLINICAL DATA:  Bilateral lower extremity edema, question abscess EXAM: CT OF THE LOWER RIGHT EXTREMITY WITH CONTRAST TECHNIQUE: Multidetector CT imaging of the lower right extremity was performed according to the standard protocol following intravenous contrast administration. COMPARISON:  None. CONTRAST:  OMNIPAQUE IOHEXOL 300 MG/ML  SOLN FINDINGS: Bones/Joint/Cartilage No fracture or dislocation. Mild femoroacetabular and medial compartment osteoarthritis with joint space loss and marginal osteophyte formation. There is enthesophytes seen at the medial femoral condyle, likely from prior ligamentous injury. Ligaments Suboptimally assessed by CT. Muscles and Tendons The muscles  surrounding the lower extremity intact without focal atrophy or tear. The tendons surrounding the lower extremity appear to be intact. Soft tissues There is diffuse skin thickening and subcutaneous edema seen surrounding the extremity. Loculated seen the anterolateral aspect of the distal femur and knee. There is also diffuse subcutaneous edema seen around the ankle with areas of induration the medial portion the ankle. Along the medial portion the ankle there is a focal area possible ulceration and a loculated collection measuring 1.7 x 0.7 x 5.2 cm and extends along the anterior distal tibia. There is small soft tissue calcifications seen along the anterior tibia. There is also ossification along the posterior proximal fibula likely from prior injury. IMPRESSION: 1. Findings suggestive of diffuse cellulitis  involving the lower extremity with subcutaneous edema/phlegmon surrounding the distal femur and knee. 2. There is also induration and a probable area of ulceration seen along the medial aspect of the ankle with a small loculated fluid collection measuring 1.7 x 0.7 x 5.2 cm as described above. 3. Scattered areas of soft tissue calcifications. Electronically Signed   By: Jonna ClarkBindu  Avutu M.D.   On: 12/07/2019 20:29   CT FEMUR LEFT W CONTRAST  Result Date: 12/07/2019 CLINICAL DATA:  Bilateral lower extremity edema, question abscess EXAM: CT OF THE LOWER RIGHT EXTREMITY WITH CONTRAST TECHNIQUE: Multidetector CT imaging of the lower right extremity was performed according to the standard protocol following intravenous contrast administration. COMPARISON:  None. CONTRAST:  125mL OMNIPAQUE IOHEXOL 300 MG/ML  SOLN FINDINGS: Bones/Joint/Cartilage No fracture or dislocation. Mild femoroacetabular and medial compartment osteoarthritis with joint space loss and marginal osteophyte formation. There is enthesophytes seen at the medial femoral condyle, likely from prior ligamentous injury. Ligaments Suboptimally assessed by  CT. Muscles and Tendons The muscles surrounding the lower extremity intact without focal atrophy or tear. The tendons surrounding the lower extremity appear to be intact. Soft tissues There is diffuse skin thickening and subcutaneous edema seen surrounding the extremity. Loculated seen the anterolateral aspect of the distal femur and knee. There is also diffuse subcutaneous edema seen around the ankle with areas of induration the medial portion the ankle. Along the medial portion the ankle there is a focal area possible ulceration and a loculated collection measuring 1.7 x 0.7 x 5.2 cm and extends along the anterior distal tibia. There is small soft tissue calcifications seen along the anterior tibia. There is also ossification along the posterior proximal fibula likely from prior injury. IMPRESSION: 1. Findings suggestive of diffuse cellulitis involving the lower extremity with subcutaneous edema/phlegmon surrounding the distal femur and knee. 2. There is also induration and a probable area of ulceration seen along the medial aspect of the ankle with a small loculated fluid collection measuring 1.7 x 0.7 x 5.2 cm as described above. 3. Scattered areas of soft tissue calcifications. Electronically Signed   By: Jonna ClarkBindu  Avutu M.D.   On: 12/07/2019 20:29   CT FOOT LEFT W CONTRAST  Result Date: 12/07/2019 CLINICAL DATA:  Bilateral lower extremity edema, question abscess EXAM: CT OF THE LOWER RIGHT EXTREMITY WITH CONTRAST TECHNIQUE: Multidetector CT imaging of the lower right extremity was performed according to the standard protocol following intravenous contrast administration. COMPARISON:  None. CONTRAST:  125mL OMNIPAQUE IOHEXOL 300 MG/ML  SOLN FINDINGS: Bones/Joint/Cartilage No fracture or dislocation. Mild femoroacetabular and medial compartment osteoarthritis with joint space loss and marginal osteophyte formation. There is enthesophytes seen at the medial femoral condyle, likely from prior ligamentous injury.  Ligaments Suboptimally assessed by CT. Muscles and Tendons The muscles surrounding the lower extremity intact without focal atrophy or tear. The tendons surrounding the lower extremity appear to be intact. Soft tissues There is diffuse skin thickening and subcutaneous edema seen surrounding the extremity. Loculated seen the anterolateral aspect of the distal femur and knee. There is also diffuse subcutaneous edema seen around the ankle with areas of induration the medial portion the ankle. Along the medial portion the ankle there is a focal area possible ulceration and a loculated collection measuring 1.7 x 0.7 x 5.2 cm and extends along the anterior distal tibia. There is small soft tissue calcifications seen along the anterior tibia. There is also ossification along the posterior proximal fibula likely from prior injury. IMPRESSION: 1. Findings suggestive of  diffuse cellulitis involving the lower extremity with subcutaneous edema/phlegmon surrounding the distal femur and knee. 2. There is also induration and a probable area of ulceration seen along the medial aspect of the ankle with a small loculated fluid collection measuring 1.7 x 0.7 x 5.2 cm as described above. 3. Scattered areas of soft tissue calcifications. Electronically Signed   By: Jonna Clark M.D.   On: 12/07/2019 20:29   MR KNEE LEFT WO CONTRAST  Result Date: 12/09/2019 CLINICAL DATA:  Leg cellulitis, query septic arthritis EXAM: MRI OF THE LEFT KNEE WITHOUT CONTRAST TECHNIQUE: Multiplanar, multisequence MR imaging of the knee was performed. No intravenous contrast was administered. COMPARISON:  Left femur and tibia/fibula CT examinations from 12/07/2019 FINDINGS: Despite efforts by the technologist and patient, motion artifact is present on today's exam and could not be eliminated. This reduces exam sensitivity and specificity. MENISCI Medial meniscus: Free edge truncation of the midbody on image 17/11 compatible with radial tear. Diminutive  adjacent portions of the posterior and anterior horn with indistinctness of significant segments of the posterior horn especially near the meniscal root likely reflecting additional degenerative tearing. Lateral meniscus: Meniscal flounce in the posterior horn as shown on image 14/11 with suspected free edge tear on images 32 through 33 of series 12 in the midbody. LIGAMENTS Cruciates: Accentuated signal in the anterior cruciate ligament suggesting degeneration but without overt tear identified. PCL intact. Collaterals: Small focal tear in the proximal medial collateral ligament, image 11/9. Although torn the proximal MCL is not detached. The fibular collateral ligament appears intact. CARTILAGE Patellofemoral:  Minimal marginal spurring. Medial:  Mild degenerative chondral thinning.  Marginal spurring. Lateral:  Mild degenerative chondral thinning.  Marginal spurring. Joint: Small joint effusion. Equivocal synovitis, less than I would expect if the joint were infected. Field heterogeneity along the upper portion of the joint effusion. Popliteal Fossa: Edema tracks along the superficial fascia margin of the posterior compartmental musculature of the calf (along the gastrocnemius muscles) for example on image 34/9. Extensor Mechanism: Distal quadriceps tendon obscured by field heterogeneity. Medial patellofemoral ligament difficult to assess due to motion artifact but probably intact. There is some subcortical marrow edema along the medial femoral epicondyle in the vicinity of the attachment of the medial patellofemoral ligament which could be a secondary sign of injury but could also simply be from low-grade bone bruising. Bones: No additional bony findings compared to those discussed above. Other: Subcutaneous edema diffusely in the upper calf, and primarily posteriorly and medially in the lower thigh. Cellulitis not excluded. IMPRESSION: 1. Subcutaneous edema diffusely in the upper calf, and primarily posteriorly  and medially in the lower thigh. Cellulitis not excluded. 2. Small joint effusion extending into the suprapatellar bursa. In this setting, it is difficult to exclude the possibility of septic joint by imaging, but there is less synovitis than is typically encountered in the setting of septic joint. 3. Free edge truncation of the midbody medial meniscus compatible with radial tear. Diminutive adjacent portions of the posterior and anterior horn with indistinctness of significant segments of the posterior horn especially near the meniscal root likely reflecting additional degenerative tearing. 4. Meniscal flounce in the posterior horn lateral meniscus with suspected free edge tear in the midbody. 5. Small focal tear in the proximal medial collateral ligament. Although torn, the proximal MCL is not detached. 6. Accentuated signal in the anterior cruciate ligament suggesting degeneration but without overt tear identified. 7. edema tracks along the superficial fascia margin of the posterior compartmental musculature of  the calf (along the gastrocnemius muscles) for example on image 34/9. 8. There is some subcortical marrow edema along the medial femoral epicondyle in the vicinity of the attachment of the medial patellofemoral ligament, which could be a secondary sign of injury but could also simply be from low-grade bone bruising. 9. Despite efforts by the technologist and patient, motion artifact is present on today's exam and could not be eliminated. This reduces exam sensitivity and specificity. Electronically Signed   By: Gaylyn Rong M.D.   On: 12/09/2019 08:11    Blood pressure 121/70, pulse (!) 59, temperature 98.5 F (36.9 C), temperature source Oral, resp. rate 18, height  (2.007 m), weight (!) 170 kg, SpO2 98 %.  Assessment 1. Bacteremia secondary to potential left lower extremity wounds/abscess 2. Chronic venous insufficiency 3. Lymphedema bilateral lower  extremities 4. CHF  Plan -Patient seen and examined. -Awaiting MRI still the left lower extremity. -Continue compression wraps daily.  Wound care consult still placed for Unna boot placement to bilateral lower extremities. -Dressings left clean, dry, and intact today. -We will reevaluate again after MRIs are performed to discuss further treatment options.  Likely that patient is suffering from bacteremia due to extreme cellulitis to left lower extremity but will check and see if further abscesses present after MRI is performed.  Clinically does not appear to have any abscess or fluctuance to the tissues from the ankle to distal. -Appreciate ID recommendations.  Rosetta Posner, DPM 12/09/2019, 1:04 PM

## 2019-12-09 NOTE — Progress Notes (Signed)
Progress Note  Patient Name: Alfred [name] Date of Encounter: 12/09/2019  [facility] Cardiologist: Lorine BearsMuhammad Arida, MD   Subjective   Patient put out -3.5L urine overnight, weight is down  4lbs since yesterday. He denies chest pain. Breathing stable. Remains in sinus with rates in the 60s. Plan for MRI L leg today.   Inpatient Medications    Scheduled Meds: . amiodarone  200 mg Oral BID  . [START ON 12/13/2019] amiodarone  200 mg Oral Daily  . apixaban  5 mg Oral BID  . Chlorhexidine Gluconate Cloth  6 each Topical Daily  . ciprofloxacin  500 mg Oral BID  . diclofenac Sodium  2 g Topical QID  . furosemide  60 mg Intravenous BID  . metoprolol tartrate  50 mg Oral BID  . polyethylene glycol  17 g Oral Daily  . potassium chloride  20 mEq Oral Daily  . senna  2 tablet Oral QHS  . sodium chloride flush  10-40 mL Intracatheter Q12H   Continuous Infusions: . sodium chloride 10 mL (12/09/19 0006)  . ampicillin-sulbactam (UNASYN) IV 3 g (12/09/19 0533)  . sodium chloride     PRN Meds: sodium chloride, acetaminophen **OR** acetaminophen, albuterol, bisacodyl, gadobutrol, guaiFENesin, HYDROcodone-acetaminophen, ipratropium-albuterol, melatonin, morphine injection, ondansetron **OR** ondansetron (ZOFRAN) IV, sodium chloride flush   Vital Signs    Vitals:   12/08/19 1153 12/08/19 1442 12/08/19 1936 12/09/19 0505  BP: (!) 100/52 (!) 109/58 125/66 (!) 108/58  Pulse: (!) 57 62 69 60  Resp: 18 18 18 20   Temp: 97.9 F (36.6 C) 97.8 F (36.6 C) 98.5 F (36.9 C) 98.4 F (36.9 C)  TempSrc: Oral Oral Oral Oral  SpO2: 98% 95% 97% 98%  Weight:    (!) 170 kg  Height:        Intake/Output Summary (Last 24 hours) at 12/09/2019 0811 Last data filed at 12/09/2019 0549 Gross per 24 hour  Intake 1300 ml  Output 3150 ml  Net -1850 ml   Last 3 Weights 12/09/2019 12/08/2019 12/05/2019  Weight (lbs) 374 lb 11.2 oz 378 lb 8.5 oz 396 lb 13.3 oz  Weight (kg) 169.963 kg 171.7 kg 180 kg       Telemetry    NSR, HR 60s, bradycardia overnight into the 50s - Personally Reviewed  ECG    No new - Personally Reviewed  Physical Exam   GEN: No acute distress.   Neck: difficult to assess JVD Cardiac: RRR, no murmurs, rubs, or gallops.  Respiratory: wheezing, diminished breath sounds GI: Soft, nontender, non-distended  MS: mild edema; No deformity. Neuro:  Nonfocal  Psych: Normal affect   Labs    High Sensitivity Troponin:   Recent Labs  Lab 11/26/19 1811 11/26/19 2000  TROPONINIHS 27* 27*      Chemistry Recent Labs  Lab 12/06/19 0500 12/07/19 0614 12/08/19 0035  NA 132* 134* 134*  K 4.2 4.4 4.4  CL 95* 96* 98  CO2 28 28 26   GLUCOSE 115* 104* 105*  BUN 18 17 18   CREATININE 1.01 1.06 1.08  CALCIUM 8.6* 8.7* 8.9  GFRNONAA >60 >60 >60  GFRAA >60 >60 >60  ANIONGAP 9 10 10      Hematology Recent Labs  Lab 12/06/19 0500 12/07/19 0614 12/09/19 0500  WBC 8.8 7.4 6.7  RBC 4.28 4.35 4.56  HGB 9.1* 9.4* 9.8*  HCT 29.2* 30.7* 31.1*  MCV 68.2* 70.6* 68.2*  MCH 21.3* 21.6* 21.5*  MCHC 31.2 30.6 31.5  RDW 16.8* 17.3* 17.2*  PLT 278 312 310    BNPNo results for input(s): BNP, PROBNP in the last 168 hours.   DDimer No results for input(s): DDIMER in the last 168 hours.   Radiology    CT TIBIA FIBULA LEFT W CONTRAST  Result Date: 12/07/2019 CLINICAL DATA:  Bilateral lower extremity edema, question abscess EXAM: CT OF THE LOWER RIGHT EXTREMITY WITH CONTRAST TECHNIQUE: Multidetector CT imaging of the lower right extremity was performed according to the standard protocol following intravenous contrast administration. COMPARISON:  None. CONTRAST:  OMNIPAQUE IOHEXOL 300 MG/ML  SOLN FINDINGS: Bones/Joint/Cartilage No fracture or dislocation. Mild femoroacetabular and medial compartment osteoarthritis with joint space loss and marginal osteophyte formation. There is enthesophytes seen at the medial femoral condyle, likely from prior ligamentous injury.  Ligaments Suboptimally assessed by CT. Muscles and Tendons The muscles surrounding the lower extremity intact without focal atrophy or tear. The tendons surrounding the lower extremity appear to be intact. Soft tissues There is diffuse skin thickening and subcutaneous edema seen surrounding the extremity. Loculated seen the anterolateral aspect of the distal femur and knee. There is also diffuse subcutaneous edema seen around the ankle with areas of induration the medial portion the ankle. Along the medial portion the ankle there is a focal area possible ulceration and a loculated collection measuring 1.7 x 0.7 x 5.2 cm and extends along the anterior distal tibia. There is small soft tissue calcifications seen along the anterior tibia. There is also ossification along the posterior proximal fibula likely from prior injury. IMPRESSION: 1. Findings suggestive of diffuse cellulitis involving the lower extremity with subcutaneous edema/phlegmon surrounding the distal femur and knee. 2. There is also induration and a probable area of ulceration seen along the medial aspect of the ankle with a small loculated fluid collection measuring 1.7 x 0.7 x 5.2 cm as described above. 3. Scattered areas of soft tissue calcifications. Electronically Signed   By: Jonna Clark M.D.   On: 12/07/2019 20:29   CT FEMUR LEFT W CONTRAST  Result Date: 12/07/2019 CLINICAL DATA:  Bilateral lower extremity edema, question abscess EXAM: CT OF THE LOWER RIGHT EXTREMITY WITH CONTRAST TECHNIQUE: Multidetector CT imaging of the lower right extremity was performed according to the standard protocol following intravenous contrast administration. COMPARISON:  None. CONTRAST:  OMNIPAQUE IOHEXOL 300 MG/ML  SOLN FINDINGS: Bones/Joint/Cartilage No fracture or dislocation. Mild femoroacetabular and medial compartment osteoarthritis with joint space loss and marginal osteophyte formation. There is enthesophytes seen at the medial femoral condyle,  likely from prior ligamentous injury. Ligaments Suboptimally assessed by CT. Muscles and Tendons The muscles surrounding the lower extremity intact without focal atrophy or tear. The tendons surrounding the lower extremity appear to be intact. Soft tissues There is diffuse skin thickening and subcutaneous edema seen surrounding the extremity. Loculated seen the anterolateral aspect of the distal femur and knee. There is also diffuse subcutaneous edema seen around the ankle with areas of induration the medial portion the ankle. Along the medial portion the ankle there is a focal area possible ulceration and a loculated collection measuring 1.7 x 0.7 x 5.2 cm and extends along the anterior distal tibia. There is small soft tissue calcifications seen along the anterior tibia. There is also ossification along the posterior proximal fibula likely from prior injury. IMPRESSION: 1. Findings suggestive of diffuse cellulitis involving the lower extremity with subcutaneous edema/phlegmon surrounding the distal femur and knee. 2. There is also induration and a probable area of ulceration seen along the medial aspect of the  ankle with a small loculated fluid collection measuring 1.7 x 0.7 x 5.2 cm as described above. 3. Scattered areas of soft tissue calcifications. Electronically Signed   By: Jonna Clark M.D.   On: 12/07/2019 20:29   CT FOOT LEFT W CONTRAST  Result Date: 12/07/2019 CLINICAL DATA:  Bilateral lower extremity edema, question abscess EXAM: CT OF THE LOWER RIGHT EXTREMITY WITH CONTRAST TECHNIQUE: Multidetector CT imaging of the lower right extremity was performed according to the standard protocol following intravenous contrast administration. COMPARISON:  None. CONTRAST:  OMNIPAQUE IOHEXOL 300 MG/ML  SOLN FINDINGS: Bones/Joint/Cartilage No fracture or dislocation. Mild femoroacetabular and medial compartment osteoarthritis with joint space loss and marginal osteophyte formation. There is enthesophytes  seen at the medial femoral condyle, likely from prior ligamentous injury. Ligaments Suboptimally assessed by CT. Muscles and Tendons The muscles surrounding the lower extremity intact without focal atrophy or tear. The tendons surrounding the lower extremity appear to be intact. Soft tissues There is diffuse skin thickening and subcutaneous edema seen surrounding the extremity. Loculated seen the anterolateral aspect of the distal femur and knee. There is also diffuse subcutaneous edema seen around the ankle with areas of induration the medial portion the ankle. Along the medial portion the ankle there is a focal area possible ulceration and a loculated collection measuring 1.7 x 0.7 x 5.2 cm and extends along the anterior distal tibia. There is small soft tissue calcifications seen along the anterior tibia. There is also ossification along the posterior proximal fibula likely from prior injury. IMPRESSION: 1. Findings suggestive of diffuse cellulitis involving the lower extremity with subcutaneous edema/phlegmon surrounding the distal femur and knee. 2. There is also induration and a probable area of ulceration seen along the medial aspect of the ankle with a small loculated fluid collection measuring 1.7 x 0.7 x 5.2 cm as described above. 3. Scattered areas of soft tissue calcifications. Electronically Signed   By: Jonna Clark M.D.   On: 12/07/2019 20:29   ECHO TEE  Result Date: 12/07/2019    TRANSESOPHOGEAL ECHO REPORT   Patient Name:   ELVIN MCCARTIN Barner Date of Exam: 12/07/2019 Medical Rec #:  [id number]            Height:       79.0 in Accession #:    [id number]           Weight:       396.8 lb Date of Birth:  1965/09/20            BSA:          3.049 m Patient Age:    54 years             BP:           114/60 mmHg Patient Gender: M                    HR:           62 bpm. Exam Location:  [facility] Procedure: Transesophageal Echo, Color Doppler, Cardiac Doppler and Saline            Contrast Bubble Study  Indications:     R78.81 Bacteremia  History:         Patient has prior history of Echocardiogram examinations, most                  recent 11/27/2019.  Sonographer:     Humphrey Rolls RDCS (AE) Referring Phys:  [id number] Sondra Barges  Diagnosing Phys: Julien Nordmann MD PROCEDURE: After discussion of the risks and benefits of a TEE, an informed consent was obtained from the patient. TEE procedure time was 40 minutes. The transesophogeal probe was passed without difficulty through the esophogus of the patient. Imaged were obtained with the patient in a left lateral decubitus position. Local oropharyngeal anesthetic was provided with Cetacaine and Benzocaine spray. Sedation performed by performing physician. Image quality was excellent. The patient's vital signs; including heart rate, blood pressure, and oxygen saturation; remained stable throughout the procedure. The patient developed no complications during the procedure. IMPRESSIONS  1. Left ventricular ejection fraction, by estimation, is 55 . The left ventricle has normal function. The left ventricle has no regional wall motion abnormalities.  2. Right ventricular systolic function is normal. The right ventricular size is normal.  3. Left atrial size was mildly dilated. No left atrial/left atrial appendage thrombus was detected.  4. No valve vegetation noted.  5. Picc line catheter noted in the right atrium. Conclusion(s)/Recommendation(s): Normal biventricular function without evidence of hemodynamically significant valvular heart disease. FINDINGS  Left Ventricle: Left ventricular ejection fraction, by estimation, is 55 to 60%. The left ventricle has normal function. The left ventricle has no regional wall motion abnormalities. The left ventricular internal cavity size was normal in size. There is  no left ventricular hypertrophy. Right Ventricle: The right ventricular size is normal. No increase in right ventricular wall thickness. Right ventricular systolic function is  normal. Left Atrium: Left atrial size was mildly dilated. No left atrial/left atrial appendage thrombus was detected. Right Atrium: Right atrial size was normal in size. Pericardium: There is no evidence of pericardial effusion. Mitral Valve: The mitral valve is normal in structure. Normal mobility of the mitral valve leaflets. Trivial mitral valve regurgitation. No evidence of mitral valve stenosis. Tricuspid Valve: The tricuspid valve is normal in structure. Tricuspid valve regurgitation is not demonstrated. No evidence of tricuspid stenosis. Aortic Valve: The aortic valve is normal in structure. Aortic valve regurgitation is not visualized. No aortic stenosis is present. Pulmonic Valve: The pulmonic valve was normal in structure. Pulmonic valve regurgitation is not visualized. No evidence of pulmonic stenosis. Aorta: The aortic root is normal in size and structure. Venous: The inferior vena cava is normal in size with greater than 50% respiratory variability, suggesting right atrial pressure of 3 mmHg. IAS/Shunts: No atrial level shunt detected by color flow Doppler. Agitated saline contrast was given intravenously to evaluate for intracardiac shunting. No ventricular septal defect is seen or detected. There is no evidence of an atrial septal defect. Julien Nordmann MD Electronically signed by Julien Nordmann MD Signature Date/Time: 12/07/2019/1:52:09 PM    Final     Cardiac Studies   Echo TEE 12/07/19 1. Left ventricular ejection fraction, by estimation, is 55 . The left  ventricle has normal function. The left ventricle has no regional wall  motion abnormalities.  2. Right ventricular systolic function is normal. The right ventricular  size is normal.  3. Left atrial size was mildly dilated. No left atrial/left atrial  appendage thrombus was detected.  4. No valve vegetation noted.  5. Picc line catheter noted in the right atrium.   Echo 11/27/19 1. Left ventricular ejection fraction, by  estimation, is 50 to 55%. The  left ventricle has low normal function. Left ventricular endocardial  border not optimally defined to evaluate regional wall motion. The left  ventricular internal cavity size was  mildly dilated. There is moderate left ventricular hypertrophy. Left  ventricular diastolic function could not be evaluated.  2. Right ventricular systolic function is normal. The right ventricular  size is normal. Tricuspid regurgitation signal is inadequate for assessing  PA pressure.  3. Left atrial size was mildly dilated.  4. The mitral valve is normal in structure. No evidence of mitral valve  regurgitation. No evidence of mitral stenosis.  5. The aortic valve is normal in structure. Aortic valve regurgitation is  not visualized. No aortic stenosis is present.  6. Very challenging study with limited views and significant tachycardia  during the exam.   Patient Profile     54 y.o. male with pmh of morbid obesity, lower extremity edema, tobacco use admitted 8/20 with worsening LLE, acute on chronic HFpEF, groub B streptococcus bacteremia and subsequently developed atrial flutter on 8/21 with spontaneous conversion to sinus 8/24.   Assessment & Plan    Aflutter - Developed on 8/21 with spontaneous conversion to sinus 8/24 - On amiodarone 200mg  BID x1 week and then 200 mg daily thereafter.  - Lopressor 50 mg BID - IV heparin transitioned to Eliquis 5mg  BID with CADSVASC at least 2 (CHF, PVD) - He remains in sinus  Acute on chronic HFpEF - IV lasix 60mg  BID - Urine output overnight 3.1L, total net -33.7L - weights continue to trend down, 374lbs today - creatinine stable - difficult to assess fluid status due to body habitus. Appears to have some mild LLE still. Would continue with diuresis today. Plan to switch to torsemide eventually, possibly tomorrow.   Streptococcus group B bacteremia - abx per ID - TEE showed no endocarditis/valve vegetation  Left leg  pain - per ortho/podiatry/IM - MRI today   For questions or updates, please contact [facility] Please consult [email or web address] for contact info under        Signed, Bluford Sedler , PA-C  12/09/2019, 8:11 AM

## 2019-12-09 NOTE — Progress Notes (Signed)
Progress Note    Alfred [name]  ZOX:[id number] DOB: [birth year]  DOA: 11/24/2019 PCP: Patient, No Pcp Per      Brief Narrative:    Medical records reviewed and are as summarized below:  Alfred [name] is a 54 y.o. male       Assessment/Plan:   Principal Problem:   Streptococcal sepsis, unspecified (HCC) Active Problems:   Cellulitis of left leg   AKI (acute kidney injury) (HCC)   Bacteremia   Lower extremity edema   Morbid obesity (HCC)   Tobacco abuse   Paroxysmal atrial fibrillation (HCC)   Acute on chronic heart failure with preserved ejection fraction (HFpEF) (HCC)   Atrial flutter (HCC)   Heart failure with preserved ejection fraction (HCC)   Severe sepsis and group B strep bacteremia, left lower extremity cellulitis, left leg wound (Wound culture from left leg on 11/27/2019 showed Pseudomonas aeruginosa)  Atrial flutter with RVR (CHaDs2VASC score of 1)-converted to NSR  Significantly elevated D-dimer (1,759)-CT venogram, venous duplex and CTA did not show any evidence of venous thromboembolism.  Acute on chronic diastolic CHF  Hypokalemia-improved  Chronic venous insufficiency/venous stasis  Hepatitis C-outpatient follow-up for further management  Tobacco use disorder  AKI-resolved  Body mass index is 42.21 kg/m.  (Morbid obesity): This complicates overall care and prognosis   PLAN  S/p TEE on 12/07/2019.  There was no evidence of vegetations. Continue Eliquis IV Lasix has been discontinued and he has been started on oral torsemide MRI left knee did not show any evidence of fluid collection that requires drainage per orthopedic surgeon.  Chronic findings on MRI left knee can be evaluated in the outpatient setting. Continue IV antibiotics. Continue oral amiodarone for atrial flutter.   Diet Order            Diet heart healthy/carb modified Room service appropriate? Yes; Fluid consistency: Thin  Diet effective now                     Consultants:  Cardiologist  Infectious disease  Procedures:  TEE on 12/07/2019    Medications:   . amiodarone  200 mg Oral BID  . [START ON 12/13/2019] amiodarone  200 mg Oral Daily  . apixaban  5 mg Oral BID  . Chlorhexidine Gluconate Cloth  6 each Topical Daily  . ciprofloxacin  500 mg Oral BID  . diclofenac Sodium  2 g Topical QID  . metoprolol tartrate  50 mg Oral BID  . polyethylene glycol  17 g Oral Daily  . potassium chloride  20 mEq Oral Daily  . senna  2 tablet Oral QHS  . sodium chloride flush  10-40 mL Intracatheter Q12H  . torsemide  40 mg Oral BID   Continuous Infusions: . sodium chloride 10 mL (12/09/19 0006)  . ampicillin-sulbactam (UNASYN) IV 3 g (12/09/19 1244)  . sodium chloride       Anti-infectives (From admission, onward)   Start     Dose/Rate Route Frequency Ordered Stop   12/09/19 0800  ciprofloxacin (CIPRO) tablet 500 mg        500 mg Oral 2 times daily 12/08/19 1743     12/05/19 0000  Ampicillin-Sulbactam (UNASYN) 3 g in sodium chloride 0.9 % 100 mL IVPB        3 g 200 mL/hr over 30 Minutes Intravenous Every 6 hours 12/04/19 1622     11/30/19 2000  ciprofloxacin (CIPRO) IVPB 400 mg  Status:  Discontinued  400 mg 200 mL/hr over 60 Minutes Intravenous Every 12 hours 11/30/19 1715 12/08/19 1743   11/30/19 1800  ceFAZolin (ANCEF) IVPB 2g/100 mL premix  Status:  Discontinued        2 g 200 mL/hr over 30 Minutes Intravenous Every 8 hours 11/30/19 1715 12/04/19 1622   11/30/19 1800  clindamycin (CLEOCIN) IVPB 900 mg  Status:  Discontinued        900 mg 100 mL/hr over 30 Minutes Intravenous Every 8 hours 11/30/19 1715 12/04/19 1622   11/29/19 0600  Ampicillin-Sulbactam (UNASYN) 3 g in sodium chloride 0.9 % 100 mL IVPB  Status:  Discontinued        3 g 200 mL/hr over 30 Minutes Intravenous Every 6 hours 11/28/19 0918 11/30/19 1538   11/25/19 0800  cefTRIAXone (ROCEPHIN) 2 g in sodium chloride 0.9 % 100 mL IVPB  Status:   Discontinued        2 g 200 mL/hr over 30 Minutes Intravenous Every 24 hours 11/25/19 0544 11/28/19 0918   11/25/19 0600  vancomycin (VANCOREADY) IVPB 750 mg/150 mL  Status:  Discontinued        750 mg 150 mL/hr over 60 Minutes Intravenous Every 12 hours 11/24/19 2029 11/27/19 1525   11/24/19 2200  piperacillin-tazobactam (ZOSYN) IVPB 3.375 g  Status:  Discontinued        3.375 g 12.5 mL/hr over 240 Minutes Intravenous Every 8 hours 11/24/19 2026 11/25/19 0543   11/24/19 2015  piperacillin-tazobactam (ZOSYN) IVPB 3.375 g  Status:  Discontinued        3.375 g 100 mL/hr over 30 Minutes Intravenous  Once 11/24/19 2004 11/24/19 2026   11/24/19 2015  vancomycin (VANCOCIN) IVPB 1000 mg/200 mL premix  Status:  Discontinued        1,000 mg 200 mL/hr over 60 Minutes Intravenous  Once 11/24/19 2004 11/24/19 2025   11/24/19 1845  vancomycin (VANCOREADY) IVPB 2000 mg/400 mL        2,000 mg 200 mL/hr over 120 Minutes Intravenous  Once 11/24/19 1748 11/25/19 0130   11/24/19 1745  ceFEPIme (MAXIPIME) 2 g in sodium chloride 0.9 % 100 mL IVPB  Status:  Discontinued        2 g 200 mL/hr over 30 Minutes Intravenous Every 8 hours 11/24/19 1732 11/24/19 2025             Family Communication/Anticipated D/C date and plan/Code Status   DVT prophylaxis: IV heparin     Code Status: Full Code  Family Communication: Discussed with patient Disposition Plan:    Status is: Inpatient  Remains inpatient appropriate because:Ongoing diagnostic testing needed not appropriate for outpatient work up and Inpatient level of care appropriate due to severity of illness   Dispo: The patient is from: Home              Anticipated d/c is to: Home              Anticipated d/c date is: 3 days              Patient currently is not medically stable to d/c.           Subjective:   He complains of pain in the left leg and foot.  No chest pain or shortness of breath  Objective:    Vitals:    12/08/19 1936 12/09/19 0505 12/09/19 0920 12/09/19 1251  BP: 125/66 (!) 108/58 138/69 121/70  Pulse: 69 60 62 (!) 59  Resp: 18 20 18  18  Temp: 98.5 F (36.9 C) 98.4 F (36.9 C) 98 F (36.7 C) 98.5 F (36.9 C)  TempSrc: Oral Oral Oral Oral  SpO2: 97% 98% 100% 98%  Weight:  (!) 170 kg    Height:       No data found.   Intake/Output Summary (Last 24 hours) at 12/09/2019 1615 Last data filed at 12/09/2019 1216 Gross per 24 hour  Intake 820 ml  Output 2900 ml  Net -2080 ml   Filed Weights   12/05/19 0424 12/08/19 0410 12/09/19 0505  Weight: (!) 180 kg (!) 171.7 kg (!) 170 kg    Exam:  GEN: NAD SKIN: Warm and dry EYES: No pallor or icterus ENT: MMM CV: RRR PULM: CTA B ABD: soft, obese, NT, +BS CNS: AAO x 3, non focal EXT: Mild bilateral leg swelling.  Tenderness of the left leg and foot   Data Reviewed:   I have personally reviewed following labs and imaging studies:  Labs: Labs show the following:   Basic Metabolic Panel: Recent Labs  Lab 12/03/19 0614 12/03/19 0614 12/04/19 0435 12/04/19 0435 12/05/19 0407 12/05/19 0407 12/06/19 0500 12/06/19 0500 12/07/19 0614 12/08/19 0035  NA 132*   < > 135  --  135  --  132*  --  134* 134*  K 3.8   < > 4.3   < > 4.4   < > 4.2   < > 4.4 4.4  CL 94*   < > 98  --  96*  --  95*  --  96* 98  CO2 27   < > 30  --  28  --  28  --  28 26  GLUCOSE 118*   < > 114*  --  110*  --  115*  --  104* 105*  BUN 14   < > 17  --  17  --  18  --  17 18  CREATININE 0.90   < > 0.92  --  0.87  --  1.01  --  1.06 1.08  CALCIUM 8.1*   < > 8.3*  --  8.3*  --  8.6*  --  8.7* 8.9  MG 2.1  --   --   --   --   --   --   --   --  2.3   < > = values in this interval not displayed.   GFR Estimated Creatinine Clearance: 139 mL/min (by C-G formula based on SCr of 1.08 mg/dL). Liver Function Tests: No results for input(s): AST, ALT, ALKPHOS, BILITOT, PROT, ALBUMIN in the last 168 hours. No results for input(s): LIPASE, AMYLASE in the last 168  hours. No results for input(s): AMMONIA in the last 168 hours. Coagulation profile No results for input(s): INR, PROTIME in the last 168 hours.  CBC: Recent Labs  Lab 12/03/19 0614 12/03/19 0614 12/04/19 0435 12/05/19 0407 12/06/19 0500 12/07/19 0614 12/09/19 0500  WBC 11.4*   < > 11.8* 11.3* 8.8 7.4 6.7  NEUTROABS 9.0*  --  9.2* 8.7* 6.6  --   --   HGB 9.1*   < > 8.7* 9.5* 9.1* 9.4* 9.8*  HCT 29.0*   < > 28.2* 30.2* 29.2* 30.7* 31.1*  MCV 68.4*   < > 70.0* 67.9* 68.2* 70.6* 68.2*  PLT 245   < > 305 322 278 312 310   < > = values in this interval not displayed.   Cardiac Enzymes: No results for input(s): CKTOTAL, CKMB, CKMBINDEX, TROPONINI  in the last 168 hours. BNP (last 3 results) No results for input(s): PROBNP in the last 8760 hours. CBG: No results for input(s): GLUCAP in the last 168 hours. D-Dimer: No results for input(s): DDIMER in the last 72 hours. Hgb A1c: No results for input(s): HGBA1C in the last 72 hours. Lipid Profile: No results for input(s): CHOL, HDL, LDLCALC, TRIG, CHOLHDL, LDLDIRECT in the last 72 hours. Thyroid function studies: No results for input(s): TSH, T4TOTAL, T3FREE, THYROIDAB in the last 72 hours.  Invalid input(s): FREET3 Anemia work up: No results for input(s): VITAMINB12, FOLATE, FERRITIN, TIBC, IRON, RETICCTPCT in the last 72 hours. Sepsis Labs: Recent Labs  Lab 12/05/19 0407 12/06/19 0500 12/07/19 0614 12/09/19 0500  WBC 11.3* 8.8 7.4 6.7    Microbiology Recent Results (from the past 240 hour(s))  CULTURE, BLOOD (ROUTINE X 2) w Reflex to ID Panel     Status: None   Collection Time: 12/01/19 12:49 AM   Specimen: BLOOD  Result Value Ref Range Status   Specimen Description BLOOD RFOA  Final   Special Requests BOTTLES DRAWN AEROBIC AND ANAEROBIC BCAV  Final   Culture   Final    NO GROWTH 5 DAYS Performed at [facility], [address] [place], [place] [address]    Report Status 12/06/2019 FINAL  Final    CULTURE, BLOOD (ROUTINE X 2) w Reflex to ID Panel     Status: None   Collection Time: 12/01/19  1:05 AM   Specimen: BLOOD  Result Value Ref Range Status   Specimen Description BLOOD LEFT HAND  Final   Special Requests BOTTLES DRAWN AEROBIC AND ANAEROBIC BCAV  Final   Culture   Final    NO GROWTH 5 DAYS Performed at [facility], [address] [place], [place] [address]    Report Status 12/06/2019 FINAL  Final    Procedures and diagnostic studies:  MR KNEE LEFT WO CONTRAST  Result Date: 12/09/2019 CLINICAL DATA:  Leg cellulitis, query septic arthritis EXAM: MRI OF THE LEFT KNEE WITHOUT CONTRAST TECHNIQUE: Multiplanar, multisequence MR imaging of the knee was performed. No intravenous contrast was administered. COMPARISON:  Left femur and tibia/fibula CT examinations from 12/07/2019 FINDINGS: Despite efforts by the technologist and patient, motion artifact is present on today's exam and could not be eliminated. This reduces exam sensitivity and specificity. MENISCI Medial meniscus: Free edge truncation of the midbody on image 17/11 compatible with radial tear. Diminutive adjacent portions of the posterior and anterior horn with indistinctness of significant segments of the posterior horn especially near the meniscal root likely reflecting additional degenerative tearing. Lateral meniscus: Meniscal flounce in the posterior horn as shown on image 14/11 with suspected free edge tear on images 32 through 33 of series 12 in the midbody. LIGAMENTS Cruciates: Accentuated signal in the anterior cruciate ligament suggesting degeneration but without overt tear identified. PCL intact. Collaterals: Small focal tear in the proximal medial collateral ligament, image 11/9. Although torn the proximal MCL is not detached. The fibular collateral ligament appears intact. CARTILAGE Patellofemoral:  Minimal marginal spurring. Medial:  Mild degenerative chondral thinning.  Marginal spurring. Lateral:  Mild  degenerative chondral thinning.  Marginal spurring. Joint: Small joint effusion. Equivocal synovitis, less than I would expect if the joint were infected. Field heterogeneity along the upper portion of the joint effusion. Popliteal Fossa: Edema tracks along the superficial fascia margin of the posterior compartmental musculature of the calf (along the gastrocnemius muscles) for example on image 34/9. Extensor Mechanism: Distal  quadriceps tendon obscured by field heterogeneity. Medial patellofemoral ligament difficult to assess due to motion artifact but probably intact. There is some subcortical marrow edema along the medial femoral epicondyle in the vicinity of the attachment of the medial patellofemoral ligament which could be a secondary sign of injury but could also simply be from low-grade bone bruising. Bones: No additional bony findings compared to those discussed above. Other: Subcutaneous edema diffusely in the upper calf, and primarily posteriorly and medially in the lower thigh. Cellulitis not excluded. IMPRESSION: 1. Subcutaneous edema diffusely in the upper calf, and primarily posteriorly and medially in the lower thigh. Cellulitis not excluded. 2. Small joint effusion extending into the suprapatellar bursa. In this setting, it is difficult to exclude the possibility of septic joint by imaging, but there is less synovitis than is typically encountered in the setting of septic joint. 3. Free edge truncation of the midbody medial meniscus compatible with radial tear. Diminutive adjacent portions of the posterior and anterior horn with indistinctness of significant segments of the posterior horn especially near the meniscal root likely reflecting additional degenerative tearing. 4. Meniscal flounce in the posterior horn lateral meniscus with suspected free edge tear in the midbody. 5. Small focal tear in the proximal medial collateral ligament. Although torn, the proximal MCL is not detached. 6.  Accentuated signal in the anterior cruciate ligament suggesting degeneration but without overt tear identified. 7. edema tracks along the superficial fascia margin of the posterior compartmental musculature of the calf (along the gastrocnemius muscles) for example on image 34/9. 8. There is some subcortical marrow edema along the medial femoral epicondyle in the vicinity of the attachment of the medial patellofemoral ligament, which could be a secondary sign of injury but could also simply be from low-grade bone bruising. 9. Despite efforts by the technologist and patient, motion artifact is present on today's exam and could not be eliminated. This reduces exam sensitivity and specificity. Electronically Signed   By: Gaylyn Rong M.D.   On: 12/09/2019 08:11               LOS: 15 days   Yariah Selvey  [facility]   Pager on [email or web address]. If 7PM-7AM, please contact night-coverage at [email or web address]     12/09/2019, 4:15 PM

## 2019-12-10 NOTE — Plan of Care (Signed)
  Problem: Clinical Measurements: Goal: Will remain free from infection Outcome: Progressing Goal: Cardiovascular complication will be avoided Outcome: Progressing   Problem: Pain Managment: Goal: General experience of comfort will improve Outcome: Progressing   Problem: Safety: Goal: Ability to remain free from injury will improve Outcome: Progressing   

## 2019-12-10 NOTE — Plan of Care (Signed)

## 2019-12-10 NOTE — Progress Notes (Signed)
Progress Note    Alfred [name]  QJJ:[id number] DOB: [birth year]  DOA: 11/24/2019 PCP: Patient, No Pcp Per      Brief Narrative:    Medical records reviewed and are as summarized below:  Alfred [name] is a 54 y.o. male       Assessment/Plan:   Principal Problem:   Streptococcal sepsis, unspecified (HCC) Active Problems:   Cellulitis of left leg   AKI (acute kidney injury) (HCC)   Bacteremia   Lower extremity edema   Morbid obesity (HCC)   Tobacco abuse   Paroxysmal atrial fibrillation (HCC)   Acute on chronic heart failure with preserved ejection fraction (HFpEF) (HCC)   Atrial flutter (HCC)   Heart failure with preserved ejection fraction (HCC)   Severe sepsis and group B strep bacteremia, left lower extremity cellulitis, left leg wound (Wound culture from left leg on 11/27/2019 showed Pseudomonas aeruginosa)  Atrial flutter with RVR (CHaDs2VASC score of 1)-converted to NSR  Significantly elevated D-dimer (1,759)-CT venogram, venous duplex and CTA did not show any evidence of venous thromboembolism.  Acute on chronic diastolic CHF  Hypokalemia-improved  Chronic venous insufficiency/venous stasis  Hepatitis C-outpatient follow-up for further management  Tobacco use disorder  AKI-resolved  Body mass index is 41.31 kg/m.  (Morbid obesity): This complicates overall care and prognosis   PLAN  S/p TEE on 12/07/2019.  There was no evidence of vegetations. Continue amiodarone and Eliquis Metoprolol has been discontinued because of low blood pressure Continue torsemide MRI of the left ankle showed small fluid collection in the medial aspect of the left ankle.  Podiatrist recommended conservative management with antibiotics. MRI left knee did not show any evidence of fluid collection that requires drainage per orthopedic surgeon.  Chronic findings on MRI left knee can be evaluated in the outpatient setting. Continue current  antibiotics    Diet Order            Diet heart healthy/carb modified Room service appropriate? Yes; Fluid consistency: Thin  Diet effective now                    Consultants:  Cardiologist  Infectious disease  Procedures:  TEE on 12/07/2019    Medications:   . amiodarone  200 mg Oral BID  . [START ON 12/13/2019] amiodarone  200 mg Oral Daily  . apixaban  5 mg Oral BID  . Chlorhexidine Gluconate Cloth  6 each Topical Daily  . ciprofloxacin  500 mg Oral BID  . diclofenac Sodium  2 g Topical QID  . polyethylene glycol  17 g Oral Daily  . potassium chloride  20 mEq Oral Daily  . senna  2 tablet Oral QHS  . sodium chloride flush  10-40 mL Intracatheter Q12H  . torsemide  40 mg Oral BID   Continuous Infusions: . sodium chloride 10 mL (12/10/19 0047)  . ampicillin-sulbactam (UNASYN) IV 3 g (12/10/19 1138)  . sodium chloride       Anti-infectives (From admission, onward)   Start     Dose/Rate Route Frequency Ordered Stop   12/09/19 0800  ciprofloxacin (CIPRO) tablet 500 mg        500 mg Oral 2 times daily 12/08/19 1743     12/05/19 0000  Ampicillin-Sulbactam (UNASYN) 3 g in sodium chloride 0.9 % 100 mL IVPB        3 g 200 mL/hr over 30 Minutes Intravenous Every 6 hours 12/04/19 1622     11/30/19 2000  ciprofloxacin (CIPRO) IVPB 400 mg  Status:  Discontinued        400 mg 200 mL/hr over 60 Minutes Intravenous Every 12 hours 11/30/19 1715 12/08/19 1743   11/30/19 1800  ceFAZolin (ANCEF) IVPB 2g/100 mL premix  Status:  Discontinued        2 g 200 mL/hr over 30 Minutes Intravenous Every 8 hours 11/30/19 1715 12/04/19 1622   11/30/19 1800  clindamycin (CLEOCIN) IVPB 900 mg  Status:  Discontinued        900 mg 100 mL/hr over 30 Minutes Intravenous Every 8 hours 11/30/19 1715 12/04/19 1622   11/29/19 0600  Ampicillin-Sulbactam (UNASYN) 3 g in sodium chloride 0.9 % 100 mL IVPB  Status:  Discontinued        3 g 200 mL/hr over 30 Minutes Intravenous Every 6 hours  11/28/19 0918 11/30/19 1538   11/25/19 0800  cefTRIAXone (ROCEPHIN) 2 g in sodium chloride 0.9 % 100 mL IVPB  Status:  Discontinued        2 g 200 mL/hr over 30 Minutes Intravenous Every 24 hours 11/25/19 0544 11/28/19 0918   11/25/19 0600  vancomycin (VANCOREADY) IVPB 750 mg/150 mL  Status:  Discontinued        750 mg 150 mL/hr over 60 Minutes Intravenous Every 12 hours 11/24/19 2029 11/27/19 1525   11/24/19 2200  piperacillin-tazobactam (ZOSYN) IVPB 3.375 g  Status:  Discontinued        3.375 g 12.5 mL/hr over 240 Minutes Intravenous Every 8 hours 11/24/19 2026 11/25/19 0543   11/24/19 2015  piperacillin-tazobactam (ZOSYN) IVPB 3.375 g  Status:  Discontinued        3.375 g 100 mL/hr over 30 Minutes Intravenous  Once 11/24/19 2004 11/24/19 2026   11/24/19 2015  vancomycin (VANCOCIN) IVPB 1000 mg/200 mL premix  Status:  Discontinued        1,000 mg 200 mL/hr over 60 Minutes Intravenous  Once 11/24/19 2004 11/24/19 2025   11/24/19 1845  vancomycin (VANCOREADY) IVPB 2000 mg/400 mL        2,000 mg 200 mL/hr over 120 Minutes Intravenous  Once 11/24/19 1748 11/25/19 0130   11/24/19 1745  ceFEPIme (MAXIPIME) 2 g in sodium chloride 0.9 % 100 mL IVPB  Status:  Discontinued        2 g 200 mL/hr over 30 Minutes Intravenous Every 8 hours 11/24/19 1732 11/24/19 2025             Family Communication/Anticipated D/C date and plan/Code Status   DVT prophylaxis: IV heparin     Code Status: Full Code  Family Communication: Discussed with patient Disposition Plan:    Status is: Inpatient  Remains inpatient appropriate because:Ongoing diagnostic testing needed not appropriate for outpatient work up and Inpatient level of care appropriate due to severity of illness   Dispo: The patient is from: Home              Anticipated d/c is to: Home              Anticipated d/c date is: 3 days              Patient currently is not medically stable to d/c.           Subjective:   No  new complaints.  He still has pain in the left leg left foot  Objective:    Vitals:   12/09/19 2104 12/10/19 0541 12/10/19 1020 12/10/19 1141  BP: 126/73 95/64 140/64 111/62  Pulse: 62 62 63 65  Resp:   18 18  Temp: 97.7 F (36.5 C) 98.2 F (36.8 C) 97.7 F (36.5 C) 97.9 F (36.6 C)  TempSrc: Oral Oral Oral Oral  SpO2: 100% 98% 98% 96%  Weight:  (!) 166.3 kg    Height:       No data found.   Intake/Output Summary (Last 24 hours) at 12/10/2019 1546 Last data filed at 12/10/2019 1330 Gross per 24 hour  Intake 1774.26 ml  Output 4990 ml  Net -3215.74 ml   Filed Weights   12/08/19 0410 12/09/19 0505 12/10/19 0541  Weight: (!) 171.7 kg (!) 170 kg (!) 166.3 kg    Exam:  GEN: NAD, obese SKIN: Warm and dry.  Chronic hyperpigmentation of bilateral legs EYES: No pallor or icterus ENT: MMM CV: RRR PULM: CTA B ABD: soft, obese, NT, +BS CNS: AAO x 3, non focal EXT: Left leg tenderness with mild swelling    Data Reviewed:   I have personally reviewed following labs and imaging studies:  Labs: Labs show the following:   Basic Metabolic Panel: Recent Labs  Lab 12/04/19 0435 12/04/19 0435 12/05/19 0407 12/05/19 0407 12/06/19 0500 12/06/19 0500 12/07/19 0614 12/08/19 0035  NA 135  --  135  --  132*  --  134* 134*  K 4.3   < > 4.4   < > 4.2   < > 4.4 4.4  CL 98  --  96*  --  95*  --  96* 98  CO2 30  --  28  --  28  --  28 26  GLUCOSE 114*  --  110*  --  115*  --  104* 105*  BUN 17  --  17  --  18  --  17 18  CREATININE 0.92  --  0.87  --  1.01  --  1.06 1.08  CALCIUM 8.3*  --  8.3*  --  8.6*  --  8.7* 8.9  MG  --   --   --   --   --   --   --  2.3   < > = values in this interval not displayed.   GFR Estimated Creatinine Clearance: 137.3 mL/min (by C-G formula based on SCr of 1.08 mg/dL). Liver Function Tests: No results for input(s): AST, ALT, ALKPHOS, BILITOT, PROT, ALBUMIN in the last 168 hours. No results for input(s): LIPASE, AMYLASE in the last 168  hours. No results for input(s): AMMONIA in the last 168 hours. Coagulation profile No results for input(s): INR, PROTIME in the last 168 hours.  CBC: Recent Labs  Lab 12/04/19 0435 12/05/19 0407 12/06/19 0500 12/07/19 0614 12/09/19 0500  WBC 11.8* 11.3* 8.8 7.4 6.7  NEUTROABS 9.2* 8.7* 6.6  --   --   HGB 8.7* 9.5* 9.1* 9.4* 9.8*  HCT 28.2* 30.2* 29.2* 30.7* 31.1*  MCV 70.0* 67.9* 68.2* 70.6* 68.2*  PLT 305 322 278 312 310   Cardiac Enzymes: No results for input(s): CKTOTAL, CKMB, CKMBINDEX, TROPONINI in the last 168 hours. BNP (last 3 results) No results for input(s): PROBNP in the last 8760 hours. CBG: No results for input(s): GLUCAP in the last 168 hours. D-Dimer: No results for input(s): DDIMER in the last 72 hours. Hgb A1c: No results for input(s): HGBA1C in the last 72 hours. Lipid Profile: No results for input(s): CHOL, HDL, LDLCALC, TRIG, CHOLHDL, LDLDIRECT in the last 72 hours. Thyroid function studies: No results for input(s): TSH, T4TOTAL, T3FREE,  THYROIDAB in the last 72 hours.  Invalid input(s): FREET3 Anemia work up: No results for input(s): VITAMINB12, FOLATE, FERRITIN, TIBC, IRON, RETICCTPCT in the last 72 hours. Sepsis Labs: Recent Labs  Lab 12/05/19 0407 12/06/19 0500 12/07/19 0614 12/09/19 0500  WBC 11.3* 8.8 7.4 6.7    Microbiology Recent Results (from the past 240 hour(s))  CULTURE, BLOOD (ROUTINE X 2) w Reflex to ID Panel     Status: None   Collection Time: 12/01/19 12:49 AM   Specimen: BLOOD  Result Value Ref Range Status   Specimen Description BLOOD RFOA  Final   Special Requests BOTTLES DRAWN AEROBIC AND ANAEROBIC BCAV  Final   Culture   Final    NO GROWTH 5 DAYS Performed at [facility], [address] [place], [place] [address]    Report Status 12/06/2019 FINAL  Final  CULTURE, BLOOD (ROUTINE X 2) w Reflex to ID Panel     Status: None   Collection Time: 12/01/19  1:05 AM   Specimen: BLOOD  Result Value Ref Range  Status   Specimen Description BLOOD LEFT HAND  Final   Special Requests BOTTLES DRAWN AEROBIC AND ANAEROBIC BCAV  Final   Culture   Final    NO GROWTH 5 DAYS Performed at [facility], [address] [place], [place] [address]    Report Status 12/06/2019 FINAL  Final    Procedures and diagnostic studies:  MR FOOT LEFT W WO CONTRAST  Result Date: 12/09/2019 CLINICAL DATA:  Painful ankle and foot question infection EXAM: MRI OF THE LEFT ANKLE WITHOUT AND WITH CONTRAST; MRI OF THE LEFT FOREFOOT WITHOUT AND WITH CONTRAST TECHNIQUE: Multiplanar, multisequence MR imaging of the ankle was performed before and after the administration of intravenous contrast. CONTRAST:  4mL GADAVIST GADOBUTROL 1 MMOL/ML IV SOLN COMPARISON:  None. FINDINGS: Bones/Joint/Cartilage Normal osseous marrow signal seen throughout. No areas destruction, periosteal reaction, or abnormal enhancement. The articular surfaces appear to be maintained. No large joint effusion is seen. Calcaneal enthesophyte seen on the plantar surface. Ligaments The Lisfranc ligaments are intact. Muscles and Tendons The muscles surrounding the ankle are intact without focal atrophy or tear. The muscles in the forefoot there is diffusely increased signal with atrophy likely from chronic denervation changes. The flexor and extensor tendons appear to be intact. There is mildly increased signal seen at the insertion site of the Achilles tendon, however it is intact. Soft tissue Along the medial aspect of the ankle there is a area of possible superficial ulceration with a tiny loculated collection measuring 1.9 x 0.8 x 5.1 cm and overlying subcutaneous edema. The collection appears to extend overlying the medial distal tibia to the level the mid talus. IMPRESSION: IMPRESSION Superficial area of ulceration overlying the medial aspect of the distal tibia and ankle with a small multilocular subcutaneous collection measuring 1.9 x 0.8 x 5.1 cm, likely small  subcutaneous abscess. No evidence of osteomyelitis. Electronically Signed   By: Jonna Clark M.D.   On: 12/09/2019 19:59   MR KNEE LEFT WO CONTRAST  Result Date: 12/09/2019 CLINICAL DATA:  Leg cellulitis, query septic arthritis EXAM: MRI OF THE LEFT KNEE WITHOUT CONTRAST TECHNIQUE: Multiplanar, multisequence MR imaging of the knee was performed. No intravenous contrast was administered. COMPARISON:  Left femur and tibia/fibula CT examinations from 12/07/2019 FINDINGS: Despite efforts by the technologist and patient, motion artifact is present on today's exam and could not be eliminated. This reduces exam sensitivity and specificity. MENISCI Medial meniscus: Free edge truncation  of the midbody on image 17/11 compatible with radial tear. Diminutive adjacent portions of the posterior and anterior horn with indistinctness of significant segments of the posterior horn especially near the meniscal root likely reflecting additional degenerative tearing. Lateral meniscus: Meniscal flounce in the posterior horn as shown on image 14/11 with suspected free edge tear on images 32 through 33 of series 12 in the midbody. LIGAMENTS Cruciates: Accentuated signal in the anterior cruciate ligament suggesting degeneration but without overt tear identified. PCL intact. Collaterals: Small focal tear in the proximal medial collateral ligament, image 11/9. Although torn the proximal MCL is not detached. The fibular collateral ligament appears intact. CARTILAGE Patellofemoral:  Minimal marginal spurring. Medial:  Mild degenerative chondral thinning.  Marginal spurring. Lateral:  Mild degenerative chondral thinning.  Marginal spurring. Joint: Small joint effusion. Equivocal synovitis, less than I would expect if the joint were infected. Field heterogeneity along the upper portion of the joint effusion. Popliteal Fossa: Edema tracks along the superficial fascia margin of the posterior compartmental musculature of the calf (along the  gastrocnemius muscles) for example on image 34/9. Extensor Mechanism: Distal quadriceps tendon obscured by field heterogeneity. Medial patellofemoral ligament difficult to assess due to motion artifact but probably intact. There is some subcortical marrow edema along the medial femoral epicondyle in the vicinity of the attachment of the medial patellofemoral ligament which could be a secondary sign of injury but could also simply be from low-grade bone bruising. Bones: No additional bony findings compared to those discussed above. Other: Subcutaneous edema diffusely in the upper calf, and primarily posteriorly and medially in the lower thigh. Cellulitis not excluded. IMPRESSION: 1. Subcutaneous edema diffusely in the upper calf, and primarily posteriorly and medially in the lower thigh. Cellulitis not excluded. 2. Small joint effusion extending into the suprapatellar bursa. In this setting, it is difficult to exclude the possibility of septic joint by imaging, but there is less synovitis than is typically encountered in the setting of septic joint. 3. Free edge truncation of the midbody medial meniscus compatible with radial tear. Diminutive adjacent portions of the posterior and anterior horn with indistinctness of significant segments of the posterior horn especially near the meniscal root likely reflecting additional degenerative tearing. 4. Meniscal flounce in the posterior horn lateral meniscus with suspected free edge tear in the midbody. 5. Small focal tear in the proximal medial collateral ligament. Although torn, the proximal MCL is not detached. 6. Accentuated signal in the anterior cruciate ligament suggesting degeneration but without overt tear identified. 7. edema tracks along the superficial fascia margin of the posterior compartmental musculature of the calf (along the gastrocnemius muscles) for example on image 34/9. 8. There is some subcortical marrow edema along the medial femoral epicondyle in the  vicinity of the attachment of the medial patellofemoral ligament, which could be a secondary sign of injury but could also simply be from low-grade bone bruising. 9. Despite efforts by the technologist and patient, motion artifact is present on today's exam and could not be eliminated. This reduces exam sensitivity and specificity. Electronically Signed   By: Gaylyn Rong M.D.   On: 12/09/2019 08:11   MR ANKLE LEFT W WO CONTRAST  Result Date: 12/09/2019 CLINICAL DATA:  Painful ankle and foot question infection EXAM: MRI OF THE LEFT ANKLE WITHOUT AND WITH CONTRAST; MRI OF THE LEFT FOREFOOT WITHOUT AND WITH CONTRAST TECHNIQUE: Multiplanar, multisequence MR imaging of the ankle was performed before and after the administration of intravenous contrast. CONTRAST:  10mL GADAVIST GADOBUTROL 1  MMOL/ML IV SOLN COMPARISON:  None. FINDINGS: Bones/Joint/Cartilage Normal osseous marrow signal seen throughout. No areas destruction, periosteal reaction, or abnormal enhancement. The articular surfaces appear to be maintained. No large joint effusion is seen. Calcaneal enthesophyte seen on the plantar surface. Ligaments The Lisfranc ligaments are intact. Muscles and Tendons The muscles surrounding the ankle are intact without focal atrophy or tear. The muscles in the forefoot there is diffusely increased signal with atrophy likely from chronic denervation changes. The flexor and extensor tendons appear to be intact. There is mildly increased signal seen at the insertion site of the Achilles tendon, however it is intact. Soft tissue Along the medial aspect of the ankle there is a area of possible superficial ulceration with a tiny loculated collection measuring 1.9 x 0.8 x 5.1 cm and overlying subcutaneous edema. The collection appears to extend overlying the medial distal tibia to the level the mid talus. IMPRESSION: IMPRESSION Superficial area of ulceration overlying the medial aspect of the distal tibia and ankle with a  small multilocular subcutaneous collection measuring 1.9 x 0.8 x 5.1 cm, likely small subcutaneous abscess. No evidence of osteomyelitis. Electronically Signed   By: Jonna ClarkBindu  Avutu M.D.   On: 12/09/2019 19:59               LOS: 16 days   Alexsus Papadopoulos  [facility]   Pager on [email or web address]. If 7PM-7AM, please contact night-coverage at [email or web address]     12/10/2019, 3:46 PM

## 2019-12-10 NOTE — Progress Notes (Signed)
Occupational Therapy Treatment Patient Details Name: Alfred [name] MRN: [id number] DOB: [birth year] Today's Date: 12/10/2019    History of present illness Alfred [name] is a 54yoM who comes to [facility] on 8/20 c pain and LEE. PMH: chronic BLE venous statis, hepatits. Pt admitted with BLE cellulitis. Since arrival pt having issues with fever and tachy into 140s-160s. At time of evaluation, pt continues to have significant LEE bilat.   OT comments  Pt seen for OT treatment this date to f/u re: safety with ADLs/ADL mobility. Pt endorses not being OOB for 1-2 days now. He is pleasant and agreeable to attempting some functional mobility. Pt requires SETUP to MIN A with seated UB dressing, MAX to TOTAL A with bed level and seated LB dressing (to don socks, but anticipate pt could better contribute to wide-leg elastic waist pants, underwear, could benefit from initiating AE education, added to OT care plan LB dressing goal). In addition, session consisted of: engaging pt in fxl mobility to/from door with RW with MIN A/CGA with pt reporting some dizziness towards end of bout (reports that this happens to him when he hasn't been OOB in a few days) and pt performs sit to stand with RW with MIN/MOD A from elevated bed surface for a second time while RN assists with changing bedding. Finally, OT assists in pt repositioning in bed with MIN A. Pt continues to progress towards goals. Goals updated this date. Pt has been declined for rehab and best recommendation for f/u therapy is HHOT and 24/7 SUPV for safety. Will continue to follow in acute setting.    Follow Up Recommendations  Home health OT;Supervision/Assistance - 24 hour    Equipment Recommendations  Other (comment) (bariatric drop arm commode)    Recommendations for Other Services      Precautions / Restrictions Precautions Precautions: Fall Precaution Comments: bilat leg wounds, Left knee pain, LLE buckling Restrictions Weight Bearing  Restrictions: No       Mobility Bed Mobility Overal bed mobility: Needs Assistance Bed Mobility: Supine to Sit;Sit to Supine     Supine to sit: Min assist;HOB elevated Sit to supine: Supervision   General bed mobility comments: use of bed rails  Transfers Overall transfer level: Needs assistance Equipment used: Rolling walker (2 wheeled) Transfers: Sit to/from Stand Sit to Stand: Min assist;Mod assist;From elevated surface        Lateral/Scoot Transfers: Mod assist;Max assist General transfer comment: pt with poor eccentric control for stand to sit 2/2 fatigue after completing fxl mobility to door and back with CGA/MIN A    Balance Overall balance assessment: Needs assistance Sitting-balance support: Feet supported Sitting balance-Leahy Scale: Good     Standing balance support: During functional activity;Bilateral upper extremity supported Standing balance-Leahy Scale: Fair Standing balance comment: requires B UE support                           ADL either performed or assessed with clinical judgement   ADL Overall ADL's : Needs assistance/impaired                 Upper Body Dressing : Set up;Minimal assistance;Sitting   Lower Body Dressing: Maximal assistance;Bed level Lower Body Dressing Details (indicate cue type and reason): Pt makes effort to assist in LB dressing with at least R LE, but OT ultimately completes all components of task at bed level             Functional mobility during  ADLs: Min guard;Minimal assistance;Rolling walker       Vision Baseline Vision/History: No visual deficits Patient Visual Report: No change from baseline     Perception     Praxis      Cognition Arousal/Alertness: Awake/alert Behavior During Therapy: WFL for tasks assessed/performed Overall Cognitive Status: Within Functional Limits for tasks assessed                                 General Comments: A & Ox4. Pt agreeable to  session        Exercises Other Exercises Other Exercises: OT engages pt in fxl mobility to/from door with RW with MIN A/CGA with pt reporting some dizziness towards end of bout, reports that this happens to him when he hasn't been OOB in a few days. OT engages pt in dressing tasks with pt requiring setup/MIN A with UB and MAX A with LB (makes meaningful effort, but unable d/t limited ROM). OT has pt perform sit to stand for a second time while RN assists with changing bedding. And finally, OT assists in pt repositioning in bed with MIN A.   Shoulder Instructions       General Comments      Pertinent Vitals/ Pain       Pain Assessment: Faces Faces Pain Scale: Hurts little more Pain Location: LLE with movements Pain Descriptors / Indicators: Discomfort;Grimacing Pain Intervention(s): Monitored during session;Repositioned  Home Living                                          Prior Functioning/Environment              Frequency  Min 1X/week        Progress Toward Goals  OT Goals(current goals can now be found in the care plan section)  Progress towards OT goals: Progressing toward goals  Acute Rehab OT Goals Patient Stated Goal: to move better and be able to sit on the front porch at home OT Goal Formulation: With patient Time For Goal Achievement: 12/24/19 Potential to Achieve Goals: Good ADL Goals Pt Will Perform Lower Body Dressing: with min assist;sit to/from stand (with AE PRN)  Plan Discharge plan remains appropriate    Co-evaluation                 AM-PAC OT "6 Clicks" Daily Activity     Outcome Measure   Help from another person eating meals?: None Help from another person taking care of personal grooming?: None Help from another person toileting, which includes using toliet, bedpan, or urinal?: A Lot Help from another person bathing (including washing, rinsing, drying)?: A Lot Help from another person to put on and taking off  regular upper body clothing?: A Little Help from another person to put on and taking off regular lower body clothing?: A Lot 6 Click Score: 17    End of Session Equipment Utilized During Treatment: Rolling walker  OT Visit Diagnosis: Muscle weakness (generalized) (M62.81);Pain Pain - Right/Left: Left Pain - part of body: Leg   Activity Tolerance Patient tolerated treatment well   Patient Left in bed;with call bell/phone within reach;with bed alarm set;with nursing/sitter in room   Nurse Communication Mobility status        Time: 0623-7628 OT Time Calculation (min): 41 min  Charges: OT General Charges $  OT Visit: 1 Visit OT Treatments $Self Care/Home Management : 23-37 mins $Therapeutic Activity: 8-22 mins  Rejeana Brock, MS, OTR/L ascom [phone] 12/10/19, 6:23 PM

## 2019-12-10 NOTE — Progress Notes (Signed)
Progress Note  Patient Name: Alfred [name] Date of Encounter: 12/10/2019  [facility] Cardiologist: [name], MD   Subjective   Patient feels okay, denies shortness of breath.  -4.6 L over the past 24 hours.  Inpatient Medications    Scheduled Meds: . amiodarone  200 mg Oral BID  . [START ON 12/13/2019] amiodarone  200 mg Oral Daily  . apixaban  5 mg Oral BID  . Chlorhexidine Gluconate Cloth  6 each Topical Daily  . ciprofloxacin  500 mg Oral BID  . diclofenac Sodium  2 g Topical QID  . polyethylene glycol  17 g Oral Daily  . potassium chloride  20 mEq Oral Daily  . senna  2 tablet Oral QHS  . sodium chloride flush  10-40 mL Intracatheter Q12H  . torsemide  40 mg Oral BID   Continuous Infusions: . sodium chloride 10 mL (12/10/19 0047)  . ampicillin-sulbactam (UNASYN) IV 3 g (12/10/19 1138)  . sodium chloride     PRN Meds: sodium chloride, acetaminophen **OR** acetaminophen, albuterol, bisacodyl, guaiFENesin, HYDROcodone-acetaminophen, ipratropium-albuterol, melatonin, morphine injection, ondansetron **OR** ondansetron (ZOFRAN) IV, sodium chloride flush   Vital Signs    Vitals:   12/09/19 2104 12/10/19 0541 12/10/19 1020 12/10/19 1141  BP: 126/73 95/64 140/64 111/62  Pulse: 62 62 63 65  Resp:   18 18  Temp: 97.7 F (36.5 C) 98.2 F (36.8 C) 97.7 F (36.5 C) 97.9 F (36.6 C)  TempSrc: Oral Oral Oral Oral  SpO2: 100% 98% 98% 96%  Weight:  (!) 166.3 kg    Height:        Intake/Output Summary (Last 24 hours) at 12/10/2019 1145 Last data filed at 12/10/2019 1000 Gross per 24 hour  Intake 340 ml  Output 3865 ml  Net -3525 ml   Last 3 Weights 12/10/2019 12/09/2019 12/08/2019  Weight (lbs) 366 lb 11.2 oz 374 lb 11.2 oz 378 lb 8.5 oz  Weight (kg) 166.334 kg 169.963 kg 171.7 kg      Telemetry    Currently off telemetry- Personally Reviewed  ECG    New tracing obtained- Personally Reviewed  Physical Exam   GEN: Well nourished, well developed in no  acute distress HEENT: Normal NECK: No JVD; No carotid bruits CARDIAC: RRR, no murmurs, rubs, gallops RESPIRATORY: Decreased breath sounds at bases, otherwise clear ABDOMEN: Soft, non-tender, non-distended MUSCULOSKELETAL: No edema; feet wrapped NEUROLOGIC: Alert and oriented x 3 PSYCHIATRIC: Normal affect   Labs    High Sensitivity Troponin:   Recent Labs  Lab 11/26/19 1811 11/26/19 2000  TROPONINIHS 27* 27*      Chemistry Recent Labs  Lab 12/06/19 0500 12/07/19 0614 12/08/19 0035  NA 132* 134* 134*  K 4.2 4.4 4.4  CL 95* 96* 98  CO2 28 28 26   GLUCOSE 115* 104* 105*  BUN 18 17 18   CREATININE 1.01 1.06 1.08  CALCIUM 8.6* 8.7* 8.9  GFRNONAA >60 >60 >60  GFRAA >60 >60 >60  ANIONGAP 9 10 10      Hematology Recent Labs  Lab 12/06/19 0500 12/07/19 0614 12/09/19 0500  WBC 8.8 7.4 6.7  RBC 4.28 4.35 4.56  HGB 9.1* 9.4* 9.8*  HCT 29.2* 30.7* 31.1*  MCV 68.2* 70.6* 68.2*  MCH 21.3* 21.6* 21.5*  MCHC 31.2 30.6 31.5  RDW 16.8* 17.3* 17.2*  PLT 278 312 310    BNPNo results for input(s): BNP, PROBNP in the last 168 hours.   DDimer No results for input(s): DDIMER in the last 168  hours.   Radiology    MR FOOT LEFT W WO CONTRAST  Result Date: 12/09/2019 CLINICAL DATA:  Painful ankle and foot question infection EXAM: MRI OF THE LEFT ANKLE WITHOUT AND WITH CONTRAST; MRI OF THE LEFT FOREFOOT WITHOUT AND WITH CONTRAST TECHNIQUE: Multiplanar, multisequence MR imaging of the ankle was performed before and after the administration of intravenous contrast. CONTRAST:  72mL GADAVIST GADOBUTROL 1 MMOL/ML IV SOLN COMPARISON:  None. FINDINGS: Bones/Joint/Cartilage Normal osseous marrow signal seen throughout. No areas destruction, periosteal reaction, or abnormal enhancement. The articular surfaces appear to be maintained. No large joint effusion is seen. Calcaneal enthesophyte seen on the plantar surface. Ligaments The Lisfranc ligaments are intact. Muscles and Tendons The muscles  surrounding the ankle are intact without focal atrophy or tear. The muscles in the forefoot there is diffusely increased signal with atrophy likely from chronic denervation changes. The flexor and extensor tendons appear to be intact. There is mildly increased signal seen at the insertion site of the Achilles tendon, however it is intact. Soft tissue Along the medial aspect of the ankle there is a area of possible superficial ulceration with a tiny loculated collection measuring 1.9 x 0.8 x 5.1 cm and overlying subcutaneous edema. The collection appears to extend overlying the medial distal tibia to the level the mid talus. IMPRESSION: IMPRESSION Superficial area of ulceration overlying the medial aspect of the distal tibia and ankle with a small multilocular subcutaneous collection measuring 1.9 x 0.8 x 5.1 cm, likely small subcutaneous abscess. No evidence of osteomyelitis. Electronically Signed   By: Jonna Clark M.D.   On: 12/09/2019 19:59   MR KNEE LEFT WO CONTRAST  Result Date: 12/09/2019 CLINICAL DATA:  Leg cellulitis, query septic arthritis EXAM: MRI OF THE LEFT KNEE WITHOUT CONTRAST TECHNIQUE: Multiplanar, multisequence MR imaging of the knee was performed. No intravenous contrast was administered. COMPARISON:  Left femur and tibia/fibula CT examinations from 12/07/2019 FINDINGS: Despite efforts by the technologist and patient, motion artifact is present on today's exam and could not be eliminated. This reduces exam sensitivity and specificity. MENISCI Medial meniscus: Free edge truncation of the midbody on image 17/11 compatible with radial tear. Diminutive adjacent portions of the posterior and anterior horn with indistinctness of significant segments of the posterior horn especially near the meniscal root likely reflecting additional degenerative tearing. Lateral meniscus: Meniscal flounce in the posterior horn as shown on image 14/11 with suspected free edge tear on images 32 through 33 of series 12  in the midbody. LIGAMENTS Cruciates: Accentuated signal in the anterior cruciate ligament suggesting degeneration but without overt tear identified. PCL intact. Collaterals: Small focal tear in the proximal medial collateral ligament, image 11/9. Although torn the proximal MCL is not detached. The fibular collateral ligament appears intact. CARTILAGE Patellofemoral:  Minimal marginal spurring. Medial:  Mild degenerative chondral thinning.  Marginal spurring. Lateral:  Mild degenerative chondral thinning.  Marginal spurring. Joint: Small joint effusion. Equivocal synovitis, less than I would expect if the joint were infected. Field heterogeneity along the upper portion of the joint effusion. Popliteal Fossa: Edema tracks along the superficial fascia margin of the posterior compartmental musculature of the calf (along the gastrocnemius muscles) for example on image 34/9. Extensor Mechanism: Distal quadriceps tendon obscured by field heterogeneity. Medial patellofemoral ligament difficult to assess due to motion artifact but probably intact. There is some subcortical marrow edema along the medial femoral epicondyle in the vicinity of the attachment of the medial patellofemoral ligament which could be a secondary sign of  injury but could also simply be from low-grade bone bruising. Bones: No additional bony findings compared to those discussed above. Other: Subcutaneous edema diffusely in the upper calf, and primarily posteriorly and medially in the lower thigh. Cellulitis not excluded. IMPRESSION: 1. Subcutaneous edema diffusely in the upper calf, and primarily posteriorly and medially in the lower thigh. Cellulitis not excluded. 2. Small joint effusion extending into the suprapatellar bursa. In this setting, it is difficult to exclude the possibility of septic joint by imaging, but there is less synovitis than is typically encountered in the setting of septic joint. 3. Free edge truncation of the midbody medial  meniscus compatible with radial tear. Diminutive adjacent portions of the posterior and anterior horn with indistinctness of significant segments of the posterior horn especially near the meniscal root likely reflecting additional degenerative tearing. 4. Meniscal flounce in the posterior horn lateral meniscus with suspected free edge tear in the midbody. 5. Small focal tear in the proximal medial collateral ligament. Although torn, the proximal MCL is not detached. 6. Accentuated signal in the anterior cruciate ligament suggesting degeneration but without overt tear identified. 7. edema tracks along the superficial fascia margin of the posterior compartmental musculature of the calf (along the gastrocnemius muscles) for example on image 34/9. 8. There is some subcortical marrow edema along the medial femoral epicondyle in the vicinity of the attachment of the medial patellofemoral ligament, which could be a secondary sign of injury but could also simply be from low-grade bone bruising. 9. Despite efforts by the technologist and patient, motion artifact is present on today's exam and could not be eliminated. This reduces exam sensitivity and specificity. Electronically Signed   By: Gaylyn RongWalter  Liebkemann M.D.   On: 12/09/2019 08:11   MR ANKLE LEFT W WO CONTRAST  Result Date: 12/09/2019 CLINICAL DATA:  Painful ankle and foot question infection EXAM: MRI OF THE LEFT ANKLE WITHOUT AND WITH CONTRAST; MRI OF THE LEFT FOREFOOT WITHOUT AND WITH CONTRAST TECHNIQUE: Multiplanar, multisequence MR imaging of the ankle was performed before and after the administration of intravenous contrast. CONTRAST:  10mL GADAVIST GADOBUTROL 1 MMOL/ML IV SOLN COMPARISON:  None. FINDINGS: Bones/Joint/Cartilage Normal osseous marrow signal seen throughout. No areas destruction, periosteal reaction, or abnormal enhancement. The articular surfaces appear to be maintained. No large joint effusion is seen. Calcaneal enthesophyte seen on the plantar  surface. Ligaments The Lisfranc ligaments are intact. Muscles and Tendons The muscles surrounding the ankle are intact without focal atrophy or tear. The muscles in the forefoot there is diffusely increased signal with atrophy likely from chronic denervation changes. The flexor and extensor tendons appear to be intact. There is mildly increased signal seen at the insertion site of the Achilles tendon, however it is intact. Soft tissue Along the medial aspect of the ankle there is a area of possible superficial ulceration with a tiny loculated collection measuring 1.9 x 0.8 x 5.1 cm and overlying subcutaneous edema. The collection appears to extend overlying the medial distal tibia to the level the mid talus. IMPRESSION: IMPRESSION Superficial area of ulceration overlying the medial aspect of the distal tibia and ankle with a small multilocular subcutaneous collection measuring 1.9 x 0.8 x 5.1 cm, likely small subcutaneous abscess. No evidence of osteomyelitis. Electronically Signed   By: Jonna ClarkBindu  Avutu M.D.   On: 12/09/2019 19:59    Cardiac Studies   Echo TEE 12/07/19 1. Left ventricular ejection fraction, by estimation, is 55 . The left  ventricle has normal function. The left ventricle has  no regional wall  motion abnormalities.  2. Right ventricular systolic function is normal. The right ventricular  size is normal.  3. Left atrial size was mildly dilated. No left atrial/left atrial  appendage thrombus was detected.  4. No valve vegetation noted.  5. Picc line catheter noted in the right atrium.   Echo 11/27/19 1. Left ventricular ejection fraction, by estimation, is 50 to 55%. The  left ventricle has low normal function. Left ventricular endocardial  border not optimally defined to evaluate regional wall motion. The left  ventricular internal cavity size was  mildly dilated. There is moderate left ventricular hypertrophy. Left  ventricular diastolic function could not be evaluated.  2.  Right ventricular systolic function is normal. The right ventricular  size is normal. Tricuspid regurgitation signal is inadequate for assessing  PA pressure.  3. Left atrial size was mildly dilated.  4. The mitral valve is normal in structure. No evidence of mitral valve  regurgitation. No evidence of mitral stenosis.  5. The aortic valve is normal in structure. Aortic valve regurgitation is  not visualized. No aortic stenosis is present.  6. Very challenging study with limited views and significant tachycardia  during the exam.  Patient Profile     54 y.o. male with history of morbid obesity, heart failure preserved ejection fraction, atrial flutter being seen due to volume overload and atrial flutter.  Assessment & Plan    1. Heart failure preserved ejection fraction -Appears euvolemic -Net -4.6 L over the past 24 hours -Continue torsemide 40 mg twice daily. -Monitor creatinine with diuresing,  2. Atrial flutter -Currently in sinus rhythm -Continue amiodarone 200 mg twice daily x1 week then decrease to 200 mg daily after. -BP low normal, stop Lopressor. -Continue Eliquis for now. -Recommend Case management consult regarding Eliquis because assistance.  3.  Leg swelling, pain -Management and dressing changes as per podiatry   Greater than 50% was spent in counseling and coordination of care with patient Total encounter time 35 minutes      Signed, Debbe Odea, MD  12/10/2019, 11:45 AM

## 2019-12-10 NOTE — Progress Notes (Addendum)
[facility] / FOOT AND ANKLE SURGERY PROGRESS NOTE  Requesting Physician: Dr. Myriam Forehand  Reason for consult: LLE cellulitis, swelling, possible abscess  Chief Complaint: L leg and foot pain/swelling   HPI: Alfred [name] is a 54 y.o. male who presents resting in bed comfortably.  Patient still complains of some left lower extremity pain at the level of the medial ankle and foot as well as knee and hip.  He notes that the pain has been improved since his admission with compression stockings and with IV antibiotics.  Patient is still awaiting MRI results from yesterday's left foot and ankle scan.  PMHx:  Past Medical History:  Diagnosis Date   Lower extremity edema    Morbid obesity (HCC) 11/27/2019   Tobacco abuse 11/27/2019    Surgical Hx:  Past Surgical History:  Procedure Laterality Date   DEBRIDEMENT OF ABDOMINAL WALL ABSCESS     TEE WITHOUT CARDIOVERSION N/A 12/07/2019   Procedure: TRANSESOPHAGEAL ECHOCARDIOGRAM (TEE);  Surgeon: Antonieta Iba, MD;  Location: [facility] ORS;  Service: Cardiovascular;  Laterality: N/A;    FHx:  Family History  Problem Relation Age of Onset   Heart disease Mother        died in her 14's   Heart disease Father        died in his 64's    Social History:  reports that he has been smoking cigarettes. He has been smoking about 0.10 packs per day. He has never used smokeless tobacco. He reports current alcohol use. He reports current drug use. Drug: Marijuana.  Allergies: No Known Allergies  Review of Systems: General ROS: negative Respiratory ROS: no cough, shortness of breath, or wheezing Cardiovascular ROS: no chest pain or dyspnea on exertion Gastrointestinal ROS: no abdominal pain, change in bowel habits, or black or bloody stools Musculoskeletal ROS: positive for - gait disturbance, joint pain, joint stiffness and joint swelling Neurological ROS: positive for - numbness/tingling Dermatological ROS: positive for Multiple venous type  ulcerations to bilateral lower extremities  Medications Prior to Admission  Medication Sig Dispense Refill   acetaminophen (TYLENOL) 325 MG tablet Take by mouth.     lisinopril (ZESTRIL) 20 MG tablet Take by mouth. (Patient not taking: Reported on 11/29/2019)      Physical Exam: General: Alert and oriented.  No apparent distress.  Vascular: DP/PT pulses palpable bilateral.  No hair growth noted to digits.  Capillary fill time appears to be intact to digits bilateral.  Moderate amount of nonpitting edema present to bilateral lower extremities with lichenification of skin.  Neuro: Light touch sensation reduced to digits bilateral.  Derm: No overt obvious areas of fluctuance palpable to the left lower extremity and at the area of the supposed abscess at the medial ankle.  Once hyperkeratotic tissue was removed from the medial ankle there revealed no wound to the site with no evidence of acute infection, minimal to no erythema present to this area, no drainage. Gross lichenification of skin noted circumferentially to bilateral lower extremities from the dorsal foot to proximal tibia bilateral.  Small venous type blister present to BLE with no overt acute SOI present.    MSK: Pain on palpation to the left dorsal foot, ankle, left knee, left hip.  Results for orders placed or performed during the hospital encounter of 11/24/19 (from the past 48 hour(s))  CBC     Status: Abnormal   Collection Time: 12/09/19  5:00 AM  Result Value Ref Range   WBC 6.7 4.0 - 10.5  K/uL   RBC 4.56 4.22 - 5.81 MIL/uL   Hemoglobin 9.8 (L) 13.0 - 17.0 g/dL   HCT 96.7 (L) 39 - 52 %   MCV 68.2 (L) 80.0 - 100.0 fL   MCH 21.5 (L) 26.0 - 34.0 pg   MCHC 31.5 30.0 - 36.0 g/dL   RDW 59.1 (H) 63.8 - 46.6 %   Platelets 310 150 - 400 K/uL   nRBC 0.0 0.0 - 0.2 %    Comment: Performed at [facility], [address] [place], [place] [address]   MR FOOT LEFT W WO CONTRAST  Result Date: 12/09/2019 CLINICAL DATA:   Painful ankle and foot question infection EXAM: MRI OF THE LEFT ANKLE WITHOUT AND WITH CONTRAST; MRI OF THE LEFT FOREFOOT WITHOUT AND WITH CONTRAST TECHNIQUE: Multiplanar, multisequence MR imaging of the ankle was performed before and after the administration of intravenous contrast. CONTRAST:  74mL GADAVIST GADOBUTROL 1 MMOL/ML IV SOLN COMPARISON:  None. FINDINGS: Bones/Joint/Cartilage Normal osseous marrow signal seen throughout. No areas destruction, periosteal reaction, or abnormal enhancement. The articular surfaces appear to be maintained. No large joint effusion is seen. Calcaneal enthesophyte seen on the plantar surface. Ligaments The Lisfranc ligaments are intact. Muscles and Tendons The muscles surrounding the ankle are intact without focal atrophy or tear. The muscles in the forefoot there is diffusely increased signal with atrophy likely from chronic denervation changes. The flexor and extensor tendons appear to be intact. There is mildly increased signal seen at the insertion site of the Achilles tendon, however it is intact. Soft tissue Along the medial aspect of the ankle there is a area of possible superficial ulceration with a tiny loculated collection measuring 1.9 x 0.8 x 5.1 cm and overlying subcutaneous edema. The collection appears to extend overlying the medial distal tibia to the level the mid talus. IMPRESSION: IMPRESSION Superficial area of ulceration overlying the medial aspect of the distal tibia and ankle with a small multilocular subcutaneous collection measuring 1.9 x 0.8 x 5.1 cm, likely small subcutaneous abscess. No evidence of osteomyelitis. Electronically Signed   By: Jonna Clark M.D.   On: 12/09/2019 19:59   MR KNEE LEFT WO CONTRAST  Result Date: 12/09/2019 CLINICAL DATA:  Leg cellulitis, query septic arthritis EXAM: MRI OF THE LEFT KNEE WITHOUT CONTRAST TECHNIQUE: Multiplanar, multisequence MR imaging of the knee was performed. No intravenous contrast was administered.  COMPARISON:  Left femur and tibia/fibula CT examinations from 12/07/2019 FINDINGS: Despite efforts by the technologist and patient, motion artifact is present on today's exam and could not be eliminated. This reduces exam sensitivity and specificity. MENISCI Medial meniscus: Free edge truncation of the midbody on image 17/11 compatible with radial tear. Diminutive adjacent portions of the posterior and anterior horn with indistinctness of significant segments of the posterior horn especially near the meniscal root likely reflecting additional degenerative tearing. Lateral meniscus: Meniscal flounce in the posterior horn as shown on image 14/11 with suspected free edge tear on images 32 through 33 of series 12 in the midbody. LIGAMENTS Cruciates: Accentuated signal in the anterior cruciate ligament suggesting degeneration but without overt tear identified. PCL intact. Collaterals: Small focal tear in the proximal medial collateral ligament, image 11/9. Although torn the proximal MCL is not detached. The fibular collateral ligament appears intact. CARTILAGE Patellofemoral:  Minimal marginal spurring. Medial:  Mild degenerative chondral thinning.  Marginal spurring. Lateral:  Mild degenerative chondral thinning.  Marginal spurring. Joint: Small joint effusion. Equivocal synovitis, less than I would expect if the joint  were infected. Field heterogeneity along the upper portion of the joint effusion. Popliteal Fossa: Edema tracks along the superficial fascia margin of the posterior compartmental musculature of the calf (along the gastrocnemius muscles) for example on image 34/9. Extensor Mechanism: Distal quadriceps tendon obscured by field heterogeneity. Medial patellofemoral ligament difficult to assess due to motion artifact but probably intact. There is some subcortical marrow edema along the medial femoral epicondyle in the vicinity of the attachment of the medial patellofemoral ligament which could be a secondary  sign of injury but could also simply be from low-grade bone bruising. Bones: No additional bony findings compared to those discussed above. Other: Subcutaneous edema diffusely in the upper calf, and primarily posteriorly and medially in the lower thigh. Cellulitis not excluded. IMPRESSION: 1. Subcutaneous edema diffusely in the upper calf, and primarily posteriorly and medially in the lower thigh. Cellulitis not excluded. 2. Small joint effusion extending into the suprapatellar bursa. In this setting, it is difficult to exclude the possibility of septic joint by imaging, but there is less synovitis than is typically encountered in the setting of septic joint. 3. Free edge truncation of the midbody medial meniscus compatible with radial tear. Diminutive adjacent portions of the posterior and anterior horn with indistinctness of significant segments of the posterior horn especially near the meniscal root likely reflecting additional degenerative tearing. 4. Meniscal flounce in the posterior horn lateral meniscus with suspected free edge tear in the midbody. 5. Small focal tear in the proximal medial collateral ligament. Although torn, the proximal MCL is not detached. 6. Accentuated signal in the anterior cruciate ligament suggesting degeneration but without overt tear identified. 7. edema tracks along the superficial fascia margin of the posterior compartmental musculature of the calf (along the gastrocnemius muscles) for example on image 34/9. 8. There is some subcortical marrow edema along the medial femoral epicondyle in the vicinity of the attachment of the medial patellofemoral ligament, which could be a secondary sign of injury but could also simply be from low-grade bone bruising. 9. Despite efforts by the technologist and patient, motion artifact is present on today's exam and could not be eliminated. This reduces exam sensitivity and specificity. Electronically Signed   By: Gaylyn Rong M.D.   On:  12/09/2019 08:11   MR ANKLE LEFT W WO CONTRAST  Result Date: 12/09/2019 CLINICAL DATA:  Painful ankle and foot question infection EXAM: MRI OF THE LEFT ANKLE WITHOUT AND WITH CONTRAST; MRI OF THE LEFT FOREFOOT WITHOUT AND WITH CONTRAST TECHNIQUE: Multiplanar, multisequence MR imaging of the ankle was performed before and after the administration of intravenous contrast. CONTRAST:  33mL GADAVIST GADOBUTROL 1 MMOL/ML IV SOLN COMPARISON:  None. FINDINGS: Bones/Joint/Cartilage Normal osseous marrow signal seen throughout. No areas destruction, periosteal reaction, or abnormal enhancement. The articular surfaces appear to be maintained. No large joint effusion is seen. Calcaneal enthesophyte seen on the plantar surface. Ligaments The Lisfranc ligaments are intact. Muscles and Tendons The muscles surrounding the ankle are intact without focal atrophy or tear. The muscles in the forefoot there is diffusely increased signal with atrophy likely from chronic denervation changes. The flexor and extensor tendons appear to be intact. There is mildly increased signal seen at the insertion site of the Achilles tendon, however it is intact. Soft tissue Along the medial aspect of the ankle there is a area of possible superficial ulceration with a tiny loculated collection measuring 1.9 x 0.8 x 5.1 cm and overlying subcutaneous edema. The collection appears to extend overlying the medial distal  tibia to the level the mid talus. IMPRESSION: IMPRESSION Superficial area of ulceration overlying the medial aspect of the distal tibia and ankle with a small multilocular subcutaneous collection measuring 1.9 x 0.8 x 5.1 cm, likely small subcutaneous abscess. No evidence of osteomyelitis. Electronically Signed   By: Jonna ClarkBindu  Avutu M.D.   On: 12/09/2019 19:59    Blood pressure 111/62, pulse 65, temperature 97.9 F (36.6 C), temperature source Oral, resp. rate 18, height 6\' 7"  (2.007 m), weight (!) 166.3 kg, SpO2 96  %.  Assessment 1. Bacteremia secondary to potential left lower extremity wounds/abscess - appears stable at this time with no acute SOI present and no open wounds to the area of concern 2. Chronic venous insufficiency 3. Lymphedema bilateral lower extremities 4. CHF  Plan -Patient seen and examined. -MRI reviewed and discussed with patient in detail.  Appears to show a small fluid collection at the medial aspect of the left ankle with potential open ulceration present to this area.  The abscess to this area or fluid collection appears to be very small. -Paring of callus type material performed to the left medial ankle in the area of the previous ulceration that appeared to be a venous type ulceration present at this level with 15 blade without incident.  No ulceration is revealed at this time and appears to be well closed and healed with no obvious overt signs of infection present to this area. -Believe that the fluid collection could just be from lymphedema or venous stasis dermatitis.  No obvious signs of infection are present clinically and no fluctuance is palpable. -Recommend further treatment with antibiotic therapy based on the organism that was cultured.  If patient has worsening signs of infection in future could potentially perform incision and drainage to this area but for now it appears to be stable with no open wounds. -Discussed case with Dr. Luciana Axeomer, on call ID for the weekend, who agrees with plan. -Recommend Unna boots to bilateral lower extremities.  Wound care consult placed.  Continue dressing changes per wound care orders until Unna boots are applied.  Podiatry team will follow more peripherally at this time with the appropriate follow-up instructions placed in chart.  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  Patient will likely need lymphedema type pumps and  compression stockings which can be orchestrated through outpatient management.  Please reconsult if any further problems arise.  Rosetta PosnerAndrew Carmelo Reidel, DPM 12/10/2019, 12:15 PM

## 2019-12-11 LAB — BASIC METABOLIC PANEL
Anion gap: 17 — ABNORMAL HIGH (ref 5–15)
BUN: 29 mg/dL — ABNORMAL HIGH (ref 6–20)
CO2: 27 mmol/L (ref 22–32)
Calcium: 9.3 mg/dL (ref 8.9–10.3)
Chloride: 91 mmol/L — ABNORMAL LOW (ref 98–111)
Creatinine, Ser: 1.27 mg/dL — ABNORMAL HIGH (ref 0.61–1.24)
GFR calc Af Amer: >60 mL/min (ref >60)
GFR calc non Af Amer: >60 mL/min (ref >60)
Glucose, Bld: 147 mg/dL — ABNORMAL HIGH (ref 70–99)
Potassium: 3.9 mmol/L (ref 3.5–5.1)
Sodium: 135 mmol/L (ref 135–145)

## 2019-12-11 MED ORDER — APIXABAN 5 MG PO TABS
5.0000 mg | ORAL_TABLET | Freq: Two times a day (BID) | ORAL | 0 refills | Status: DC
Start: 2019-12-11 — End: 2021-07-15

## 2019-12-11 MED ORDER — TORSEMIDE 20 MG PO TABS
40.0000 mg | ORAL_TABLET | Freq: Every day | ORAL | 0 refills | Status: DC
Start: 2019-12-13 — End: 2021-07-15

## 2019-12-11 MED ORDER — AMIODARONE HCL 200 MG PO TABS: 200.0000 mg | ORAL_TABLET | Freq: Every day | ORAL | 0 refills | Status: AC

## 2019-12-11 MED ORDER — CIPROFLOXACIN HCL 500 MG PO TABS: 500.0000 mg | ORAL_TABLET | Freq: Two times a day (BID) | ORAL | 0 refills | Status: AC

## 2019-12-11 MED ORDER — AMIODARONE HCL 200 MG PO TABS: 200.0000 mg | ORAL_TABLET | Freq: Two times a day (BID) | ORAL | 0 refills | Status: DC

## 2019-12-11 MED ORDER — TORSEMIDE 20 MG PO TABS: 40.0000 mg | ORAL_TABLET | Freq: Every day | ORAL | Status: DC | PRN

## 2019-12-11 MED ORDER — POTASSIUM CHLORIDE CRYS ER 10 MEQ PO TBCR: 10.0000 meq | EXTENDED_RELEASE_TABLET | Freq: Every day | ORAL | 0 refills | Status: DC

## 2019-12-11 MED ORDER — ACETAMINOPHEN 325 MG PO TABS: 650.0000 mg | ORAL_TABLET | Freq: Four times a day (QID) | ORAL | Status: AC | PRN

## 2019-12-11 MED ORDER — TORSEMIDE 20 MG PO TABS: 40.0000 mg | ORAL_TABLET | Freq: Every day | ORAL | Status: DC

## 2019-12-11 NOTE — Consult Note (Signed)
WOC Nurse Consult Note: Reason for Consult: bilateral lower leg wounds and need for compression.  Has wraps in place that were applied yesterday.  Orders entered today for Tuesday and Friday dressing changes while here.  Wound type:venous stasis, infectious Pressure Injury POA: NA Measurement: not assessed today.  Will apply wraps and aquacel Ag tomorrow.  Wound bed: pale pink per photos in record from yesterday.  Drainage (amount, consistency, odor) wraps intact   Periwound:intact Dressing procedure/placement/frequency: Cleanse legs with soap and water and pat dry.  APply Aquacel Ag to open wounds.  Wrap from below toes to below knee with kerlix and secure with self adherent Coban.  Change on Tuesday and Friday.  Will follow.  Maple Hudson MSN, RN, FNP-BC [facility] Wound, Ostomy, Continence Nurse Pager [phone]

## 2019-12-11 NOTE — Discharge Summary (Signed)
Physician Discharge Summary  [facility] ZOX:[id number] DOB: 08/27/1965 DOA: 11/24/2019  PCP: Patient, No Pcp Per  Admit date: 11/24/2019 Discharge date: 12/11/2019  Discharge disposition: Home with home health therapy   Recommendations for Outpatient Follow-Up:   1. Follow-up with PCP in 1 week 2. Follow-up at CHF clinic   Discharge Diagnosis:   Principal Problem:   Streptococcal sepsis, unspecified (HCC) Active Problems:   Cellulitis of left leg   AKI (acute kidney injury) (HCC)   Bacteremia   Lower extremity edema   Morbid obesity (HCC)   Tobacco abuse   Paroxysmal atrial fibrillation (HCC)   Acute on chronic heart failure with preserved ejection fraction (HFpEF) (HCC)   Atrial flutter (HCC)   Heart failure with preserved ejection fraction (HCC)    Discharge Condition: Stable.  Diet recommendation:  Diet Order            Diet - low sodium heart healthy           Diet heart healthy/carb modified Room service appropriate? Yes; Fluid consistency: Thin  Diet effective now                   Code Status: Full Code     Hospital Course:   Mr. Orman Junior Rogersis a 54 y.o.malewithhistory of morbid obesity, tobacco use disorder, hepatitis C, chronic lower extremity venous stasis for the past 15 years but otherwise no medical history and on no medicationswho presented to the ED on 11/24/19 with 2-week history ofprogressively worsening bilateral lower extremityswelling, left greater than right associated withpain and skin breakdown withoozing.  He was found to have severe sepsis secondary to left lower extremity cellulitis.  He was treated with empiric IV antibiotics and IV fluids.  Blood culture was positive for group B strep consistent with group B strep bacteremia.  He has venous stasis and bilateral lower leg wounds.  Wound culture from the left leg wound showed Pseudomonas aeruginosa.  MRI of the left lower extremity was obtained because of  persistent pain in the left lower extremity.  There was concern for fluid collection around the left knee and the left foot.  Orthopedic surgeon and podiatrist were consulted to evaluate the left knee and left foot respectively.  Conservative management with antibiotics was recommended.  He also had AKI likely from sepsis which improved with IV fluids.  He had significantly elevated D-dimer.  Venous duplex of the lower extremities was negative for DVT.  CTA of the chest was negative for acute pulmonary embolism.  He complained of diarrhea which he said started prior to admission.  Stool for C. difficile toxin was negative.  Hospitalization was complicated by atrial flutter with rapid ventricular response which was difficult to control.  He was treated with IV Cardizem infusion and IV amiodarone infusion.  He also developed acute heart failure.  2D echo showed preserved ejection fraction of 50 to 55%.  He likely has underlying chronic diastolic CHF.  He was seen in consultation by the cardiologist.  He underwent TEE but there was no evidence of vegetations or infective endocarditis.  ID was consulted to assist with management.  His condition has slowly improved.  Given recent acute problems, patient was strongly advised to follow-up with a primary care physician for routine health maintenance and to coordinate outpatient follow-up with various specialists.    Medical Consultants:    Infectious disease  Cardiologist  Orthopedic surgery  Podiatrist   Discharge Exam:    Vitals:  12/10/19 2201 12/11/19 0523 12/11/19 0828 12/11/19 1136  BP: 137/73 127/75 136/85 117/75  Pulse: 75 79 70 76  Resp:   20 19  Temp: 97.8 F (36.6 C) 98.4 F (36.9 C) 98.4 F (36.9 C) 98.3 F (36.8 C)  TempSrc: Oral Oral Oral Oral  SpO2: 96% 97% 95% 97%  Weight:      Height:         GEN: NAD SKIN: Warm and dry.  Chronic hyperpigmentation of bilateral legs and multiple chronic wounds on bilateral  legs. EYES: No pallor or icterus ENT: MMM CV: RRR PULM: CTA B ABD: soft, obese, NT, +BS CNS: AAO x 3, non focal EXT: Left foot and leg tenderness with mild swelling.   The results of significant diagnostics from this hospitalization (including imaging, microbiology, ancillary and laboratory) are listed below for reference.     Procedures and Diagnostic Studies:   CT TIBIA FIBULA LEFT W CONTRAST  Result Date: 12/07/2019 CLINICAL DATA:  Bilateral lower extremity edema, question abscess EXAM: CT OF THE LOWER RIGHT EXTREMITY WITH CONTRAST TECHNIQUE: Multidetector CT imaging of the lower right extremity was performed according to the standard protocol following intravenous contrast administration. COMPARISON:  None. CONTRAST:  OMNIPAQUE IOHEXOL 300 MG/ML  SOLN FINDINGS: Bones/Joint/Cartilage No fracture or dislocation. Mild femoroacetabular and medial compartment osteoarthritis with joint space loss and marginal osteophyte formation. There is enthesophytes seen at the medial femoral condyle, likely from prior ligamentous injury. Ligaments Suboptimally assessed by CT. Muscles and Tendons The muscles surrounding the lower extremity intact without focal atrophy or tear. The tendons surrounding the lower extremity appear to be intact. Soft tissues There is diffuse skin thickening and subcutaneous edema seen surrounding the extremity. Loculated seen the anterolateral aspect of the distal femur and knee. There is also diffuse subcutaneous edema seen around the ankle with areas of induration the medial portion the ankle. Along the medial portion the ankle there is a focal area possible ulceration and a loculated collection measuring 1.7 x 0.7 x 5.2 cm and extends along the anterior distal tibia. There is small soft tissue calcifications seen along the anterior tibia. There is also ossification along the posterior proximal fibula likely from prior injury. IMPRESSION: 1. Findings suggestive of diffuse  cellulitis involving the lower extremity with subcutaneous edema/phlegmon surrounding the distal femur and knee. 2. There is also induration and a probable area of ulceration seen along the medial aspect of the ankle with a small loculated fluid collection measuring 1.7 x 0.7 x 5.2 cm as described above. 3. Scattered areas of soft tissue calcifications. Electronically Signed   By: Jonna Clark M.D.   On: 12/07/2019 20:29   CT FEMUR LEFT W CONTRAST  Result Date: 12/07/2019 CLINICAL DATA:  Bilateral lower extremity edema, question abscess EXAM: CT OF THE LOWER RIGHT EXTREMITY WITH CONTRAST TECHNIQUE: Multidetector CT imaging of the lower right extremity was performed according to the standard protocol following intravenous contrast administration. COMPARISON:  None. CONTRAST:  OMNIPAQUE IOHEXOL 300 MG/ML  SOLN FINDINGS: Bones/Joint/Cartilage No fracture or dislocation. Mild femoroacetabular and medial compartment osteoarthritis with joint space loss and marginal osteophyte formation. There is enthesophytes seen at the medial femoral condyle, likely from prior ligamentous injury. Ligaments Suboptimally assessed by CT. Muscles and Tendons The muscles surrounding the lower extremity intact without focal atrophy or tear. The tendons surrounding the lower extremity appear to be intact. Soft tissues There is diffuse skin thickening and subcutaneous edema seen surrounding the extremity. Loculated seen the anterolateral aspect of  the distal femur and knee. There is also diffuse subcutaneous edema seen around the ankle with areas of induration the medial portion the ankle. Along the medial portion the ankle there is a focal area possible ulceration and a loculated collection measuring 1.7 x 0.7 x 5.2 cm and extends along the anterior distal tibia. There is small soft tissue calcifications seen along the anterior tibia. There is also ossification along the posterior proximal fibula likely from prior injury. IMPRESSION:  1. Findings suggestive of diffuse cellulitis involving the lower extremity with subcutaneous edema/phlegmon surrounding the distal femur and knee. 2. There is also induration and a probable area of ulceration seen along the medial aspect of the ankle with a small loculated fluid collection measuring 1.7 x 0.7 x 5.2 cm as described above. 3. Scattered areas of soft tissue calcifications. Electronically Signed   By: Jonna Clark M.D.   On: 12/07/2019 20:29   CT FOOT LEFT W CONTRAST  Result Date: 12/07/2019 CLINICAL DATA:  Bilateral lower extremity edema, question abscess EXAM: CT OF THE LOWER RIGHT EXTREMITY WITH CONTRAST TECHNIQUE: Multidetector CT imaging of the lower right extremity was performed according to the standard protocol following intravenous contrast administration. COMPARISON:  None. CONTRAST:  OMNIPAQUE IOHEXOL 300 MG/ML  SOLN FINDINGS: Bones/Joint/Cartilage No fracture or dislocation. Mild femoroacetabular and medial compartment osteoarthritis with joint space loss and marginal osteophyte formation. There is enthesophytes seen at the medial femoral condyle, likely from prior ligamentous injury. Ligaments Suboptimally assessed by CT. Muscles and Tendons The muscles surrounding the lower extremity intact without focal atrophy or tear. The tendons surrounding the lower extremity appear to be intact. Soft tissues There is diffuse skin thickening and subcutaneous edema seen surrounding the extremity. Loculated seen the anterolateral aspect of the distal femur and knee. There is also diffuse subcutaneous edema seen around the ankle with areas of induration the medial portion the ankle. Along the medial portion the ankle there is a focal area possible ulceration and a loculated collection measuring 1.7 x 0.7 x 5.2 cm and extends along the anterior distal tibia. There is small soft tissue calcifications seen along the anterior tibia. There is also ossification along the posterior proximal fibula  likely from prior injury. IMPRESSION: 1. Findings suggestive of diffuse cellulitis involving the lower extremity with subcutaneous edema/phlegmon surrounding the distal femur and knee. 2. There is also induration and a probable area of ulceration seen along the medial aspect of the ankle with a small loculated fluid collection measuring 1.7 x 0.7 x 5.2 cm as described above. 3. Scattered areas of soft tissue calcifications. Electronically Signed   By: Jonna Clark M.D.   On: 12/07/2019 20:29   ECHO TEE  Result Date: 12/07/2019    TRANSESOPHOGEAL ECHO REPORT   Patient Name:   LARREN COPES Date of Exam: 12/07/2019 Medical Rec #:  [id number]            Height:       79.0 in Accession #:    [id number]           Weight:       396.8 lb Date of Birth:  1965-10-29            BSA:          3.049 m Patient Age:    54 years             BP:           114/60 mmHg Patient Gender: M  HR:           62 bpm. Exam Location:  [facility] Procedure: Transesophageal Echo, Color Doppler, Cardiac Doppler and Saline            Contrast Bubble Study Indications:     R78.81 Bacteremia  History:         Patient has prior history of Echocardiogram examinations, most                  recent 11/27/2019.  Sonographer:     Humphrey Rolls RDCS (AE) Referring Phys:  [id number] Sondra Barges Diagnosing Phys: Julien Nordmann MD PROCEDURE: After discussion of the risks and benefits of a TEE, an informed consent was obtained from the patient. TEE procedure time was 40 minutes. The transesophogeal probe was passed without difficulty through the esophogus of the patient. Imaged were obtained with the patient in a left lateral decubitus position. Local oropharyngeal anesthetic was provided with Cetacaine and Benzocaine spray. Sedation performed by performing physician. Image quality was excellent. The patient's vital signs; including heart rate, blood pressure, and oxygen saturation; remained stable throughout the procedure. The patient developed no  complications during the procedure. IMPRESSIONS  1. Left ventricular ejection fraction, by estimation, is 55 . The left ventricle has normal function. The left ventricle has no regional wall motion abnormalities.  2. Right ventricular systolic function is normal. The right ventricular size is normal.  3. Left atrial size was mildly dilated. No left atrial/left atrial appendage thrombus was detected.  4. No valve vegetation noted.  5. Picc line catheter noted in the right atrium. Conclusion(s)/Recommendation(s): Normal biventricular function without evidence of hemodynamically significant valvular heart disease. FINDINGS  Left Ventricle: Left ventricular ejection fraction, by estimation, is 55 to 60%. The left ventricle has normal function. The left ventricle has no regional wall motion abnormalities. The left ventricular internal cavity size was normal in size. There is  no left ventricular hypertrophy. Right Ventricle: The right ventricular size is normal. No increase in right ventricular wall thickness. Right ventricular systolic function is normal. Left Atrium: Left atrial size was mildly dilated. No left atrial/left atrial appendage thrombus was detected. Right Atrium: Right atrial size was normal in size. Pericardium: There is no evidence of pericardial effusion. Mitral Valve: The mitral valve is normal in structure. Normal mobility of the mitral valve leaflets. Trivial mitral valve regurgitation. No evidence of mitral valve stenosis. Tricuspid Valve: The tricuspid valve is normal in structure. Tricuspid valve regurgitation is not demonstrated. No evidence of tricuspid stenosis. Aortic Valve: The aortic valve is normal in structure. Aortic valve regurgitation is not visualized. No aortic stenosis is present. Pulmonic Valve: The pulmonic valve was normal in structure. Pulmonic valve regurgitation is not visualized. No evidence of pulmonic stenosis. Aorta: The aortic root is normal in size and structure. Venous:  The inferior vena cava is normal in size with greater than 50% respiratory variability, suggesting right atrial pressure of 3 mmHg. IAS/Shunts: No atrial level shunt detected by color flow Doppler. Agitated saline contrast was given intravenously to evaluate for intracardiac shunting. No ventricular septal defect is seen or detected. There is no evidence of an atrial septal defect. Julien Nordmann MD Electronically signed by Julien Nordmann MD Signature Date/Time: 12/07/2019/1:52:09 PM    Final      Labs:   Basic Metabolic Panel: Recent Labs  Lab 12/05/19 0407 12/05/19 0407 12/06/19 0500 12/06/19 0500 12/07/19 2831 12/07/19 5176 12/08/19 0035 12/11/19 1108  NA 135  --  132*  --  134*  --  134* 135  K 4.4   < > 4.2   < > 4.4   < > 4.4 3.9  CL 96*  --  95*  --  96*  --  98 91*  CO2 28  --  28  --  28  --  26 27  GLUCOSE 110*  --  115*  --  104*  --  105* 147*  BUN 17  --  18  --  17  --  18 29*  CREATININE 0.87  --  1.01  --  1.06  --  1.08 1.27*  CALCIUM 8.3*  --  8.6*  --  8.7*  --  8.9 9.3  MG  --   --   --   --   --   --  2.3  --    < > = values in this interval not displayed.   GFR Estimated Creatinine Clearance: 116.7 mL/min (A) (by C-G formula based on SCr of 1.27 mg/dL (H)). Liver Function Tests: No results for input(s): AST, ALT, ALKPHOS, BILITOT, PROT, ALBUMIN in the last 168 hours. No results for input(s): LIPASE, AMYLASE in the last 168 hours. No results for input(s): AMMONIA in the last 168 hours. Coagulation profile No results for input(s): INR, PROTIME in the last 168 hours.  CBC: Recent Labs  Lab 12/05/19 0407 12/06/19 0500 12/07/19 0614 12/09/19 0500  WBC 11.3* 8.8 7.4 6.7  NEUTROABS 8.7* 6.6  --   --   HGB 9.5* 9.1* 9.4* 9.8*  HCT 30.2* 29.2* 30.7* 31.1*  MCV 67.9* 68.2* 70.6* 68.2*  PLT 322 278 312 310   Cardiac Enzymes: No results for input(s): CKTOTAL, CKMB, CKMBINDEX, TROPONINI in the last 168 hours. BNP: Invalid input(s): POCBNP CBG: No results  for input(s): GLUCAP in the last 168 hours. D-Dimer No results for input(s): DDIMER in the last 72 hours. Hgb A1c No results for input(s): HGBA1C in the last 72 hours. Lipid Profile No results for input(s): CHOL, HDL, LDLCALC, TRIG, CHOLHDL, LDLDIRECT in the last 72 hours. Thyroid function studies No results for input(s): TSH, T4TOTAL, T3FREE, THYROIDAB in the last 72 hours.  Invalid input(s): FREET3 Anemia work up No results for input(s): VITAMINB12, FOLATE, FERRITIN, TIBC, IRON, RETICCTPCT in the last 72 hours. Microbiology No results found for this or any previous visit (from the past 240 hour(s)).   Discharge Instructions:   Discharge Instructions    AMB referral to CHF clinic   Complete by: As directed    Diet - low sodium heart healthy   Complete by: As directed    Discharge wound care:   Complete by: As directed    Wash bilateral legs with soap and water.  Rinse and pat dry.  Cover lesions with Aquacel AG top with ABD pad.  Secure by wrapping from just below toes to just below knee with Kerlix roll gauze.  Top with self adherent Coban   Face-to-face encounter (required for Medicare/Medicaid patients)   Complete by: As directed    I Xaiden Fleig certify that this patient is under my care and that I, or a nurse practitioner or physician's assistant working with me, had a face-to-face encounter that meets the physician face-to-face encounter requirements with this patient on 12/11/2019. The encounter with the patient was in whole, or in part for the following medical condition(s) which is the primary reason for home health care (List medical condition): CHF, left leg cellulitis   The encounter with the patient was  in whole, or in part, for the following medical condition, which is the primary reason for home health care: CHF, left leg cellulitis, leg wounds   I certify that, based on my findings, the following services are medically necessary home health services:  Physical  therapy Nursing     Reason for Medically Necessary Home Health Services:  Therapy- Investment banker, operationalGait Training, Patent examinerTransfer Training and Stair Training Skilled Nursing- Complex Wound Care     My clinical findings support the need for the above services: Unable to leave home safely without assistance and/or assistive device   Further, I certify that my clinical findings support that this patient is homebound due to: Unable to leave home safely without assistance   Home Health   Complete by: As directed    To provide the following care/treatments:  PT OT RN     Increase activity slowly   Complete by: As directed      Allergies as of 12/11/2019   No Known Allergies     Medication List    STOP taking these medications   lisinopril 20 MG tablet Commonly known as: ZESTRIL     TAKE these medications   acetaminophen 325 MG tablet Commonly known as: TYLENOL Take 2 tablets (650 mg total) by mouth every 6 (six) hours as needed for mild pain. What changed:   how much to take  when to take this  reasons to take this   amiodarone 200 MG tablet Commonly known as: PACERONE Take 1 tablet (200 mg total) by mouth 2 (two) times daily for 3 doses.   amiodarone 200 MG tablet Commonly known as: PACERONE Take 1 tablet (200 mg total) by mouth daily. Start taking on: December 13, 2019   apixaban 5 MG Tabs tablet Commonly known as: ELIQUIS Take 1 tablet (5 mg total) by mouth 2 (two) times daily.   ciprofloxacin 500 MG tablet Commonly known as: CIPRO Take 1 tablet (500 mg total) by mouth 2 (two) times daily for 5 days.   potassium chloride 10 MEQ tablet Commonly known as: KLOR-CON Take 1 tablet (10 mEq total) by mouth daily. Start taking on: December 12, 2019   torsemide 20 MG tablet Commonly known as: Demadex Take 2 tablets (40 mg total) by mouth daily as needed (Take 2 tablets in the evening as needed for leg swelling or weight gain of more than 3 pounds).   torsemide 20 MG tablet Commonly  known as: DEMADEX Take 2 tablets (40 mg total) by mouth daily. Start taking on: December 13, 2019            Durable Medical Equipment  (From admission, onward)         Start     Ordered   12/08/19 1036  For home use only DME Walker rolling  Once       Comments: BARIATRIC  Question Answer Comment  Walker: With 5 Inch Wheels   Patient needs a walker to treat with the following condition Weakness      12/08/19 1036           Discharge Care Instructions  (From admission, onward)         Start     Ordered   12/11/19 0000  Discharge wound care:       Comments: Wash bilateral legs with soap and water.  Rinse and pat dry.  Cover lesions with Aquacel AG top with ABD pad.  Secure by wrapping from just below toes to just below knee with  Kerlix roll gauze.  Top with self adherent Coban   12/11/19 1440          Follow-up Information    [facility] Follow up on 12/22/2019.   Specialty: Cardiology Why:  at 9:00am. Enter through the Medical Mall entrance Contact information: [address] [place] [address] [phone]       [facility]. Schedule an appointment as soon as possible for a visit in 1 week(s).   Specialty: Wound Care Contact information: [address] [phone] ar [phone]               Time coordinating discharge: 36 minutes  Signed:  Lurene Shadow  [facility] 12/11/2019, 2:41 PM   Pager on [email or web address]. If 7PM-7AM, please contact night-coverage at [email or web address]

## 2019-12-11 NOTE — TOC Progression Note (Signed)
Transition of Care [facility]) - Progression Note    Patient Details  Name: Alfred [name] MRN: [id number] Date of Birth: May 28, 1965  Transition of Care [facility]) CM/SW Contact  Eileen Stanford, LCSW Phone Number: 12/11/2019, 3:33 PM  Clinical Narrative:   CSW met with pt at bedside. CSW assisted pt in filling out Application for Financial Assistance for [employer] in order for pt to receive the walker. The walker is already present at bedside. Pt will take walker home. CSW emailed application to appropriate email. CSW sent referral to [facility] in order for pt to get a established PCP. They will follow up with pt. A barrier for the pt is transport. Pt states he has no way to get him meds tomorrow and med management is closed today. RN notified and MD notified. Taxi form completed and placed on pt's chart in the instance that pt d/c today. Pt states he can take a taxi home if d/c.    Expected Discharge Plan: [place] Barriers to Discharge: Continued Medical Work up  Expected Discharge Plan and Services Expected Discharge Plan: [place]   Discharge Planning Services: CM Consult Post Acute Care Choice: NA Living arrangements for the past 2 months: Single Family Home Expected Discharge Date: 12/11/19               DME Arranged: Gilford Rile rolling (Bariatric) DME Agency: [employer] Date DME Agency Contacted: 12/08/19 Time DME Agency Contacted: [phone] Representative spoke with at DME Agency: [place] Determinants of Health ([place]) Interventions    Readmission Risk Interventions No flowsheet data found.

## 2019-12-11 NOTE — Progress Notes (Signed)
Alcario Drought RN aware of order to d/c PICC line.

## 2019-12-11 NOTE — Progress Notes (Signed)
Pt refused dressing change onpt  legs this morning but was educated about the importance of the dressing change. Will notify incoming shift. Will continue to monitor,

## 2019-12-11 NOTE — Plan of Care (Signed)
  Problem: Clinical Measurements: Goal: Will remain free from infection Outcome: Progressing Goal: Cardiovascular complication will be avoided Outcome: Progressing   Problem: Elimination: Goal: Will not experience complications related to urinary retention Outcome: Progressing   Problem: Pain Managment: Goal: General experience of comfort will improve Outcome: Progressing   Problem: Safety: Goal: Ability to remain free from injury will improve Outcome: Progressing

## 2019-12-11 NOTE — Progress Notes (Signed)
Maisie Fus Junior Luedke to be D/C'd Home per MD order.  Discussed prescriptions and follow up appointments with the patient. Prescriptions were sent to med management clinic. Pt states he has no way to get him meds tomorrow and med management is closed today.  MD notified. SW printed bus information which is free for patient to use to pick up his medications tomorrow. Pt states he can take a taxi home if d/c.  Taxi boucher given by SW. Pt verbalized understanding.  Allergies as of 12/11/2019   No Known Allergies     Medication List    STOP taking these medications   lisinopril 20 MG tablet Commonly known as: ZESTRIL     TAKE these medications   acetaminophen 325 MG tablet Commonly known as: TYLENOL Take 2 tablets (650 mg total) by mouth every 6 (six) hours as needed for mild pain. What changed:   how much to take  when to take this  reasons to take this   amiodarone 200 MG tablet Commonly known as: PACERONE Take 1 tablet (200 mg total) by mouth 2 (two) times daily for 3 doses.   amiodarone 200 MG tablet Commonly known as: PACERONE Take 1 tablet (200 mg total) by mouth daily. Start taking on: December 13, 2019   apixaban 5 MG Tabs tablet Commonly known as: ELIQUIS Take 1 tablet (5 mg total) by mouth 2 (two) times daily.   ciprofloxacin 500 MG tablet Commonly known as: CIPRO Take 1 tablet (500 mg total) by mouth 2 (two) times daily for 5 days.   potassium chloride 10 MEQ tablet Commonly known as: KLOR-CON Take 1 tablet (10 mEq total) by mouth daily. Start taking on: December 12, 2019   torsemide 20 MG tablet Commonly known as: Demadex Take 2 tablets (40 mg total) by mouth daily as needed (Take 2 tablets in the evening as needed for leg swelling or weight gain of more than 3 pounds).   torsemide 20 MG tablet Commonly known as: DEMADEX Take 2 tablets (40 mg total) by mouth daily. Start taking on: December 13, 2019            Durable Medical Equipment  (From  admission, onward)         Start     Ordered   12/08/19 1036  For home use only DME Walker rolling  Once       Comments: BARIATRIC  Question Answer Comment  Walker: With 5 Inch Wheels   Patient needs a walker to treat with the following condition Weakness      12/08/19 1036           Discharge Care Instructions  (From admission, onward)         Start     Ordered   12/11/19 0000  Discharge wound care:       Comments: Wash bilateral legs with soap and water.  Rinse and pat dry.  Cover lesions with Aquacel AG top with ABD pad.  Secure by wrapping from just below toes to just below knee with Kerlix roll gauze.  Top with self adherent Coban   12/11/19 1440          Vitals:   12/11/19 1136 12/11/19 1646  BP: 117/75 120/69  Pulse: 76 79  Resp: 19 20  Temp: 98.3 F (36.8 C) 97.8 F (36.6 C)  SpO2: 97% 95%    Tele box and IV removed. Discharge instructions given to the patient. Walker and scale sent with patient. Patient escorted  via WC, and D/C home via [employer] taxi.  Rigoberto Noel

## 2019-12-11 NOTE — Progress Notes (Addendum)
Progress Note  Patient Name: Alfred [name] Date of Encounter: 12/11/2019  [facility] Cardiologist: [name], MD   Subjective   Continue diuresis on torsemide 40 twice daily Left leg less tender, still with bilateral leg wraps Abdomen less distended, less tight -4 L yesterday, more than -42 L this admission  Inpatient Medications    Scheduled Meds: . amiodarone  200 mg Oral BID  . [START ON 12/13/2019] amiodarone  200 mg Oral Daily  . apixaban  5 mg Oral BID  . Chlorhexidine Gluconate Cloth  6 each Topical Daily  . ciprofloxacin  500 mg Oral BID  . diclofenac Sodium  2 g Topical QID  . polyethylene glycol  17 g Oral Daily  . potassium chloride  20 mEq Oral Daily  . senna  2 tablet Oral QHS  . sodium chloride flush  10-40 mL Intracatheter Q12H  . torsemide  40 mg Oral BID   Continuous Infusions: . sodium chloride 10 mL (12/11/19 0019)  . ampicillin-sulbactam (UNASYN) IV 3 g (12/11/19 1237)  . sodium chloride     PRN Meds: sodium chloride, acetaminophen **OR** acetaminophen, albuterol, bisacodyl, guaiFENesin, HYDROcodone-acetaminophen, ipratropium-albuterol, melatonin, morphine injection, ondansetron **OR** ondansetron (ZOFRAN) IV, sodium chloride flush   Vital Signs    Vitals:   12/10/19 2201 12/11/19 0523 12/11/19 0828 12/11/19 1136  BP: 137/73 127/75 136/85 117/75  Pulse: 75 79 70 76  Resp:   20 19  Temp: 97.8 F (36.6 C) 98.4 F (36.9 C) 98.4 F (36.9 C) 98.3 F (36.8 C)  TempSrc: Oral Oral Oral Oral  SpO2: 96% 97% 95% 97%  Weight:      Height:        Intake/Output Summary (Last 24 hours) at 12/11/2019 1332 Last data filed at 12/11/2019 1135 Gross per 24 hour  Intake 326.92 ml  Output 3925 ml  Net -3598.08 ml   Last 3 Weights 12/10/2019 12/09/2019 12/08/2019  Weight (lbs) 366 lb 11.2 oz 374 lb 11.2 oz 378 lb 8.5 oz  Weight (kg) 166.334 kg 169.963 kg 171.7 kg      Telemetry    Normal sinus rhythm- Personally Reviewed  ECG     -  Personally Reviewed  Physical Exam   Constitutional:  oriented to person, place, and time. No distress.  HENT:  Head: Grossly normal Eyes:  no discharge. No scleral icterus.  Neck: No JVD, no carotid bruits  Cardiovascular: Regular rate and rhythm, no murmurs appreciated Pulmonary/Chest: Clear to auscultation bilaterally, no wheezes or rails Abdominal: Soft.  no distension.  no tenderness.  Musculoskeletal: Normal range of motion Neurological:  normal muscle tone. Coordination normal. No atrophy Skin: Bilateral leg wraps in place Psychiatric: normal affect, pleasant   Labs    High Sensitivity Troponin:   Recent Labs  Lab 11/26/19 1811 11/26/19 2000  TROPONINIHS 27* 27*      Chemistry Recent Labs  Lab 12/07/19 0614 12/08/19 0035 12/11/19 1108  NA 134* 134* 135  K 4.4 4.4 3.9  CL 96* 98 91*  CO2 28 26 27   GLUCOSE 104* 105* 147*  BUN 17 18 29*  CREATININE 1.06 1.08 1.27*  CALCIUM 8.7* 8.9 9.3  GFRNONAA >60 >60 >60  GFRAA >60 >60 >60  ANIONGAP 10 10 17*     Hematology Recent Labs  Lab 12/06/19 0500 12/07/19 0614 12/09/19 0500  WBC 8.8 7.4 6.7  RBC 4.28 4.35 4.56  HGB 9.1* 9.4* 9.8*  HCT 29.2* 30.7* 31.1*  MCV 68.2* 70.6* 68.2*  MCH  21.3* 21.6* 21.5*  MCHC 31.2 30.6 31.5  RDW 16.8* 17.3* 17.2*  PLT 278 312 310    BNPNo results for input(s): BNP, PROBNP in the last 168 hours.   DDimer No results for input(s): DDIMER in the last 168 hours.   Radiology    MR FOOT LEFT W WO CONTRAST  Result Date: 12/09/2019 CLINICAL DATA:  Painful ankle and foot question infection EXAM: MRI OF THE LEFT ANKLE WITHOUT AND WITH CONTRAST; MRI OF THE LEFT FOREFOOT WITHOUT AND WITH CONTRAST TECHNIQUE: Multiplanar, multisequence MR imaging of the ankle was performed before and after the administration of intravenous contrast. CONTRAST:  82mL GADAVIST GADOBUTROL 1 MMOL/ML IV SOLN COMPARISON:  None. FINDINGS: Bones/Joint/Cartilage Normal osseous marrow signal seen throughout. No  areas destruction, periosteal reaction, or abnormal enhancement. The articular surfaces appear to be maintained. No large joint effusion is seen. Calcaneal enthesophyte seen on the plantar surface. Ligaments The Lisfranc ligaments are intact. Muscles and Tendons The muscles surrounding the ankle are intact without focal atrophy or tear. The muscles in the forefoot there is diffusely increased signal with atrophy likely from chronic denervation changes. The flexor and extensor tendons appear to be intact. There is mildly increased signal seen at the insertion site of the Achilles tendon, however it is intact. Soft tissue Along the medial aspect of the ankle there is a area of possible superficial ulceration with a tiny loculated collection measuring 1.9 x 0.8 x 5.1 cm and overlying subcutaneous edema. The collection appears to extend overlying the medial distal tibia to the level the mid talus. IMPRESSION: IMPRESSION Superficial area of ulceration overlying the medial aspect of the distal tibia and ankle with a small multilocular subcutaneous collection measuring 1.9 x 0.8 x 5.1 cm, likely small subcutaneous abscess. No evidence of osteomyelitis. Electronically Signed   By: Jonna Clark M.D.   On: 12/09/2019 19:59   MR ANKLE LEFT W WO CONTRAST  Result Date: 12/09/2019 CLINICAL DATA:  Painful ankle and foot question infection EXAM: MRI OF THE LEFT ANKLE WITHOUT AND WITH CONTRAST; MRI OF THE LEFT FOREFOOT WITHOUT AND WITH CONTRAST TECHNIQUE: Multiplanar, multisequence MR imaging of the ankle was performed before and after the administration of intravenous contrast. CONTRAST:  11mL GADAVIST GADOBUTROL 1 MMOL/ML IV SOLN COMPARISON:  None. FINDINGS: Bones/Joint/Cartilage Normal osseous marrow signal seen throughout. No areas destruction, periosteal reaction, or abnormal enhancement. The articular surfaces appear to be maintained. No large joint effusion is seen. Calcaneal enthesophyte seen on the plantar surface.  Ligaments The Lisfranc ligaments are intact. Muscles and Tendons The muscles surrounding the ankle are intact without focal atrophy or tear. The muscles in the forefoot there is diffusely increased signal with atrophy likely from chronic denervation changes. The flexor and extensor tendons appear to be intact. There is mildly increased signal seen at the insertion site of the Achilles tendon, however it is intact. Soft tissue Along the medial aspect of the ankle there is a area of possible superficial ulceration with a tiny loculated collection measuring 1.9 x 0.8 x 5.1 cm and overlying subcutaneous edema. The collection appears to extend overlying the medial distal tibia to the level the mid talus. IMPRESSION: IMPRESSION Superficial area of ulceration overlying the medial aspect of the distal tibia and ankle with a small multilocular subcutaneous collection measuring 1.9 x 0.8 x 5.1 cm, likely small subcutaneous abscess. No evidence of osteomyelitis. Electronically Signed   By: Jonna Clark M.D.   On: 12/09/2019 19:59    Cardiac Studies  Echo  1. Left ventricular ejection fraction, by estimation, is 50 to 55%. The  left ventricle has low normal function. Left ventricular endocardial  border not optimally defined to evaluate regional wall motion. The left  ventricular internal cavity size was  mildly dilated. There is moderate left ventricular hypertrophy. Left  ventricular diastolic function could not be evaluated.  2. Right ventricular systolic function is normal. The right ventricular  size is normal. Tricuspid regurgitation signal is inadequate for assessing  PA pressure.    Patient Profile     54 y.o. male with history of morbid obesity, tobacco abuse, admitted due to edema found to have atrial flutter on 11/2019.  Spontaneously converted to sinus rhythm on 11/2022.  Massive diastolic CHF, anasarca/massive leg swelling  Assessment & Plan    Atrial flutter, typical Possibly contributing  to heart failure presentation Converted to normal sinus rhythm August 24, Has been maintaining normal sinus rhythm --- On Eliquis 5 twice daily --Transition to 200 mg daily on September 8 Currently on twice daily dosing  Acute on chronic diastolic CHF In the setting of arrhythmia, morbid obesity, suspected sleep apnea Presenting with massive edema, --42 L Worsening renal function concerning for prerenal state -We will hold diuretic this evening and tomorrow, -Then placed back on torsemide 40 mg daily starting September 8 Extra torsemide 40 in the afternoon for 3 pound weight gain  Streptococcus group B bacteremia On broad-spectrum antibiotics, followed by ID  transesophageal echo with no endocarditis/valve vegetation On Unasyn  Left leg pain small fluid collection at the medial aspect of the left ankle with potential open ulceration present to this area.  The abscess to this area or fluid collection appears to be very small. On broad-spectrum antibiotics   Total encounter time more than 35 minutes  Greater than 50% was spent in counseling and coordination of care with the patient   For questions or updates, please contact [facility] Please consult [email or web address] for contact info under    Signed, Julien Nordmann, MD  12/11/2019, 1:32 PM

## 2019-12-14 ENCOUNTER — Telehealth: Payer: Self-pay | Admitting: Gerontology

## 2019-12-14 NOTE — Telephone Encounter (Signed)
Called pt @1 :56 pm on 12/14/19. Phone number was not in service.02/13/20

## 2019-12-20 ENCOUNTER — Telehealth: Payer: Self-pay | Admitting: Family

## 2019-12-20 NOTE — Progress Notes (Deleted)
   Patient ID: Arlee Muslim, male    DOB: March 08, 1966, 54 y.o.   MRN: [id number]  HPI  Mr Baines is a 54 y/o male with a history of  Echo report from 12/07/19 reviewed and showed an EF of 55% along without structural changes.    Admitted 11/24/19 due to lower extremity swelling. Found to have severe sepsis due to lower leg cellulitis. Cardiology, ID, podiatry and wound consult obtained. Given IV antibiotics and IVF. Transitioned to oral medications. Lower extremity leg negative for DVT. Chest CTA negative for PE. Developed atrial flutter with RVR. Given IV cardizem and IV amiodarone. Discharged after 17 days.   He presents today for his initial visit with a chief complaint of  Review of Systems    Physical Exam    Assessment & Plan:  1: Chronic heart failure with preserved ejection fraction without structural changes- - NYHA class  2: Paroxysmal atrial flutter- - BMP 12/11/19 reviewed and showed sodium 135, potassium 3.9, creatinine 1.27 and GFR >60  3: Tobacco use-

## 2019-12-20 NOTE — Telephone Encounter (Signed)
Unable to reach patient. Does not have an accurate number on file to follow up with patient since his hospital discharge and to notify him of his new patient CHF Clinic appointment that was made for 9/17 at 9am.   Weinert, NT

## 2019-12-22 ENCOUNTER — Ambulatory Visit: Payer: Medicaid Other | Admitting: Family

## 2019-12-22 ENCOUNTER — Telehealth: Payer: Self-pay | Admitting: Family

## 2019-12-22 NOTE — Telephone Encounter (Signed)
Patient did not show for his Heart Failure Clinic appointment on 12/22/19. Will attempt to reschedule.

## 2020-01-31 ENCOUNTER — Telehealth: Payer: Self-pay | Admitting: General Practice

## 2020-01-31 NOTE — Telephone Encounter (Signed)
Tried calling pt's contact but was unable to get through. Pt needs information on eligibility/ppw to become a pt.   Thanks.   MD 10/27 @ 3:51

## 2020-02-28 ENCOUNTER — Telehealth: Payer: Self-pay | Admitting: Pharmacist

## 2020-02-28 NOTE — Telephone Encounter (Signed)
Prescriptions were filled but never picked up by the patient. No additional medication assistance will be provided by [facility] without the required 2021 financial documentation.  Patient is homeless and cannot be notified by letter. Marquette Saa Administrative Assistant [facility]

## 2021-07-09 ENCOUNTER — Other Ambulatory Visit: Payer: Self-pay

## 2021-07-09 ENCOUNTER — Emergency Department: Payer: Medicaid Other

## 2021-07-09 ENCOUNTER — Inpatient Hospital Stay
Admission: EM | Admit: 2021-07-09 | Discharge: 2021-07-15 | DRG: 571 | Disposition: A | Discharge status: Home Health | Payer: Medicaid Other | Attending: Internal Medicine | Admitting: Internal Medicine

## 2021-07-09 DIAGNOSIS — L97515 Non-pressure chronic ulcer of other part of right foot with muscle involvement without evidence of necrosis: Secondary | ICD-10-CM | POA: Diagnosis present

## 2021-07-09 DIAGNOSIS — L97509 Non-pressure chronic ulcer of other part of unspecified foot with unspecified severity: Secondary | ICD-10-CM

## 2021-07-09 DIAGNOSIS — I872 Venous insufficiency (chronic) (peripheral): Secondary | ICD-10-CM | POA: Diagnosis present

## 2021-07-09 DIAGNOSIS — D509 Iron deficiency anemia, unspecified: Secondary | ICD-10-CM | POA: Diagnosis present

## 2021-07-09 DIAGNOSIS — L02611 Cutaneous abscess of right foot: Secondary | ICD-10-CM | POA: Diagnosis present

## 2021-07-09 DIAGNOSIS — L03116 Cellulitis of left lower limb: Secondary | ICD-10-CM | POA: Diagnosis present

## 2021-07-09 DIAGNOSIS — Z8249 Family history of ischemic heart disease and other diseases of the circulatory system: Secondary | ICD-10-CM

## 2021-07-09 DIAGNOSIS — I739 Peripheral vascular disease, unspecified: Secondary | ICD-10-CM | POA: Diagnosis present

## 2021-07-09 DIAGNOSIS — Z79899 Other long term (current) drug therapy: Secondary | ICD-10-CM

## 2021-07-09 DIAGNOSIS — R600 Localized edema: Secondary | ICD-10-CM

## 2021-07-09 DIAGNOSIS — L97929 Non-pressure chronic ulcer of unspecified part of left lower leg with unspecified severity: Secondary | ICD-10-CM | POA: Diagnosis present

## 2021-07-09 DIAGNOSIS — L03115 Cellulitis of right lower limb: Secondary | ICD-10-CM | POA: Diagnosis present

## 2021-07-09 DIAGNOSIS — I4892 Unspecified atrial flutter: Secondary | ICD-10-CM | POA: Diagnosis present

## 2021-07-09 DIAGNOSIS — F17210 Nicotine dependence, cigarettes, uncomplicated: Secondary | ICD-10-CM | POA: Diagnosis present

## 2021-07-09 DIAGNOSIS — Z597 Insufficient social insurance and welfare support: Secondary | ICD-10-CM

## 2021-07-09 DIAGNOSIS — R5381 Other malaise: Secondary | ICD-10-CM | POA: Diagnosis present

## 2021-07-09 DIAGNOSIS — I878 Other specified disorders of veins: Secondary | ICD-10-CM | POA: Diagnosis present

## 2021-07-09 DIAGNOSIS — B351 Tinea unguium: Secondary | ICD-10-CM | POA: Diagnosis present

## 2021-07-09 DIAGNOSIS — Z6841 Body Mass Index (BMI) 40.0 and over, adult: Secondary | ICD-10-CM

## 2021-07-09 DIAGNOSIS — Z7901 Long term (current) use of anticoagulants: Secondary | ICD-10-CM

## 2021-07-09 DIAGNOSIS — I890 Lymphedema, not elsewhere classified: Secondary | ICD-10-CM | POA: Diagnosis present

## 2021-07-09 DIAGNOSIS — L97519 Non-pressure chronic ulcer of other part of right foot with unspecified severity: Principal | ICD-10-CM

## 2021-07-09 DIAGNOSIS — L03031 Cellulitis of right toe: Secondary | ICD-10-CM | POA: Diagnosis present

## 2021-07-09 DIAGNOSIS — I5032 Chronic diastolic (congestive) heart failure: Secondary | ICD-10-CM

## 2021-07-09 DIAGNOSIS — D649 Anemia, unspecified: Secondary | ICD-10-CM

## 2021-07-09 LAB — CBC WITH DIFFERENTIAL/PLATELET
Abs Immature Granulocytes: 0.03 10*3/uL (ref 0.00–0.07)
Basophils Absolute: 0 10*3/uL (ref 0.0–0.1)
Basophils Relative: 1 %
Eosinophils Absolute: 0.2 10*3/uL (ref 0.0–0.5)
Eosinophils Relative: 3 %
HCT: 32.5 % — ABNORMAL LOW (ref 39.0–52.0)
Hemoglobin: 9.5 g/dL — ABNORMAL LOW (ref 13.0–17.0)
Immature Granulocytes: 1 %
Lymphocytes Relative: 14 %
Lymphs Abs: 0.9 10*3/uL (ref 0.7–4.0)
MCH: 20.2 pg — ABNORMAL LOW (ref 26.0–34.0)
MCHC: 29.2 g/dL — ABNORMAL LOW (ref 30.0–36.0)
MCV: 69 fL — ABNORMAL LOW (ref 80.0–100.0)
Monocytes Absolute: 0.6 10*3/uL (ref 0.1–1.0)
Monocytes Relative: 9 %
Neutro Abs: 4.8 10*3/uL (ref 1.7–7.7)
Neutrophils Relative %: 72 %
Platelets: 280 10*3/uL (ref 150–400)
RBC: 4.71 MIL/uL (ref 4.22–5.81)
RDW: 19.9 % — ABNORMAL HIGH (ref 11.5–15.5)
WBC: 6.5 10*3/uL (ref 4.0–10.5)
nRBC: 0 % (ref 0.0–0.2)

## 2021-07-09 LAB — BASIC METABOLIC PANEL
Anion gap: 9 (ref 5–15)
BUN: 8 mg/dL (ref 6–20)
CO2: 25 mmol/L (ref 22–32)
Calcium: 8.7 mg/dL — ABNORMAL LOW (ref 8.9–10.3)
Chloride: 100 mmol/L (ref 98–111)
Creatinine, Ser: 0.75 mg/dL (ref 0.61–1.24)
GFR, Estimated: >60 mL/min (ref >60)
Glucose, Bld: 113 mg/dL — ABNORMAL HIGH (ref 70–99)
Potassium: 3.8 mmol/L (ref 3.5–5.1)
Sodium: 134 mmol/L — ABNORMAL LOW (ref 135–145)

## 2021-07-09 MED ORDER — VANCOMYCIN HCL IN DEXTROSE 1-5 GM/200ML-% IV SOLN
1000.0000 mg | Freq: Once | INTRAVENOUS | Status: AC
  Administered 2021-07-09: 1000 mg via INTRAVENOUS
  Filled 2021-07-09: qty 200

## 2021-07-09 MED ORDER — ACETAMINOPHEN 650 MG RE SUPP: 650.0000 mg | Freq: Four times a day (QID) | RECTAL | Status: AC | PRN

## 2021-07-09 MED ORDER — ONDANSETRON HCL 4 MG/2ML IJ SOLN
4.0000 mg | Freq: Four times a day (QID) | INTRAMUSCULAR | Status: DC | PRN
  Administered 2021-07-11: 4 mg via INTRAVENOUS

## 2021-07-09 MED ORDER — HEPARIN SODIUM (PORCINE) 5000 UNIT/ML IJ SOLN: 5000.0000 [IU] | Freq: Three times a day (TID) | INTRAMUSCULAR | Status: DC

## 2021-07-09 MED ORDER — ACETAMINOPHEN 325 MG PO TABS
650.0000 mg | ORAL_TABLET | Freq: Four times a day (QID) | ORAL | Status: AC | PRN
  Administered 2021-07-11 – 2021-07-12 (×3): 650 mg via ORAL
  Filled 2021-07-09 (×3): qty 2

## 2021-07-09 MED ORDER — ONDANSETRON HCL 4 MG PO TABS: 4.0000 mg | ORAL_TABLET | Freq: Four times a day (QID) | ORAL | Status: DC | PRN

## 2021-07-09 NOTE — ED Provider Notes (Signed)
? ?[facility] ?Provider Note ? ? ? Event Date/Time  ? First MD Initiated Contact with Patient 07/09/21 2142   ?  (approximate) ? ? ?History  ? ?Leg Swelling ? ? ?HPI ? ?Alfred [name] is a 56 y.o. male  who, per discharge summary dated 12/11/2019 had admission for cellulitis and noted to have lower extremity edema, chf, who presents to the emergency department today because of concern for right foot infection.  The patient states it started about a week ago.  He does have chronic edema to his legs.  He denies being on any medication regularly for that.  The patient did try wrapping it himself. States he has had infections in his legs in the past. He denies any fevers, nausea or vomiting.  ? ?Physical Exam  ? ?Triage Vital Signs: ?ED Triage Vitals  ?Enc Vitals Group  ?   BP 07/09/21 2132 (!) 161/78  ?   Pulse Rate 07/09/21 2132 91  ?   Resp 07/09/21 2132 15  ?   Temp 07/09/21 2132 97.9 ?F (36.6 ?C)  ?   Temp Source 07/09/21 2132 Oral  ?   SpO2 07/09/21 2132 98 %  ?   Weight 07/09/21 2132 (!) 427 lb 11.1 oz (194 kg)  ?   Height 07/09/21 2132 6\' 7"  (2.007 m)  ?   Head Circumference --   ?   Peak Flow --   ?   Pain Score 07/09/21 2131 6  ? ?Most recent vital signs: ?Vitals:  ? 07/09/21 2132  ?BP: (!) 161/78  ?Pulse: 91  ?Resp: 15  ?Temp: 97.9 ?F (36.6 ?C)  ?SpO2: 98%  ? ? ?General: Awake, no distress.  ?CV:  Good peripheral perfusion.  ?Resp:  Normal effort.  ?Abd:  No distention.  ?MSK:  Right foot with large ulcer in mid dorsal aspect, malodorous. Pus and necrotic tissue. ? ? ?ED Results / Procedures / Treatments  ? ?Labs ?(all labs ordered are listed, but only abnormal results are displayed) ?Labs Reviewed  ?CBC WITH DIFFERENTIAL/PLATELET - Abnormal; Notable for the following components:  ?    Result Value  ? Hemoglobin 9.5 (*)   ? HCT 32.5 (*)   ? MCV 69.0 (*)   ? MCH 20.2 (*)   ? MCHC 29.2 (*)   ? RDW 19.9 (*)   ? All other components within normal limits  ?BASIC METABOLIC PANEL -  Abnormal; Notable for the following components:  ? Sodium 134 (*)   ? Glucose, Bld 113 (*)   ? Calcium 8.7 (*)   ? All other components within normal limits  ?CULTURE, BLOOD (ROUTINE X 2)  ?CULTURE, BLOOD (ROUTINE X 2)  ?HIV ANTIBODY (ROUTINE TESTING W REFLEX)  ?BASIC METABOLIC PANEL  ?CBC  ? ? ? ?EKG ? ?None ? ? ?RADIOLOGY ?I independently interpreted and visualized the right foot x-ray. My interpretation: No acute osseous abnormality ?Radiology interpretation:  ?IMPRESSION:  ?1. No acute osseous abnormality of the right foot.  ?2. Possible ulceration along the dorsal forefoot.  ?3. Diffuse soft tissue edema.  ?   ? ? ? ?PROCEDURES: ? ?Critical Care performed: No ? ?Procedures ? ? ?MEDICATIONS ORDERED IN ED: ?Medications - No data to display ? ? ?IMPRESSION / MDM / ASSESSMENT AND PLAN / ED COURSE  ?I reviewed the triage vital signs and the nursing notes. ?             ?               ? ?  Differential diagnosis includes, but is not limited to, cellulitis, deep tissue infection, osteomyelitis. ? ?Patient presented to the emergency department today because of concerns for right foot ulcer and infection.  On exam patient does have a deep dorsal ulcer to his right foot with pus and necrotic tissue.  X-ray was obtained which did not show any obvious bone involvement.  Will start on IV antibiotics. Discussed with Dr. Sedalia Muta with the hospitalist service who will plan on admission. ?  ? ? ?FINAL CLINICAL IMPRESSION(S) / ED DIAGNOSES  ? ?Final diagnoses:  ?Ulcer of right foot, unspecified ulcer stage (HCC)  ? ? ? ? ? ?Note:  This document was prepared using Dragon voice recognition software and may include unintentional dictation errors. ? ?  ?Phineas Semen, MD ?07/09/21 2348 ? ?

## 2021-07-09 NOTE — ED Triage Notes (Signed)
Pt comes from home via [facility] with complaints of bilateral leg swelling. Pt states this has been happening for a week. Pt has open sore on r. Foot that bleeds. ?

## 2021-07-10 ENCOUNTER — Inpatient Hospital Stay: Payer: Medicaid Other

## 2021-07-10 ENCOUNTER — Encounter: Payer: Self-pay | Admitting: Internal Medicine

## 2021-07-10 DIAGNOSIS — L03115 Cellulitis of right lower limb: Secondary | ICD-10-CM | POA: Insufficient documentation

## 2021-07-10 DIAGNOSIS — L97519 Non-pressure chronic ulcer of other part of right foot with unspecified severity: Secondary | ICD-10-CM

## 2021-07-10 DIAGNOSIS — I890 Lymphedema, not elsewhere classified: Secondary | ICD-10-CM

## 2021-07-10 DIAGNOSIS — I5032 Chronic diastolic (congestive) heart failure: Secondary | ICD-10-CM

## 2021-07-10 LAB — BASIC METABOLIC PANEL
Anion gap: 6 (ref 5–15)
BUN: 7 mg/dL (ref 6–20)
CO2: 26 mmol/L (ref 22–32)
Calcium: 8.3 mg/dL — ABNORMAL LOW (ref 8.9–10.3)
Chloride: 104 mmol/L (ref 98–111)
Creatinine, Ser: 0.66 mg/dL (ref 0.61–1.24)
GFR, Estimated: >60 mL/min (ref >60)
Glucose, Bld: 130 mg/dL — ABNORMAL HIGH (ref 70–99)
Potassium: 3.8 mmol/L (ref 3.5–5.1)
Sodium: 136 mmol/L (ref 135–145)

## 2021-07-10 LAB — CBC
HCT: 29.5 % — ABNORMAL LOW (ref 39.0–52.0)
Hemoglobin: 8.6 g/dL — ABNORMAL LOW (ref 13.0–17.0)
MCH: 20 pg — ABNORMAL LOW (ref 26.0–34.0)
MCHC: 29.2 g/dL — ABNORMAL LOW (ref 30.0–36.0)
MCV: 68.4 fL — ABNORMAL LOW (ref 80.0–100.0)
Platelets: 255 10*3/uL (ref 150–400)
RBC: 4.31 MIL/uL (ref 4.22–5.81)
RDW: 19.4 % — ABNORMAL HIGH (ref 11.5–15.5)
WBC: 7.3 10*3/uL (ref 4.0–10.5)
nRBC: 0 % (ref 0.0–0.2)

## 2021-07-10 LAB — MRSA NEXT GEN BY PCR, NASAL: MRSA by PCR Next Gen: DETECTED — AB

## 2021-07-10 LAB — C-REACTIVE PROTEIN: CRP: 1.2 mg/dL — ABNORMAL HIGH (ref <1.0)

## 2021-07-10 LAB — HIV ANTIBODY (ROUTINE TESTING W REFLEX): HIV Screen 4th Generation wRfx: NONREACTIVE

## 2021-07-10 LAB — SEDIMENTATION RATE: Sed Rate: 109 mm/hr — ABNORMAL HIGH (ref 0–20)

## 2021-07-10 MED ORDER — VANCOMYCIN HCL 1500 MG/300ML IV SOLN
1500.0000 mg | Freq: Once | INTRAVENOUS | Status: AC
  Administered 2021-07-10: 1500 mg via INTRAVENOUS
  Filled 2021-07-10: qty 300

## 2021-07-10 MED ORDER — SODIUM CHLORIDE 0.9 % IV SOLN
2.0000 g | Freq: Three times a day (TID) | INTRAVENOUS | Status: DC
  Administered 2021-07-10 – 2021-07-14 (×12): 2 g via INTRAVENOUS
  Filled 2021-07-10 (×4): qty 12.5
  Filled 2021-07-10 (×2): qty 2
  Filled 2021-07-10: qty 12.5
  Filled 2021-07-10 (×5): qty 2
  Filled 2021-07-10 (×2): qty 12.5

## 2021-07-10 MED ORDER — HYDRALAZINE HCL 10 MG PO TABS
10.0000 mg | ORAL_TABLET | Freq: Four times a day (QID) | ORAL | Status: AC | PRN
  Filled 2021-07-10: qty 1

## 2021-07-10 MED ORDER — VANCOMYCIN HCL 2000 MG/400ML IV SOLN
2000.0000 mg | Freq: Three times a day (TID) | INTRAVENOUS | Status: DC
  Administered 2021-07-10 – 2021-07-11 (×4): 2000 mg via INTRAVENOUS
  Filled 2021-07-10 (×5): qty 400

## 2021-07-10 MED ORDER — METOPROLOL TARTRATE 5 MG/5ML IV SOLN: 5.0000 mg | INTRAVENOUS | Status: AC | PRN

## 2021-07-10 MED ORDER — HEPARIN SODIUM (PORCINE) 5000 UNIT/ML IJ SOLN
5000.0000 [IU] | Freq: Once | INTRAMUSCULAR | Status: AC
  Administered 2021-07-10: 5000 [IU] via SUBCUTANEOUS
  Filled 2021-07-10: qty 1

## 2021-07-10 MED ORDER — GADOBUTROL 1 MMOL/ML IV SOLN
10.0000 mL | Freq: Once | INTRAVENOUS | Status: AC | PRN
  Administered 2021-07-10: 10 mL via INTRAVENOUS

## 2021-07-10 NOTE — Consult Note (Signed)
PODIATRY / FOOT AND ANKLE SURGERY CONSULTATION NOTE ? ?Requesting Physician: Dr. Chipper Herb ? ?Reason for consult: Foot/leg wounds, abscess ? ?Chief Complaint: Leg swelling and wounds ?  ?HPI: Alfred [name] is a 56 y.o. male who presents with multiple wounds to both lower legs and more substantial wound to the dorsal aspect of the right foot.  He has been in and out of the hospital due to lymphedema/swelling with cellulitis to lower legs and chronic wounds.  Patient does not walk much at all.  Patient presents to the ED whenever he has a problem then comes in every now and then due to leg swelling and wounds.  Patient is not does not see anyone for his wounds as outpatient.  Patient had an MRI while in house which did show a possible abscess present to the medial aspect of the right foot near the navicular area that appeared to be within the subcutaneous tissue and very superficial.  X-ray and MRI imaging did not reveal any deeper infection or bone infection at this time.  Patient currently endorses pain to the area of the wound on the top of the right foot. ? ?PMHx:  ?Past Medical History:  ?Diagnosis Date  ? Lower extremity edema   ? Morbid obesity (HCC) 11/27/2019  ? Tobacco abuse 11/27/2019  ? ? ?Surgical Hx:  ?Past Surgical History:  ?Procedure Laterality Date  ? DEBRIDEMENT OF ABDOMINAL WALL ABSCESS    ? TEE WITHOUT CARDIOVERSION N/A 12/07/2019  ? Procedure: TRANSESOPHAGEAL ECHOCARDIOGRAM (TEE);  Surgeon: Antonieta Iba, MD;  Location: [facility] ORS;  Service: Cardiovascular;  Laterality: N/A;  ? ? ?FHx:  ?Family History  ?Problem Relation Age of Onset  ? Heart disease Mother   ?     died in her 55's  ? Heart disease Father   ?     died in his 76's  ? ? ?Social History:  reports that he has been smoking cigarettes. He has been smoking an average of .1 packs per day. He has never used smokeless tobacco. He reports current alcohol use. He reports current drug use. Drug: Marijuana. ? ?Allergies: No Known  Allergies ? ? ?Medications Prior to Admission  ?Medication Sig Dispense Refill  ? acetaminophen (TYLENOL) 325 MG tablet Take 2 tablets (650 mg total) by mouth every 6 (six) hours as needed for mild pain. (Patient not taking: Reported on 07/10/2021)    ? amiodarone (PACERONE) 200 MG tablet Take 1 tablet (200 mg total) by mouth daily. (Patient not taking: Reported on 07/10/2021) 30 tablet 0  ? amiodarone (PACERONE) 200 MG tablet Take 1 tablet (200 mg total) by mouth 2 (two) times daily for 3 doses. 3 tablet 0  ? apixaban (ELIQUIS) 5 MG TABS tablet Take 1 tablet (5 mg total) by mouth 2 (two) times daily. (Patient not taking: Reported on 07/10/2021) 60 tablet 0  ? potassium chloride SA (KLOR-CON) 10 MEQ tablet Take 1 tablet (10 mEq total) by mouth daily. (Patient not taking: Reported on 07/10/2021) 30 tablet 0  ? torsemide (DEMADEX) 20 MG tablet Take 2 tablets (40 mg total) by mouth daily. (Patient not taking: Reported on 07/10/2021) 120 tablet 0  ? torsemide (DEMADEX) 20 MG tablet Take 2 tablets (40 mg total) by mouth daily as needed (Take 2 tablets in the evening as needed for leg swelling or weight gain of more than 3 pounds). (Patient not taking: Reported on 07/10/2021)    ? ? ?Physical Exam: ?General: Alert and oriented.  No apparent distress. ? ?  Vascular: DP/PT pulses nonpalpable bilateral likely secondary to swelling.  Severe lymphedema present to bilateral lower extremities. ? ?Neuro: Light touch sensation diminished to both feet. ? ?Derm: Large dorsal ulceration to the top of the foot which measures 3 cm x 3 cm and goes to the subcutaneous tissue and is near tendon, appears to have large amount of necrotic fibrotic tissue within the wound with scant areas of granularity, serosanguineous drainage, odor noted, no specific erythema present to this area. ? ?Also has scattered superficial lymphedema type ulcerations to bilateral lower extremities with weeping clear fluid. ? ?No obvious evidence of abscess over the right medial  foot in the area that was seen on the MRI.  Difficult to determine any fluctuance to this area due to patient's swelling. ? ?Severe lichenification of skin noted to bilateral lower extremities. ? ?MSK: Pain on palpation to the right foot. ? ?Results for orders placed or performed during the hospital encounter of 07/09/21 (from the past 48 hour(s))  ?CBC with Differential     Status: Abnormal  ? Collection Time: 07/09/21  9:39 PM  ?Result Value Ref Range  ? WBC 6.5 4.0 - 10.5 K/uL  ? RBC 4.71 4.22 - 5.81 MIL/uL  ? Hemoglobin 9.5 (L) 13.0 - 17.0 g/dL  ? HCT 32.5 (L) 39.0 - 52.0 %  ? MCV 69.0 (L) 80.0 - 100.0 fL  ? MCH 20.2 (L) 26.0 - 34.0 pg  ? MCHC 29.2 (L) 30.0 - 36.0 g/dL  ? RDW 19.9 (H) 11.5 - 15.5 %  ? Platelets 280 150 - 400 K/uL  ? nRBC 0.0 0.0 - 0.2 %  ? Neutrophils Relative % 72 %  ? Neutro Abs 4.8 1.7 - 7.7 K/uL  ? Lymphocytes Relative 14 %  ? Lymphs Abs 0.9 0.7 - 4.0 K/uL  ? Monocytes Relative 9 %  ? Monocytes Absolute 0.6 0.1 - 1.0 K/uL  ? Eosinophils Relative 3 %  ? Eosinophils Absolute 0.2 0.0 - 0.5 K/uL  ? Basophils Relative 1 %  ? Basophils Absolute 0.0 0.0 - 0.1 K/uL  ? Immature Granulocytes 1 %  ? Abs Immature Granulocytes 0.03 0.00 - 0.07 K/uL  ?  Comment: Performed at [facility], [address] [place], [place] [address]  ?Basic metabolic panel     Status: Abnormal  ? Collection Time: 07/09/21  9:39 PM  ?Result Value Ref Range  ? Sodium 134 (L) 135 - 145 mmol/L  ? Potassium 3.8 3.5 - 5.1 mmol/L  ? Chloride 100 98 - 111 mmol/L  ? CO2 25 22 - 32 mmol/L  ? Glucose, Bld 113 (H) 70 - 99 mg/dL  ?  Comment: Glucose reference range applies only to samples taken after fasting for at least 8 hours.  ? BUN 8 6 - 20 mg/dL  ? Creatinine, Ser 0.75 0.61 - 1.24 mg/dL  ? Calcium 8.7 (L) 8.9 - 10.3 mg/dL  ? GFR, Estimated >60 >60 mL/min  ?  Comment: (NOTE) ?Calculated using the CKD-EPI Creatinine Equation (2021) ?  ? Anion gap 9 5 - 15  ?  Comment: Performed at [facility], [address] [place], [place] [address]  ?Blood culture (routine x 2)     Status: None (Preliminary result)  ? Collection Time: 07/09/21  9:39 PM  ? Specimen: BLOOD  ?Result Value Ref Range  ? Specimen Description BLOOD RIGHT ASSIST CONTROL   ? Special Requests    ?  BOTTLES DRAWN AEROBIC AND ANAEROBIC Blood Culture results may  not be optimal due to an inadequate volume of blood received in culture bottles  ? Culture    ?  NO GROWTH < 12 HOURS ?Performed at [facility], [address], [place], [place] [address] ?  ? Report Status PENDING   ?Sedimentation rate     Status: Abnormal  ? Collection Time: 07/09/21  9:39 PM  ?Result Value Ref Range  ? Sed Rate 109 (H) 0 - 20 mm/hr  ?  Comment: Performed at [facility], [address] [place], [place] [address]  ?Blood culture (routine x 2)     Status: None (Preliminary result)  ? Collection Time: 07/09/21 10:10 PM  ? Specimen: BLOOD  ?Result Value Ref Range  ? Specimen Description BLOOD LEFT HAND   ? Special Requests    ?  BOTTLES DRAWN AEROBIC AND ANAEROBIC Blood Culture adequate volume  ? Culture    ?  NO GROWTH < 12 HOURS ?Performed at [facility], [address], [place], [place] [address] ?  ? Report Status PENDING   ?MRSA Next Gen by PCR, Nasal     Status: Abnormal  ? Collection Time: 07/10/21 12:22 AM  ? Specimen: Nasal Mucosa; Nasal Swab  ?Result Value Ref Range  ? MRSA by PCR Next Gen DETECTED (A) NOT DETECTED  ?  Comment: RESULT CALLED TO, READ BACK BY AND VERIFIED WITH: ?Rennie Plowman 07/10/21 0620 MW ?(NOTE) ?The GeneXpert MRSA Assay (FDA approved for NASAL specimens only), ?is one component of a comprehensive MRSA colonization surveillance ?program. It is not intended to diagnose MRSA infection nor to guide ?or monitor treatment for MRSA infections. ?Test performance is not FDA approved in patients less than 2 years ?old. ?Performed at [facility], [address] [place], ?[place] [address] ?  ?Basic metabolic panel     Status:  Abnormal  ? Collection Time: 07/10/21  5:57 AM  ?Result Value Ref Range  ? Sodium 136 135 - 145 mmol/L  ? Potassium 3.8 3.5 - 5.1 mmol/L  ? Chloride 104 98 - 111 mmol/L  ? CO2 26 22 - 32 mmol/L  ? Glucose, Bld 130 (

## 2021-07-10 NOTE — Hospital Course (Addendum)
Mr. Colyn Miron is a 56 year old male with bilateral lower extremity lymphadenopathy, obesity, chronic bilateral lower extremity venous stasis, history of atrial flutter, on anticoagulation, history of Streptococcus group B bacteremia, who presents emergency department via EMS for chief concerns of bilateral lower extremity swelling. ? ?Upon arriving the hospital, he was found to have peak ulceration in left dorsal foot.  With associated cellulitis.  Patient was placed on antibiotics with vancomycin and cefepime. ? ?MRI did not show osteomyelitis, but showed abscess.  I&D performed on 4/7. ?

## 2021-07-10 NOTE — Plan of Care (Signed)

## 2021-07-10 NOTE — Progress Notes (Signed)
Pharmacy Antibiotic Note ? ?Alfred [name] is a 56 y.o. male admitted on 07/09/2021 with osteomyelitis/wound infection.  Pharmacy has been consulted for Cefepime and Vancomycin dosing. ? ?Plan: ?Cefepime 2 gm q8h per indication and renal fxn. ? ?Pt ordered total initial dose of Vancomycin 2500 mg ?Vancomycin 2000 mg IV Q 8 hrs.  ?Goal AUC 400-550. ?Expected AUC: 482.3 ?SCr used: 0.75, Vd used: 0.5, BMI 48.2 ? ?Pharmacy will continue to follow and will adjust abx dosing whenever warranted. ? ? ?Height: 6\' 7"  (200.7 cm) ?Weight: (!) 194 kg (427 lb 11.1 oz) ?IBW/kg (Calculated) : 93.7 ? ?Temp (24hrs), Avg:97.9 ?F (36.6 ?C), Min:97.6 ?F (36.4 ?C), Max:98.2 ?F (36.8 ?C) ? ?Recent Labs  ?Lab 07/09/21 ?2139  ?WBC 6.5  ?CREATININE 0.75  ?  ?Estimated Creatinine Clearance: 199.8 mL/min (by C-G formula based on SCr of 0.75 mg/dL).   ? ?No Known Allergies ? ?Antimicrobials this admission: ?4/05 Vancomycin >>  ?4/06 Cefepime >>  ? ?Microbiology results: ?4/05 BCx: Pending ? ?Thank you for allowing pharmacy to be a part of this patient?s care. ? ?Alfred [name], PharmD, MBA ?07/10/2021 ?1:47 AM ? ? ?

## 2021-07-10 NOTE — Assessment & Plan Note (Addendum)
Still in sinus rhythm. ?

## 2021-07-10 NOTE — Assessment & Plan Note (Addendum)
Patient has a chronic lymphedema in bilateral lower extremities, this is a probably the source of ulceration.  ABI he has mild peripheral vessel disease.  Patient has been evaluated by vascular surgery, currently has no plan for intervention. ?

## 2021-07-10 NOTE — Progress Notes (Signed)
?  Progress Note ? ? ?Patient: Alfred [name] KGY:[id number] DOB: May 24, 1965 DOA: 07/09/2021     1 ?DOS: the patient was seen and examined on 07/10/2021 ?  ?Brief hospital course: ?Mr. Vince Ainsley is a 56 year old male with bilateral lower extremity lymphadenopathy, obesity, chronic bilateral lower extremity venous stasis, history of atrial flutter, on anticoagulation, history of Streptococcus group B bacteremia, who presents emergency department via EMS for chief concerns of bilateral lower extremity swelling. ? ?Upon arriving the hospital, he was found to have peak ulceration in left dorsal foot.  With associated cellulitis.  Patient was placed on antibiotics with vancomycin and cefepime. ? ?Assessment and Plan: ?* Foot ulcer (HCC) ?Continue antibiotics with cefepime and vancomycin for now.  Spoke with Dr. Excell Seltzer, he will see the patient today. ? ?Cellulitis of right foot ?This is associated with foot ulcer.  Antibiotics as above. ?MRI of the foot was performed last night, no evidence of osteomyelitis, has subcutaneous abscess.  Patient will be seen by podiatry today. ? ?Chronic diastolic CHF (congestive heart failure) (HCC) ?Most recent echocardiogram was on 12/2019, poor quality due to morbid obesity.  Ejection fraction was estimated to be 55%.  Currently patient does not have any volume overload to suggest exacerbation of congestive heart failure. ? ?Lymphedema of both lower extremities ?Patient has a chronic lymphedema in bilateral lower extremities, this is a probably the source of ulceration.  We will obtain ABI to rule out peripheral vessel disease. ? ?Paroxysmal atrial flutter (HCC) ?Patient was not chronically taking anticoagulation.  Currently heart rate seem to be regular, will obtain EKG to confirm it is sinus. ? ?Morbid obesity (HCC) ?Diet and exercise advised ? ? ? ? ?  ? ?Subjective:  ?Patient has significant debility at baseline.  Currently he does not feel short of breath. ?He still complaining of  pain in the right leg. ? ?Physical Exam: ?Vitals:  ? 07/09/21 2300 07/09/21 2344 07/09/21 2354 07/10/21 0324  ?BP: (!) 148/64  (!) 170/98 (!) 143/74  ?Pulse: 76  90 79  ?Resp: 20  18 17   ?Temp:  97.6 ?F (36.4 ?C) 98.2 ?F (36.8 ?C) 98.6 ?F (37 ?C)  ?TempSrc:  Oral    ?SpO2: 97%  100% 99%  ?Weight:      ?Height:      ? ?General exam: Appears calm and comfortable, morbid obese ?Respiratory system: Clear to auscultation. Respiratory effort normal. ?Cardiovascular system: S1 & S2 heard, RRR. No JVD, murmurs, rubs, gallops or clicks.  ?Gastrointestinal system: Abdomen is nondistended, soft and nontender. No organomegaly or masses felt. Normal bowel sounds heard. ?Central nervous system: Alert and oriented. No focal neurological deficits. ?Extremities: Bilateral lower extremity lymphedema.  Right foot large ulceration. ?Skin: No rashes, lesions or ulcers ?Psychiatry: Judgement and insight appear normal. Mood & affect appropriate.  ? ?Data Reviewed: ? ?Reviewed x-ray and MRI results. ?Reviewed all lab results. ? ?Family Communication: No contact listed ? ?Disposition: ?Status is: Inpatient ?Remains inpatient appropriate because: Severity of disease, pending procedure and IV antibiotics. ? Planned Discharge Destination: Home with Home Health ? ? ? ?Time spent: 28 minutes ? ?Author: ? , MD ?07/10/2021 10:14 AM ? ?For on call review [email or web address].  ?

## 2021-07-10 NOTE — Assessment & Plan Note (Addendum)
Most recent echocardiogram was on 12/2019, poor quality due to morbid obesity.  Ejection fraction was estimated to be 55%.   ?No shortness of breath, no exacerbation. ?

## 2021-07-10 NOTE — Assessment & Plan Note (Deleted)
?  MRI of the foot was performed last night, no evidence of osteomyelitis, has subcutaneous abscess.  Continue antibiotics with cefepime and vancomycin. ?

## 2021-07-10 NOTE — H&P (Addendum)
?History and Physical  ? ?[employer] [id number] DOB: 1965/05/27 DOA: 07/09/2021 ? ?PCP: Patient, No Pcp Per (Inactive)  ?Outpatient Specialists: Dr. Rockey Situ, [facility] cardiology ?Patient coming from: Home via EMS ? ?I have personally briefly reviewed patient's old medical records in [place]. ? ?Chief Concern: Right lower extremity wounds ? ?HPI: Alfred [name] is a 56 year old male with bilateral lower extremity lymphadenopathy, obesity, chronic bilateral lower extremity venous stasis, history of atrial flutter, on anticoagulation, history of Streptococcus group B bacteremia, who presents emergency department via EMS for chief concerns of bilateral lower extremity swelling. ? ?Initial vitals in the emergency department showed temperature of 97.9, respiration rate of 15, heart rate 91, blood pressure 161/78, SPO2 98% on room air. ? ?Serum sodium was 134, potassium 3.8, chloride 100, bicarb 25, nonfasting blood glucose 113, BUN of 8, serum creatinine of 0.75, GFR greater than 60, WBC 6.5, hemoglobin 9.5, platelets of 280. ? ?Right foot x-ray ordered by EDP was read as no acute osseous abnormality of the right foot, possible ulceration along the dorsal forefoot, diffuse soft tissue edema. ? ?Blood cultures x2 collected and in process. ? ?ED treatment: Vancomycin. ? ?At bedside he is able to tell me his name, age, current calendar year and he knows he is in the hospital.  He states that he has been having a right foot wound for about 2 weeks.  He reports that he has been having pus and blood discharge from the wound. ? ?He denies any fever, chest pain, abdominal pain, shortness of breath, dysuria, diarrhea, syncope, loss of consciousness. ? ?Social history: He lives at home with a cousin.  He denies tobacco use.  He endorses formerly drinking alcohol however he stopped because it worsened his lower extremity swelling.  He states he has not had an alcoholic beverage for several months.  He did not denies  IV drug use.  He endorses THC smoking. ? ?Vaccination history: He does not know if he is vaccinated for COVID-19 or influenza ? ?ROS: ?Constitutional: no weight change, no fever ?ENT/Mouth: no sore throat, no rhinorrhea ?Eyes: no eye pain, no vision changes ?Cardiovascular: no chest pain, no dyspnea,  + edema, no palpitations ?Respiratory: no cough, no sputum, no wheezing ?Gastrointestinal: no nausea, no vomiting, no diarrhea, no constipation ?Genitourinary: no urinary incontinence, no dysuria, no hematuria ?Musculoskeletal: no arthralgias, no myalgias ?Skin: + skin lesions, no pruritus ?Neuro: + weakness, no loss of consciousness, no syncope ?Psych: no anxiety, no depression, no decrease appetite ?Heme/Lymph: no bruising, no bleeding ? ?ED Course: Discussed with EDP, patient requiring hospitalization for chief concerns of right foot possible osteomyelitis. ? ?Assessment/Plan ? ?Principal Problem: ?  Foot ulcer ([place]) ?Active Problems: ?  Cellulitis of left leg ?  Lower extremity edema ?  Morbid obesity ([place]) ?  Atrial flutter ([place]) ?  Lymphedema of both lower extremities ?  ?Assessment and Plan: ?* Foot ulcer ([place]) ?- Admit to MedSurg, inpatient, ?- MRSA PCR ?- Sed rate, CRP to assess baseline ?- Continue vancomycin and added cefepime per pharmacy ?- Bilateral lower extremity ABI to assess for vascular integrity ?- Recommend a.m. team follow-up on ABI and possible consultation to vascular and patient may need podiatry versus orthopedic surgeon for possible amputation ?- MRI of the right foot with and without contrast ordered ? ?Lymphedema of both lower extremities ?- Continue outpatient follow-up with PCP ? ?Atrial flutter ([place]) ?- Patient states that he does not need to take rate control medication or anticoagulation any longer  and has not taken any in many months ?- Amiodarone and Eliquis have not been resumed ?- Metoprolol 5 mg IV every hours as needed for heart rate greater than 120, 12 hours  ordered ? ?Cellulitis of left leg ?- Treat per foot ulcer/deep wound of the right foot ? ?DVT prophylaxis-I have ordered heparin one-time dose on admission in anticipation of possible procedure ?- AM team to resume DVT prophylaxis when appropriate ? ?Chart reviewed.  ? ?Hospitalization from 11/24/2019-12/11/2019: Mr. Wiesehan presents to the ED due to developing progressively worsening bilateral lower extremity swelling, left greater than the right associate with pain and skin breakdown with oozing.  He was found to have severe streptococcal sepsis, with blood cultures positive for group B strep bacteremia.  Wound culture from the left leg showed Pseudomonas aeruginosa.  Orthopedic and podiatrist were consulted to evaluate left knee and left foot respectively, conservative management with antibiotics were recommended at that time.  He also had AKI from sepsis which improved with IV fluids.  He had significantly elevated D-dimer, venous duplex of the lower extremity was negative for DVT and CTA of the chest was negative for acute PE.  His hospitalization was complicated by atrial flutter with RVR and he was treated with IV Cardizem infusion and IV amiodarone infusion.  Cardiologist was consulted and he underwent TEE with no evidence of vegetation or infective endocarditis.  He converted to normal sinus rhythm on November 28, 2019.  He was started on heparin infusion and transition to Eliquis.  He also had acute on chronic diastolic heart failure and he was treated with IV furosemide and transition to torsemide 40 mg daily. ? ?Infectious disease was consulted to assist with group B strep bacteremia.  He was treated with IV Unasyn while inpatient and discharged with ciprofloxacin 500 mg p.o. twice daily for 5 more days. ? ?DVT prophylaxis: Heparin one-time dose ordered ?Code Status: Full code ?Diet: Heart healthy ?Family Communication: No ?Disposition Plan: Pending clinical course ?Consults called: None at this  time ?Admission status: MedSurg, inpatient ? ?Past Medical History:  ?Diagnosis Date  ? Lower extremity edema   ? Morbid obesity ([place]) 11/27/2019  ? Tobacco abuse 11/27/2019  ? ?Past Surgical History:  ?Procedure Laterality Date  ? DEBRIDEMENT OF ABDOMINAL WALL ABSCESS    ? TEE WITHOUT CARDIOVERSION N/A 12/07/2019  ? Procedure: TRANSESOPHAGEAL ECHOCARDIOGRAM (TEE);  Surgeon: Minna Merritts, MD;  Location: [facility] ORS;  Service: Cardiovascular;  Laterality: N/A;  ? ?Social History:  reports that he has been smoking cigarettes. He has been smoking an average of .1 packs per day. He has never used smokeless tobacco. He reports current alcohol use. He reports current drug use. Drug: Marijuana. ? ?No Known Allergies ?Family History  ?Problem Relation Age of Onset  ? Heart disease Mother   ?     died in her 22's  ? Heart disease Father   ?     died in his 43's  ? ?Family history: Family history reviewed and not pertinent ? ?Prior to Admission medications   ?Medication Sig Start Date End Date Taking? Authorizing Provider  ?acetaminophen (TYLENOL) 325 MG tablet Take 2 tablets (650 mg total) by mouth every 6 (six) hours as needed for mild pain. 12/11/19   Jennye Boroughs, MD  ?amiodarone (PACERONE) 200 MG tablet Take 1 tablet (200 mg total) by mouth daily. 12/13/19   Jennye Boroughs, MD  ?amiodarone (PACERONE) 200 MG tablet Take 1 tablet (200 mg total) by mouth 2 (two) times daily  for 3 doses. 12/11/19 12/13/19  Jennye Boroughs, MD  ?apixaban (ELIQUIS) 5 MG TABS tablet Take 1 tablet (5 mg total) by mouth 2 (two) times daily. 12/11/19   Jennye Boroughs, MD  ?potassium chloride SA (KLOR-CON) 10 MEQ tablet Take 1 tablet (10 mEq total) by mouth daily. 12/12/19   Jennye Boroughs, MD  ?torsemide (DEMADEX) 20 MG tablet Take 2 tablets (40 mg total) by mouth daily. 12/13/19   Jennye Boroughs, MD  ?torsemide (DEMADEX) 20 MG tablet Take 2 tablets (40 mg total) by mouth daily as needed (Take 2 tablets in the evening as needed for leg swelling or weight gain of  more than 3 pounds). 12/11/19   Jennye Boroughs, MD  ? ?Physical Exam: ?Vitals:  ? 07/09/21 2241 07/09/21 2300 07/09/21 2344 07/09/21 2354  ?BP: 135/67 (!) 148/64  (!) 170/98  ?Pulse: 74 76  90  ?Resp: 15 20  18   ?Temp:   97.6 ?F (36.

## 2021-07-10 NOTE — Assessment & Plan Note (Addendum)
Status post I&D for abscess. ?Discussed with Dr. Excell Seltzer, patient eventually will need skin graft after treatment of infection. ?.  Patient was kept in the hospital for additional day to set up home care and wound care as outpatient. ?Patient will follow up with Dr. Excell Seltzer as outpatient in the future for possible skin graft. ?

## 2021-07-10 NOTE — Consult Note (Signed)
?Alfred [name] ?Vascular Consult Note ? ?MRN : [id number] ? ?Alfred [name] is a 56 y.o. (10/14/1965) male who presents with chief complaint of  ?Chief Complaint  ?Patient presents with  ? Leg Swelling  ?Marland Kitchen. ?Consulting physician: Alfred [name] ?Reason for consult: Leg swelling and ulcerations ?History of Present Illness: Alfred [name] is a 56 year old male that presents to [facility] on 07/09/2021 due to draining an ulcer of his right foot.  The patient has had a longstanding history of lymphedema.  The patient notes that sometimes he would have small wounds and blisters and would cover them with Neosporin and use an Ace bandage.  However he notes that this time he began to have extensive drainage and noticed an opening in his right foot.  There was also copious foul-smelling drainage associated per the patient.  The patient does not walk extensively.  The patient notes that he does not wear compression on a regular basis.  He currently is not receiving any outpatient treatment for wounds.  Studies while in the hospital show an abscess in the medial aspect of the right foot.  There is no deeper infection or osteomyelitis associated.  He also underwent ABIs which showed ABIs of 0.88 on the right and 0.86 on the left.  Right the tibial artery waveforms were noted as biphasic. ? ?Current Facility-Administered Medications  ?Medication Dose Route Frequency Provider Last Rate Last Admin  ? acetaminophen (TYLENOL) tablet 650 mg  650 mg Oral Q6H PRN Cox, Amy N, DO      ? Or  ? acetaminophen (TYLENOL) suppository 650 mg  650 mg Rectal Q6H PRN Cox, Amy N, DO      ? ceFEPIme (MAXIPIME) 2 g in sodium chloride 0.9 % 100 mL IVPB  2 g Intravenous Q8H Belue, Lendon Collarathan S, RPH 200 mL/hr at 07/10/21 2110 2 g at 07/10/21 2110  ? hydrALAZINE (APRESOLINE) tablet 10 mg  10 mg Oral Q6H PRN Cox, Amy N, DO      ? ondansetron (ZOFRAN) tablet 4 mg  4 mg Oral Q6H PRN Cox, Amy N, DO      ? Or  ?  ondansetron (ZOFRAN) injection 4 mg  4 mg Intravenous Q6H PRN Cox, Amy N, DO      ? vancomycin (VANCOREADY) IVPB 2000 mg/400 mL  2,000 mg Intravenous Q8H Belue, Lendon Collarathan S, RPH 200 mL/hr at 07/10/21 1805 2,000 mg at 07/10/21 1805  ? ? ?Past Medical History:  ?Diagnosis Date  ? Lower extremity edema   ? Morbid obesity (HCC) 11/27/2019  ? Tobacco abuse 11/27/2019  ? ? ?Past Surgical History:  ?Procedure Laterality Date  ? DEBRIDEMENT OF ABDOMINAL WALL ABSCESS    ? TEE WITHOUT CARDIOVERSION N/A 12/07/2019  ? Procedure: TRANSESOPHAGEAL ECHOCARDIOGRAM (TEE);  Surgeon: Antonieta IbaGollan, Timothy J, MD;  Location: [facility] ORS;  Service: Cardiovascular;  Laterality: N/A;  ? ? ?Social History ?Social History  ? ?Tobacco Use  ? Smoking status: Every Day  ?  Packs/day: 0.10  ?  Types: Cigarettes  ? Smokeless tobacco: Never  ? Tobacco comments:  ?  smokes 1/2 pack per week.  ?Substance Use Topics  ? Alcohol use: Yes  ?  Comment: 1 beer/month  ? Drug use: Yes  ?  Types: Marijuana  ?  Comment: denies this 11/27/2019  ? ? ?Family History ?Family History  ?Problem Relation Age of Onset  ? Heart disease Mother   ?     died in her 7360's  ? Heart  disease Father   ?     died in his 53's  ? ? ?No Known Allergies ? ? ?REVIEW OF SYSTEMS (Negative unless checked) ? ?Constitutional: [] Weight loss  [] Fever  [] Chills ?Cardiac: [] Chest pain   [] Chest pressure   [] Palpitations   [] Shortness of breath when laying flat   [] Shortness of breath at rest   [] Shortness of breath with exertion. ?Vascular:  [] Pain in legs with walking   [] Pain in legs at rest   [] Pain in legs when laying flat   [] Claudication   [] Pain in feet when walking  [] Pain in feet at rest  [] Pain in feet when laying flat   [] History of DVT   [] Phlebitis   [] Swelling in legs   [] Varicose veins   [x] Non-healing ulcers ?Pulmonary:   [] Uses home oxygen   [] Productive cough   [] Hemoptysis   [] Wheeze  [] COPD   [] Asthma ?Neurologic:  [] Dizziness  [] Blackouts   [] Seizures   [] History of stroke   [] History of  TIA  [] Aphasia   [] Temporary blindness   [] Dysphagia   [] Weakness or numbness in arms   [] Weakness or numbness in legs ?Musculoskeletal:  [] Arthritis   [] Joint swelling   [] Joint pain   [] Low back pain ?Hematologic:  [] Easy bruising  [] Easy bleeding   [] Hypercoagulable state   [] Anemic  [] Hepatitis ?Gastrointestinal:  [] Blood in stool   [] Vomiting blood  [] Gastroesophageal reflux/heartburn   [] Difficulty swallowing. ?Genitourinary:  [] Chronic kidney disease   [] Difficult urination  [] Frequent urination  [] Burning with urination   [] Blood in urine ?Skin:  [] Rashes   [] Ulcers   [x] Wounds ?Psychological:  [] History of anxiety   []  History of major depression. ? ?Physical Examination ? ?Vitals:  ? 07/10/21 0324 07/10/21 1206 07/10/21 1612 07/10/21 1948  ?BP: (!) 143/74 136/70 124/66 136/63  ?Pulse: 79 80 84 89  ?Resp: 17 18 18 18   ?Temp: 98.6 ?F (37 ?C) 98.7 ?F (37.1 ?C) 98.1 ?F (36.7 ?C) 98.6 ?F (37 ?C)  ?TempSrc:  Oral    ?SpO2: 99% 98% 97% 96%  ?Weight:      ?Height:      ? ?Body mass index is 48.18 kg/m?. ?Gen:  WD/WN, NAD ?Head: [place]/AT, No temporalis wasting. Prominent temp pulse not noted. ?Ear/Nose/Throat: Hearing grossly intact, nares w/o erythema or drainage, oropharynx w/o Erythema/Exudate ?Eyes: Sclera non-icteric, conjunctiva clear ?Neck: Trachea midline.  No JVD.  ?Pulmonary:  Good air movement, respirations not labored, equal bilaterally.  ?Cardiac: RRR, normal S1, S2. ?Vascular: 3+ edema bilaterally ?Vessel Right Left  ?PT Not Palpable Not Palpable  ?DP Not Palpable Not Palpable  ? ?Neurologic: Sensation grossly intact in extremities.  Symmetrical.  Speech is fluent. Motor exam as listed above. ?Psychiatric: Judgment intact, Mood & affect appropriate for pt's clinical situation. ?Dermatologic: See attached photo ?Lymph : No Cervical, Axillary, or Inguinal lymphadenopathy. ? ? ? ? ?CBC ?Lab Results  ?Component Value Date  ? WBC 7.3 07/10/2021  ? HGB 8.6 (L) 07/10/2021  ? HCT 29.5 (L) 07/10/2021  ? MCV 68.4  (L) 07/10/2021  ? PLT 255 07/10/2021  ? ? ?BMET ?   ?Component Value Date/Time  ? NA 136 07/10/2021 0557  ? K 3.8 07/10/2021 0557  ? CL 104 07/10/2021 0557  ? CO2 26 07/10/2021 0557  ? GLUCOSE 130 (H) 07/10/2021 0557  ? BUN 7 07/10/2021 0557  ? CREATININE 0.66 07/10/2021 0557  ? CALCIUM 8.3 (L) 07/10/2021 0557  ? GFRNONAA >60 07/10/2021 0557  ? GFRAA >60 12/11/2019 1108  ? ?Estimated  Creatinine Clearance: 199.8 mL/min (by C-G formula based on SCr of 0.66 mg/dL). ? ?COAG ?Lab Results  ?Component Value Date  ? INR 1.2 11/25/2019  ? INR 1.0 11/24/2019  ? ? ?Radiology ?MR FOOT RIGHT W WO CONTRAST ? ?Result Date: 07/10/2021 ?CLINICAL DATA:  Foot swelling, nondiabetic, osteomyelitis suspected deep ulceration of dorsal surface of right foot with hole approximately 4 cm wide and 2 cm deep EXAM: MRI OF THE RIGHT FOREFOOT WITHOUT AND WITH CONTRAST TECHNIQUE: Multiplanar, multisequence MR imaging of the right forefoot was performed before and after the administration of intravenous contrast. CONTRAST:  7mL GADAVIST GADOBUTROL 1 MMOL/ML IV SOLN COMPARISON:  Right foot radiograph 07/09/2021 FINDINGS: Bones/Joint/Cartilage There is no evidence of acute fracture or dislocation. Preserved T1 marrow signal throughout. There is no frank bony destruction. There is mild great toe MTP and IP joint osteoarthritis. Ligaments The Lisfranc ligament appears intact, poorly visualized. Muscles and Tendons Diffuse intramuscular edema muscle atrophy in the forefoot as is commonly seen in diabetics. No acute tendon tear. Soft tissues There is a dorsal forefoot wound. Diffuse soft tissue swelling. There is a rim enhancing fluid collection along the medial midfoot measuring 2.2 x 0.9 x 2.5 cm (axial 2 postcontrast image 3, coronal postcontrast image 15). IMPRESSION: Subcutaneous abscess along the medial midfoot measuring 2.2 x 0.9 x 2.5 cm. Diffuse soft tissue swelling of the foot with dorsal forefoot wound. No evidence of osteomyelitis.  Electronically Signed   By: Caprice Renshaw M.D.   On: 07/10/2021 07:32  ? ?US ARTERIAL ABI (SCREENING LOWER EXTREMITY) ? ?Result Date: 07/10/2021 ?CLINICAL DATA:  Nonhealing right foot wound. History hypertension and smokin

## 2021-07-10 NOTE — Assessment & Plan Note (Deleted)
This is associated with foot ulcer.  Antibiotics as above. ?

## 2021-07-10 NOTE — Assessment & Plan Note (Signed)
Diet and exercise advised ?

## 2021-07-11 ENCOUNTER — Inpatient Hospital Stay: Payer: Medicaid Other | Admitting: Anesthesiology

## 2021-07-11 ENCOUNTER — Other Ambulatory Visit: Payer: Self-pay

## 2021-07-11 ENCOUNTER — Encounter
Admission: EM | Disposition: A | Discharge status: Home Health | Payer: Self-pay | Source: Home / Self Care | Attending: Internal Medicine

## 2021-07-11 DIAGNOSIS — D649 Anemia, unspecified: Secondary | ICD-10-CM

## 2021-07-11 HISTORY — PX: IRRIGATION AND DEBRIDEMENT FOOT: SHX6602

## 2021-07-11 LAB — IRON AND TIBC
Iron: 41 ug/dL — ABNORMAL LOW (ref 45–182)
Saturation Ratios: 11 % — ABNORMAL LOW (ref 17.9–39.5)
TIBC: 378 ug/dL (ref 250–450)
UIBC: 337 ug/dL

## 2021-07-11 LAB — CREATININE, SERUM
Creatinine, Ser: 0.7 mg/dL (ref 0.61–1.24)
GFR, Estimated: >60 mL/min (ref >60)

## 2021-07-11 LAB — VITAMIN B12: Vitamin B-12: 454 pg/mL (ref 180–914)

## 2021-07-11 LAB — FERRITIN: Ferritin: 99 ng/mL (ref 24–336)

## 2021-07-11 SURGERY — IRRIGATION AND DEBRIDEMENT FOOT
Anesthesia: General | Site: Foot | Laterality: Bilateral

## 2021-07-11 MED ORDER — FENTANYL CITRATE (PF) 100 MCG/2ML IJ SOLN
INTRAMUSCULAR | Status: DC | PRN
  Administered 2021-07-11 (×4): 50 ug via INTRAVENOUS

## 2021-07-11 MED ORDER — PROPOFOL 10 MG/ML IV BOLUS
INTRAVENOUS | Status: AC
  Filled 2021-07-11: qty 20

## 2021-07-11 MED ORDER — FERROUS SULFATE 325 (65 FE) MG PO TABS
325.0000 mg | ORAL_TABLET | Freq: Every day | ORAL | Status: DC
  Administered 2021-07-12 – 2021-07-15 (×4): 325 mg via ORAL
  Filled 2021-07-11 (×4): qty 1

## 2021-07-11 MED ORDER — FENTANYL CITRATE (PF) 100 MCG/2ML IJ SOLN: 25.0000 ug | INTRAMUSCULAR | Status: DC | PRN

## 2021-07-11 MED ORDER — MIDAZOLAM HCL 2 MG/2ML IJ SOLN
INTRAMUSCULAR | Status: AC
  Filled 2021-07-11: qty 2

## 2021-07-11 MED ORDER — DEXAMETHASONE SODIUM PHOSPHATE 10 MG/ML IJ SOLN
INTRAMUSCULAR | Status: DC | PRN
  Administered 2021-07-11: 5 mg via INTRAVENOUS

## 2021-07-11 MED ORDER — PROPOFOL 10 MG/ML IV BOLUS
INTRAVENOUS | Status: DC | PRN
  Administered 2021-07-11: 200 mg via INTRAVENOUS

## 2021-07-11 MED ORDER — SODIUM CHLORIDE 0.9 % IV SOLN
INTRAVENOUS | Status: AC | PRN
  Administered 2021-07-11: 1000 mL via INTRAMUSCULAR

## 2021-07-11 MED ORDER — LACTATED RINGERS IV SOLN: INTRAVENOUS | Status: DC | PRN

## 2021-07-11 MED ORDER — FENTANYL CITRATE (PF) 100 MCG/2ML IJ SOLN
INTRAMUSCULAR | Status: AC
  Filled 2021-07-11: qty 2

## 2021-07-11 MED ORDER — GLYCOPYRROLATE 0.2 MG/ML IJ SOLN
INTRAMUSCULAR | Status: DC | PRN
  Administered 2021-07-11: .1 mg via INTRAVENOUS

## 2021-07-11 MED ORDER — CHLORHEXIDINE GLUCONATE 0.12 % MT SOLN
OROMUCOSAL | Status: AC
  Filled 2021-07-11: qty 15

## 2021-07-11 MED ORDER — BUPIVACAINE HCL (PF) 0.5 % IJ SOLN
INTRAMUSCULAR | Status: AC
  Filled 2021-07-11: qty 30

## 2021-07-11 MED ORDER — MIDAZOLAM HCL 2 MG/2ML IJ SOLN
INTRAMUSCULAR | Status: DC | PRN
  Administered 2021-07-11: 2 mg via INTRAVENOUS

## 2021-07-11 MED ORDER — LIDOCAINE HCL (PF) 2 % IJ SOLN
INTRAMUSCULAR | Status: AC
  Filled 2021-07-11: qty 5

## 2021-07-11 MED ORDER — LIDOCAINE HCL (PF) 1 % IJ SOLN
INTRAMUSCULAR | Status: DC | PRN
  Administered 2021-07-11: 20 mL

## 2021-07-11 MED ORDER — LIDOCAINE HCL (PF) 1 % IJ SOLN
INTRAMUSCULAR | Status: AC
Start: 2021-07-11 — End: ?
  Filled 2021-07-11: qty 30

## 2021-07-11 MED ORDER — VANCOMYCIN HCL 2000 MG/400ML IV SOLN
2000.0000 mg | Freq: Two times a day (BID) | INTRAVENOUS | Status: DC
  Administered 2021-07-11 – 2021-07-15 (×8): 2000 mg via INTRAVENOUS
  Filled 2021-07-11 (×9): qty 400

## 2021-07-11 MED ORDER — ONDANSETRON HCL 4 MG/2ML IJ SOLN: 4.0000 mg | Freq: Once | INTRAMUSCULAR | Status: DC | PRN

## 2021-07-11 MED ORDER — LIDOCAINE HCL (CARDIAC) PF 100 MG/5ML IV SOSY
PREFILLED_SYRINGE | INTRAVENOUS | Status: DC | PRN
  Administered 2021-07-11: 100 mg via INTRAVENOUS

## 2021-07-11 MED ORDER — KETAMINE HCL 50 MG/5ML IJ SOSY
PREFILLED_SYRINGE | INTRAMUSCULAR | Status: AC
  Filled 2021-07-11: qty 5

## 2021-07-11 MED ORDER — MUPIROCIN 2 % EX OINT
TOPICAL_OINTMENT | Freq: Two times a day (BID) | CUTANEOUS | Status: DC
  Filled 2021-07-11: qty 22

## 2021-07-11 MED ORDER — CHLORHEXIDINE GLUCONATE 4 % EX LIQD: 60.0000 mL | Freq: Once | CUTANEOUS | Status: DC

## 2021-07-11 MED ORDER — 0.9 % SODIUM CHLORIDE (POUR BTL) OPTIME
TOPICAL | Status: DC | PRN
  Administered 2021-07-11: 500 mL

## 2021-07-11 MED ORDER — ONDANSETRON HCL 4 MG/2ML IJ SOLN
INTRAMUSCULAR | Status: AC
Start: 2021-07-11 — End: ?
  Filled 2021-07-11: qty 2

## 2021-07-11 MED ORDER — GLYCOPYRROLATE 0.2 MG/ML IJ SOLN
INTRAMUSCULAR | Status: AC
  Filled 2021-07-11: qty 1

## 2021-07-11 MED ORDER — KETAMINE HCL 10 MG/ML IJ SOLN
INTRAMUSCULAR | Status: DC | PRN
  Administered 2021-07-11: 20 mg via INTRAVENOUS
  Administered 2021-07-11 (×3): 10 mg via INTRAVENOUS

## 2021-07-11 SURGICAL SUPPLY — 58 items
BAG COUNTER SPONGE SURGICOUNT (BAG) ×2 IMPLANT
BLADE OSC/SAGITTAL MD 5.5X18 (BLADE) IMPLANT
BLADE OSCILLATING/SAGITTAL (BLADE)
BLADE SW THK.38XMED LNG THN (BLADE) IMPLANT
BNDG CONFORM 2 STRL LF (GAUZE/BANDAGES/DRESSINGS) IMPLANT
BNDG CONFORM 3 STRL LF (GAUZE/BANDAGES/DRESSINGS) IMPLANT
BNDG ELASTIC 3X5.8 VLCR NS LF (GAUZE/BANDAGES/DRESSINGS) IMPLANT
BNDG ELASTIC 4X5.8 VLCR NS LF (GAUZE/BANDAGES/DRESSINGS) IMPLANT
BNDG ESMARK 4X12 TAN STRL LF (GAUZE/BANDAGES/DRESSINGS) ×2 IMPLANT
BNDG GAUZE ELAST 4 BULKY (GAUZE/BANDAGES/DRESSINGS) ×2 IMPLANT
CANISTER WOUND CARE 500ML ATS (WOUND CARE) ×1 IMPLANT
CUFF TOURN SGL QUICK 12 (TOURNIQUET CUFF) IMPLANT
CUFF TOURN SGL QUICK 18X4 (TOURNIQUET CUFF) IMPLANT
DRAPE FLUOR MINI C-ARM 54X84 (DRAPES) IMPLANT
DRSG VAC ATS MED SENSATRAC (GAUZE/BANDAGES/DRESSINGS) ×1 IMPLANT
DURAPREP 26ML APPLICATOR (WOUND CARE) ×2 IMPLANT
ELECT REM PT RETURN 9FT ADLT (ELECTROSURGICAL) ×2
ELECTRODE REM PT RTRN 9FT ADLT (ELECTROSURGICAL) ×1 IMPLANT
GAUZE PACKING 1/4 X5 YD (GAUZE/BANDAGES/DRESSINGS) IMPLANT
GAUZE PACKING IODOFORM 1X5 (PACKING) IMPLANT
GAUZE SPONGE 4X4 12PLY STRL (GAUZE/BANDAGES/DRESSINGS) ×2 IMPLANT
GAUZE XEROFORM 1X8 LF (GAUZE/BANDAGES/DRESSINGS) ×2 IMPLANT
GLOVE SURG ENC MOIS LTX SZ7 (GLOVE) ×2 IMPLANT
GLOVE SURG UNDER LTX SZ7 (GLOVE) ×2 IMPLANT
GOWN STRL REUS W/ TWL LRG LVL3 (GOWN DISPOSABLE) ×2 IMPLANT
GOWN STRL REUS W/TWL LRG LVL3 (GOWN DISPOSABLE) ×2
HANDPIECE VERSAJET DEBRIDEMENT (MISCELLANEOUS) ×1 IMPLANT
IV NS 1000ML (IV SOLUTION)
IV NS 1000ML BAXH (IV SOLUTION) IMPLANT
IV NS IRRIG 3000ML ARTHROMATIC (IV SOLUTION) IMPLANT
KIT TURNOVER KIT A (KITS) ×2 IMPLANT
LABEL OR SOLS (LABEL) IMPLANT
MANIFOLD NEPTUNE II (INSTRUMENTS) ×2 IMPLANT
NDL FILTER BLUNT 18X1 1/2 (NEEDLE) ×1 IMPLANT
NDL HYPO 25X1 1.5 SAFETY (NEEDLE) ×2 IMPLANT
NEEDLE FILTER BLUNT 18X 1/2SAF (NEEDLE) ×1
NEEDLE FILTER BLUNT 18X1 1/2 (NEEDLE) ×1 IMPLANT
NEEDLE HYPO 25X1 1.5 SAFETY (NEEDLE) ×4 IMPLANT
NS IRRIG 500ML POUR BTL (IV SOLUTION) ×2 IMPLANT
PACK EXTREMITY ARMC (MISCELLANEOUS) ×2 IMPLANT
PAD ABD DERMACEA PRESS 5X9 (GAUZE/BANDAGES/DRESSINGS) ×2 IMPLANT
PULSAVAC PLUS IRRIG FAN TIP (DISPOSABLE)
RASP SM TEAR CROSS CUT (RASP) IMPLANT
SOL PREP PVP 2OZ (MISCELLANEOUS)
SOLUTION PREP PVP 2OZ (MISCELLANEOUS) IMPLANT
STOCKINETTE STRL 6IN 960660 (GAUZE/BANDAGES/DRESSINGS) ×2 IMPLANT
SUT ETHILON 3-0 FS-10 30 BLK (SUTURE) ×2
SUT ETHILON 4-0 (SUTURE)
SUT ETHILON 4-0 FS2 18XMFL BLK (SUTURE)
SUT VIC AB 3-0 SH 27 (SUTURE)
SUT VIC AB 3-0 SH 27X BRD (SUTURE) IMPLANT
SUT VIC AB 4-0 FS2 27 (SUTURE) IMPLANT
SUTURE EHLN 3-0 FS-10 30 BLK (SUTURE) IMPLANT
SUTURE ETHLN 4-0 FS2 18XMF BLK (SUTURE) IMPLANT
SWAB CULTURE AMIES ANAERIB BLU (MISCELLANEOUS) ×1 IMPLANT
SYR 10ML LL (SYRINGE) ×2 IMPLANT
TIP FAN IRRIG PULSAVAC PLUS (DISPOSABLE) IMPLANT
WATER STERILE IRR 500ML POUR (IV SOLUTION) ×2 IMPLANT

## 2021-07-11 NOTE — Anesthesia Procedure Notes (Signed)
Procedure Name: LMA Insertion ?Date/Time: 07/11/2021 2:15 PM ?Performed by: Morene Crocker, CRNA ?Pre-anesthesia Checklist: Patient identified, Patient being monitored, Timeout performed, Emergency Drugs available and Suction available ?Patient Re-evaluated:Patient Re-evaluated prior to induction ?Oxygen Delivery Method: Circle system utilized ?Preoxygenation: Pre-oxygenation with 100% oxygen ?Induction Type: IV induction ?Ventilation: Mask ventilation without difficulty ?LMA: LMA inserted ?LMA Size: 5.0 ?Tube type: Oral ?Number of attempts: 1 ?Placement Confirmation: positive ETCO2 and breath sounds checked- equal and bilateral ?Tube secured with: Tape ?Dental Injury: Teeth and Oropharynx as per pre-operative assessment  ? ? ? ? ?

## 2021-07-11 NOTE — Assessment & Plan Note (Deleted)
Patient has some iron deficiency, continue iron treatment, B12 normal. ?

## 2021-07-11 NOTE — Anesthesia Postprocedure Evaluation (Signed)
Anesthesia Post Note ? ?Patient: Archer Junior Larowe ? ?Procedure(s) Performed: incision and drainage right foot, wound debridements bilateral foot, application of wound vac right foot (Bilateral: Foot) ? ?Patient location during evaluation: PACU ?Anesthesia Type: General ?Level of consciousness: awake and alert ?Pain management: pain level controlled ?Vital Signs Assessment: post-procedure vital signs reviewed and stable ?Respiratory status: spontaneous breathing, nonlabored ventilation, respiratory function stable and patient connected to nasal cannula oxygen ?Cardiovascular status: blood pressure returned to baseline and stable ?Postop Assessment: no apparent nausea or vomiting ?Anesthetic complications: no ? ? ?No notable events documented. ? ? ?Last Vitals:  ?Vitals:  ? 07/11/21 1600 07/11/21 1616  ?BP: 127/72 (!) 157/97  ?Pulse:  88  ?Resp: 16 16  ?Temp: (!) 36.2 ?C (!) 36.1 ?C  ?SpO2: 98% 94%  ?  ?Last Pain:  ?Vitals:  ? 07/11/21 1600  ?TempSrc:   ?PainSc: 0-No pain  ? ? ?  ?  ?  ?  ?  ?  ? ?Martha Clan ? ? ? ? ?

## 2021-07-11 NOTE — Progress Notes (Signed)
?  Progress Note ? ? ?Patient: Alfred [name] [id number] DOB: 04/05/1966 DOA: 07/09/2021     2 ?DOS: the patient was seen and examined on 07/11/2021 ?  ?Brief hospital course: ?[name] is a 56 year old male with bilateral lower extremity lymphadenopathy, obesity, chronic bilateral lower extremity venous stasis, history of atrial flutter, on anticoagulation, history of Streptococcus group B bacteremia, who presents emergency department via EMS for chief concerns of bilateral lower extremity swelling. ? ?Upon arriving the hospital, he was found to have peak ulceration in left dorsal foot.  With associated cellulitis.  Patient was placed on antibiotics with vancomycin and cefepime. ? ?Assessment and Plan: ?* Foot ulcer ([place]) ?Continue antibiotics with cefepime and vancomycin for now.  MRI showed a subcutaneous abscess, he is scheduled for debridement by Dr. Luana Shu. ? ?Normocytic anemia ?Patient has some iron deficiency, start iron orally. ? ?Cellulitis of right foot ? ?MRI of the foot was performed last night, no evidence of osteomyelitis, has subcutaneous abscess.  Continue antibiotics with cefepime and vancomycin. ? ?Chronic diastolic CHF (congestive heart failure) ([facility]) ?Most recent echocardiogram was on 12/2019, poor quality due to morbid obesity.  Ejection fraction was estimated to be 55%.   ?Stable, no volume overload ? ?Lymphedema of both lower extremities ?Patient has a chronic lymphedema in bilateral lower extremities, this is a probably the source of ulceration.  We will obtain ABI to rule out peripheral vessel disease. ? ?Paroxysmal atrial flutter ([place]) ?Patient had EKG, confirm sinus rhythm.  Not currently on anticoagulation. ? ?Morbid obesity ([place]) ?Diet and exercise advised ? ? ? ? ?  ? ?Subjective:  ?Patient is stable, still has some pain on the foot.  Denies any short of breath or cough. ? ?Physical Exam: ?Vitals:  ? 07/10/21 1948 07/10/21 2330 07/11/21 0437 07/11/21 0803  ?BP: 136/63 131/66  138/75 (!) 146/79  ?Pulse: 89 87 82 77  ?Resp: 18 17 18 18   ?Temp: 98.6 ?F (37 ?C)  98.6 ?F (37 ?C) 98.6 ?F (37 ?C)  ?TempSrc:      ?SpO2: 96% 95% 96% 91%  ?Weight:      ?Height:      ? ?General exam: Appears calm and comfortable  ?Respiratory system: Clear to auscultation. Respiratory effort normal. ?Cardiovascular system: S1 & S2 heard, RRR. No JVD, murmurs, rubs, gallops or clicks. No pedal edema. ?Gastrointestinal system: Abdomen is nondistended, soft and nontender. No organomegaly or masses felt. Normal bowel sounds heard. ?Central nervous system: Alert and oriented. No focal neurological deficits. ?Extremities: Bilateral lower extremity chronic edema, left foot ulcer. ?Skin: No rashes, lesions or ulcers ?Psychiatry: Judgement and insight appear normal. Mood & affect appropriate.  ? ?Data Reviewed: ? ?Reviewed the lab results. ? ?Family Communication: Does not have phone number to call ? ?Disposition: ?Status is: Inpatient ?Remains inpatient appropriate because: Severity of disease, IV antibiotics, pending inpatient procedure. ? Planned Discharge Destination: Home with Home Health ? ? ? ?Time spent: 26 minutes ? ?Author: ?Sharen Hones, MD ?07/11/2021 11:46 AM ? ?For on call review [email or web address].  ?

## 2021-07-11 NOTE — Anesthesia Preprocedure Evaluation (Signed)
Anesthesia Evaluation  ?Patient identified by MRN, date of birth, ID band ?Patient awake ? ? ? ?Reviewed: ?Allergy & Precautions, NPO status , Patient's Chart, lab work & pertinent test results ? ?Airway ?Mallampati: II ? ?TM Distance: >3 FB ?Neck ROM: full ? ? ? Dental ? ?(+) Missing, Dental Advisory Given,  ?  ?Pulmonary ?neg pulmonary ROS, Current Smoker and Patient abstained from smoking.,  ?  ?Pulmonary exam normal ? ?+ decreased breath sounds ? ? ? ? ? Cardiovascular ?Exercise Tolerance: Poor ?+ DOE  ?negative cardio ROS ?Normal cardiovascular exam ?Rhythm:Regular Rate:Normal ? ? ?  ?Neuro/Psych ?negative neurological ROS ? negative psych ROS  ? GI/Hepatic ?negative GI ROS, Neg liver ROS,   ?Endo/Other  ?negative endocrine ROSMorbid obesity ? Renal/GU ?  ? ?  ?Musculoskeletal ? ? Abdominal ?(+) + obese,   ?Peds ?negative pediatric ROS ?(+)  Hematology ?negative hematology ROS ?(+) Blood dyscrasia, anemia ,   ?Anesthesia Other Findings ?Past Medical History: ?No date: Lower extremity edema ?11/27/2019: Morbid obesity (HCC) ?11/27/2019: Tobacco abuse ? ?Past Surgical History: ?No date: DEBRIDEMENT OF ABDOMINAL WALL ABSCESS ?12/07/2019: TEE WITHOUT CARDIOVERSION; N/A ?    Comment:  Procedure: TRANSESOPHAGEAL ECHOCARDIOGRAM (TEE);   ?             Surgeon: Antonieta Iba, MD;  Location: [facility] ORS;   ?             Service: Cardiovascular;  Laterality: N/A; ? ?BMI   ? Body Mass Index: 48.18 kg/m?  ?  ? ? Reproductive/Obstetrics ?negative OB ROS ? ?  ? ? ? ? ? ? ? ? ? ? ? ? ? ?  ?  ? ? ? ? ? ? ? ? ?Anesthesia Physical ?Anesthesia Plan ? ?ASA: 3 ? ?Anesthesia Plan: General  ? ?Post-op Pain Management:   ? ?Induction: Intravenous ? ?PONV Risk Score and Plan: Dexamethasone, Ondansetron, Midazolam and Treatment may vary due to age or medical condition ? ?Airway Management Planned: LMA ? ?Additional Equipment:  ? ?Intra-op Plan:  ? ?Post-operative Plan: Extubation in OR ? ?Informed Consent:  I have reviewed the patients History and Physical, chart, labs and discussed the procedure including the risks, benefits and alternatives for the proposed anesthesia with the patient or authorized representative who has indicated his/her understanding and acceptance.  ? ? ? ?Dental Advisory Given ? ?Plan Discussed with: CRNA and Surgeon ? ?Anesthesia Plan Comments:   ? ? ? ? ? ? ?Anesthesia Quick Evaluation ? ?

## 2021-07-11 NOTE — Plan of Care (Signed)

## 2021-07-11 NOTE — Progress Notes (Addendum)
Pharmacy Antibiotic Note ? ?Alfred [name] is a 56 y.o. male admitted on 07/09/2021 with osteomyelitis/wound infection. Pharmacy has been consulted for Cefepime and Vancomycin dosing. ? ?Plan: ?Pt ordered total initial loading dose of Vancomycin 2500 mg ?Will decrease vancomycin dose to 2000 mg IV Q12 hrs.  ?Goal AUC 400-550  ?Est AUC: 423.8 ?Est Cmax: 29.9 ?Est Cmin: 11.3 ?Calculated with SCr 1 (to more closely mimic a normalized CrCl), Vd 0.5 ? ?Cefepime 2 gm q8h per indication and renal function  ? ?Monitor clinical picture, renal function, and vancomycin levels at steady state  ?F/U C&S, abx deescalation / LOT ? ? ?Height: 6\' 7"  (200.7 cm) ?Weight: (!) 194 kg (427 lb 11.1 oz) ?IBW/kg (Calculated) : 93.7 ? ?Temp (24hrs), Avg:98.5 ?F (36.9 ?C), Min:98.1 ?F (36.7 ?C), Max:98.6 ?F (37 ?C) ? ?Recent Labs  ?Lab 07/09/21 ?2139 07/10/21 ?[id number] 07/11/21 ?0447  ?WBC 6.5 7.3  --   ?CREATININE 0.75 0.66 0.70  ? ?  ?Estimated Creatinine Clearance: 199.8 mL/min (by C-G formula based on SCr of 0.7 mg/dL).   ? ?No Known Allergies ? ?Antimicrobials this admission: ?4/05 Vancomycin >>  ?4/06 Cefepime >>  ? ?Microbiology results: ?4/5 BCx: NG x 2 days  ?4/5 MRSA PCR: Positive  ? ?Thank you for allowing pharmacy to be a part of this patient?s care. ? ?Darnelle Bos, PharmD ?07/11/2021 ?12:18 PM ? ? ?

## 2021-07-11 NOTE — Op Note (Addendum)
PODIATRY / FOOT AND ANKLE SURGERY OPERATIVE REPORT ? ? ? ?SURGEON: Caroline More, DPM ? ?PRE-OPERATIVE DIAGNOSIS:  ?1.  Right foot abscess, subcutaneous ?2.  Right dorsal foot wound measures 3 cm x 3.5 cm x 1 cm, right dorsal lateral hallux ulceration measures 1 cm x 0.5 cm x 0.1 cm ?3.  Left lower leg ulcerations ?4.  Chronic venous insufficiency/lymphedema severe ?5.  PVD ?6.  Onychomycosis ? ?POST-OPERATIVE DIAGNOSIS: Same ? ?PROCEDURE(S): ?Right foot incision and drainage ?100% excisional wound debridement to the level of tendon to the dorsal right foot ?100% excisional subcutaneous wound debridement to right hallux and left lower leg wounds x2 ?Application wound VAC right foot ?5.  Nail debridement x10  ? ?HEMOSTASIS: No tourniquet ? ?ANESTHESIA: MAC ? ?ESTIMATED BLOOD LOSS: 30 cc ? ?FINDING(S): ?1.  Abscess right medial foot near the navicular tuberosity area within the subcutaneous tissue ?2.  Right dorsal foot wound with tendon/muscle exposed, no bone exposed ? ?PATHOLOGY/SPECIMEN(S): Right foot wound culture ? ?INDICATIONS:   ?Alfred [name] is a 56 y.o. male who presents with a grossly contaminated right dorsal foot wound and abscess present to the right medial foot seen on MRI.  Patient also has a history of chronic leg ulcerations due to severe lymphedema and venous insufficiency.  Patient had ABIs which also showed peripheral vascular disease.  Patient had consultation with vascular who had recommended that patient undergo procedure today to clean his wounds and for incision and drainage and they believe that he has enough circulation to heal this type of procedure.  Discussed all treatment options with the patient both conservative and surgical attempts at correction clean potential risks and complications at this time patient is elected for surgical intervention described above.. ? ?DESCRIPTION: ?After obtaining full informed written consent, the patient was brought back to the operating room and  placed supine upon the operating table.  The patient received IV antibiotics prior to induction.  After obtaining adequate anesthesia, 20 cc of half percent Marcaine plain was injected about the wound to the dorsal aspect of the right foot and about the right medial foot around the abscess area.  The patient was prepped and draped in the standard fashion. ? ?Attention was directed to the right medial foot where a fluctuant area was able to be palpable.  There appeared to be a callus formation over this area but no obvious openings.  A 3 cm linear longitudinal incision was made over the area of fluctuance.  There appeared to be immediate release of purulent fluid from the area.  The area appeared to probe near the area of the callus formation.  The callus was removed and did not reveal any ulceration present but the wound probed to this area and yet more purulence was able to be expressed so another small percutaneous incision was made that was about a centimeter in this area.  The 2 cm were connected underneath the skin with a hemostat releasing more purulent fluid.  The surgical site was flushed with copious amounts normal sterile saline and the tissues were inspected.  There did not appear to be any necrosis present and only appeared to be within the subcutaneous tissue.  The tissues were debrided to healthy bleeding tissue.  At this time iodoform packing gauze was then placed into the wound connecting 1 incision to the other.  The skin was then partially closed with 3-0 nylon in simple type stitching. ? ?Attention was then directed to the dorsal aspect of the right  foot where a 100% excisional debridement was performed into muscle/tendon, the predebridement measurement measured the same as postdebridement which was 3 cm x 3.5 cm x 1 cm removing all nonviable necrotic tissue as well as fibrous tissue and biofilm.  The wound base appeared to be fairly healthy and granular after debridement.  Debridement was  performed with combination of 15 blade, curette, and Versajet.  Hemostasis was achieved with both compression and electrocauterization.  After this was performed wound VAC was then applied per standard technique to the dorsal aspect of the right foot with excellent seal noted.  The wound VAC was started and no leaks were noted. ? ?Attention was directed to the right hallux where the wound was debrided in a 100% excisional subcutaneous manner into subcutaneous tissue, predebridement measurement measured same as postdebridement which measured 1 cm x 0.5 cm x 0.1 cm.  This was performed with Versajet and 15 blade removing all nonviable and necrotic tissue including fibrous tissue and some hyperkeratotic tissue around the periphery.  Hemostasis was achieved with compression.  The wound base appeared to be fairly healthy and granular after debridement. ? ?Attention was directed to the left lower leg medially around the mid tibial area where 2 small subcutaneous ulcerations were present, the most proximal measured predebridement and postdebridement at 0.5 x 0.5 x 0.1 cm, the slightly more distal wound measured approximately 0.7 x 0.5 x 0.3 cm predebridement and postdebridement.  100% excisional subcutaneous wound debridements performed to both of these wounds removing all nonviable necrotic tissue including fibrous tissue and hyperkeratotic tissue to healthy bleeding granular base, this was performed with Versajet and 15 blade as well as curette.  Hemostasis was achieved with compression. ? ?Betadine soaked gauze was then weaved between the toes due to some maceration that was present to these areas.  Betadine soaked 4 x 4's were then placed on areas of ulcerated tissue to the right hallux and left lower leg.  4 x 4 gauze were then applied followed by Kerlix and Ace wrap.  Nail debridement performed x10 without incident with sterile nail nipper, patient tolerated well. ? ?Patient tolerated the procedure and anesthesia  well was transferred to recovery room vital signs stable vascular status appearing to be intact to all digits of both feet.  Patient will be discharged back to the inpatient room with the appropriate orders and instructions.  Patient is to have the wound VAC changed 3 times weekly to the right foot.  Patient is to also have the dressing changed to the right foot every other day for now removing the packing gauze and applying fresh bandaging.  Patient should have lower legs wrapped from the toes to below the knee with compression to try to control swelling further.  Wound care instructions placed in chart and consultation placed with wound care with specifics.  Would recommend placement of Unna boots weekly along with wound VAC changes 3 times weekly.  This will be difficult to do as patient does not have insurance.  If this cannot be accomplished then would recommend saline wet-to-dry dressings performed to the dorsal right foot wound daily.  Will re-eval on Monday. ? ?COMPLICATIONS: None ? ?CONDITION: Good, stable ? ?Caroline More, DPM ? ?

## 2021-07-11 NOTE — Transfer of Care (Signed)
Immediate Anesthesia Transfer of Care Note ? ?Patient: Alfred [name] ? ?Procedure(s) Performed: incision and drainage right foot, wound debridements bilateral foot, application of wound vac right foot (Bilateral: Foot) ? ?Patient Location: PACU ? ?Anesthesia Type:General ? ?Level of Consciousness: drowsy ? ?Airway & Oxygen Therapy: Patient Spontanous Breathing and Patient connected to face mask oxygen ? ?Post-op Assessment: Report given to RN and Post -op Vital signs reviewed and stable ? ?Post vital signs: Reviewed and stable ? ?Last Vitals:  ?Vitals Value Taken Time  ?BP 134/78 07/11/21 1538  ?Temp 36.1 ?C 07/11/21 1538  ?Pulse 75 07/11/21 1542  ?Resp 18 07/11/21 1542  ?SpO2 100 % 07/11/21 1542  ?Vitals shown include unvalidated device data. ? ?Last Pain:  ?Vitals:  ? 07/11/21 1538  ?TempSrc:   ?PainSc: Asleep  ?   ? ?Patients Stated Pain Goal: 0 (07/11/21 1335) ? ?Complications: No notable events documented. ?

## 2021-07-11 NOTE — H&P (Signed)
HISTORY AND PHYSICAL INTERVAL NOTE: ? ?07/11/2021 ? ?1:55 PM ? ?Alfred [name]  has presented today for surgery, with the diagnosis of right foot abscess, BLE ulcerations, lymphedema, PVD.  The various methods of treatment have been discussed with the patient.  No guarantees were given.  After consideration of risks, benefits and other options for treatment, the patient has consented to surgery.  I have reviewed the patients? chart and labs.  ? ?PROCEDURE: ?RIGHT FOOT INCISION AND DRAINAGE ?FOOT AND LOWER LEG ULCERATION DEBRIDEMENTS ?POSSIBLE APPLICATION OF WOUND VAC RIGHT FOOT  ? ? ?A history and physical examination was performed in the hospital.  The patient was reexamined.  There have been no changes to this history and physical examination. ? ?Rosetta Posner, DPM ? ?

## 2021-07-12 ENCOUNTER — Encounter: Payer: Self-pay | Admitting: Podiatry

## 2021-07-12 DIAGNOSIS — L02611 Cutaneous abscess of right foot: Secondary | ICD-10-CM

## 2021-07-12 DIAGNOSIS — L03031 Cellulitis of right toe: Secondary | ICD-10-CM

## 2021-07-12 LAB — CBC WITH DIFFERENTIAL/PLATELET
Abs Immature Granulocytes: 0.05 10*3/uL (ref 0.00–0.07)
Basophils Absolute: 0 10*3/uL (ref 0.0–0.1)
Basophils Relative: 0 %
Eosinophils Absolute: 0 10*3/uL (ref 0.0–0.5)
Eosinophils Relative: 0 %
HCT: 31.5 % — ABNORMAL LOW (ref 39.0–52.0)
Hemoglobin: 9.2 g/dL — ABNORMAL LOW (ref 13.0–17.0)
Immature Granulocytes: 1 %
Lymphocytes Relative: 6 %
Lymphs Abs: 0.5 10*3/uL — ABNORMAL LOW (ref 0.7–4.0)
MCH: 19.9 pg — ABNORMAL LOW (ref 26.0–34.0)
MCHC: 29.2 g/dL — ABNORMAL LOW (ref 30.0–36.0)
MCV: 68 fL — ABNORMAL LOW (ref 80.0–100.0)
Monocytes Absolute: 0.2 10*3/uL (ref 0.1–1.0)
Monocytes Relative: 2 %
Neutro Abs: 6.7 10*3/uL (ref 1.7–7.7)
Neutrophils Relative %: 91 %
Platelets: 262 10*3/uL (ref 150–400)
RBC: 4.63 MIL/uL (ref 4.22–5.81)
RDW: 18.7 % — ABNORMAL HIGH (ref 11.5–15.5)
WBC: 7.5 10*3/uL (ref 4.0–10.5)
nRBC: 0 % (ref 0.0–0.2)

## 2021-07-12 LAB — BASIC METABOLIC PANEL
Anion gap: 8 (ref 5–15)
BUN: 8 mg/dL (ref 6–20)
CO2: 25 mmol/L (ref 22–32)
Calcium: 8.6 mg/dL — ABNORMAL LOW (ref 8.9–10.3)
Chloride: 102 mmol/L (ref 98–111)
Creatinine, Ser: 0.63 mg/dL (ref 0.61–1.24)
GFR, Estimated: >60 mL/min (ref >60)
Glucose, Bld: 135 mg/dL — ABNORMAL HIGH (ref 70–99)
Potassium: 4.2 mmol/L (ref 3.5–5.1)
Sodium: 135 mmol/L (ref 135–145)

## 2021-07-12 LAB — MAGNESIUM: Magnesium: 1.8 mg/dL (ref 1.7–2.4)

## 2021-07-12 MED ORDER — ALUM & MAG HYDROXIDE-SIMETH 200-200-20 MG/5ML PO SUSP: 30.0000 mL | Freq: Four times a day (QID) | ORAL | Status: DC | PRN

## 2021-07-12 MED ORDER — SENNOSIDES-DOCUSATE SODIUM 8.6-50 MG PO TABS
1.0000 | ORAL_TABLET | Freq: Two times a day (BID) | ORAL | Status: DC
  Administered 2021-07-12 (×2): 1 via ORAL
  Filled 2021-07-12 (×2): qty 1

## 2021-07-12 NOTE — Plan of Care (Signed)

## 2021-07-12 NOTE — Progress Notes (Signed)
PODIATRY / FOOT AND ANKLE SURGERY PROGRESS NOTE ? ?Requesting Physician: Dr. Roosevelt Locks ? ?Reason for consult: Foot/leg wounds, abscess ? ?Chief Complaint: Leg swelling and wounds ?  ?HPI: Alfred [name] is a 56 y.o. male who presents today resting in bed comfortably.  Patient denies much pain or discomfort to his legs or feet.  He has kept his dressings clean and intact since surgery.  Patient has had no problems with the wound VAC on his right foot currently. ? ?PMHx:  ?Past Medical History:  ?Diagnosis Date  ? Lower extremity edema   ? Morbid obesity ([place]) 11/27/2019  ? Tobacco abuse 11/27/2019  ? ? ?Surgical Hx:  ?Past Surgical History:  ?Procedure Laterality Date  ? DEBRIDEMENT OF ABDOMINAL WALL ABSCESS    ? IRRIGATION AND DEBRIDEMENT FOOT Bilateral 07/11/2021  ? Procedure: incision and drainage right foot, wound debridements bilateral foot, application of wound vac right foot;  Surgeon: Caroline More, DPM;  Location: [facility] ORS;  Service: Podiatry;  Laterality: Bilateral;  ? TEE WITHOUT CARDIOVERSION N/A 12/07/2019  ? Procedure: TRANSESOPHAGEAL ECHOCARDIOGRAM (TEE);  Surgeon: Minna Merritts, MD;  Location: [facility] ORS;  Service: Cardiovascular;  Laterality: N/A;  ? ? ?FHx:  ?Family History  ?Problem Relation Age of Onset  ? Heart disease Mother   ?     died in her 20's  ? Heart disease Father   ?     died in his 25's  ? ? ?Social History:  reports that he has been smoking cigarettes. He has been smoking an average of .1 packs per day. He has never used smokeless tobacco. He reports current alcohol use. He reports current drug use. Drug: Marijuana. ? ?Allergies: No Known Allergies ? ? ?Medications Prior to Admission  ?Medication Sig Dispense Refill  ? acetaminophen (TYLENOL) 325 MG tablet Take 2 tablets (650 mg total) by mouth every 6 (six) hours as needed for mild pain. (Patient not taking: Reported on 07/10/2021)    ? amiodarone (PACERONE) 200 MG tablet Take 1 tablet (200 mg total) by mouth daily. (Patient not taking:  Reported on 07/10/2021) 30 tablet 0  ? amiodarone (PACERONE) 200 MG tablet Take 1 tablet (200 mg total) by mouth 2 (two) times daily for 3 doses. 3 tablet 0  ? apixaban (ELIQUIS) 5 MG TABS tablet Take 1 tablet (5 mg total) by mouth 2 (two) times daily. (Patient not taking: Reported on 07/10/2021) 60 tablet 0  ? potassium chloride SA (KLOR-CON) 10 MEQ tablet Take 1 tablet (10 mEq total) by mouth daily. (Patient not taking: Reported on 07/10/2021) 30 tablet 0  ? torsemide (DEMADEX) 20 MG tablet Take 2 tablets (40 mg total) by mouth daily. (Patient not taking: Reported on 07/10/2021) 120 tablet 0  ? torsemide (DEMADEX) 20 MG tablet Take 2 tablets (40 mg total) by mouth daily as needed (Take 2 tablets in the evening as needed for leg swelling or weight gain of more than 3 pounds). (Patient not taking: Reported on 07/10/2021)    ? ? ?Physical Exam: ?General: Alert and oriented.  No apparent distress. ? ?Vascular: DP/PT pulses nonpalpable bilateral likely secondary to swelling.  Severe lymphedema present to bilateral lower extremities. ? ?Neuro: Light touch sensation diminished to both feet. ? ?Derm: Right dorsal foot wound appears to have wound VAC in place with excellent seal, no leaking. ? ?Incision made to the right medial instep area appears to be well coapted, packing still in place, no active drainage noted, once packing was removed did not reveal  any further drainage.  Decreased erythema present to this area. ? ?Severe lichenification of skin noted to bilateral lower extremities. ? ?MSK: Mild pain on palpation to the right foot. ? ?Results for orders placed or performed during the hospital encounter of 07/09/21 (from the past 48 hour(s))  ?Creatinine, serum     Status: None  ? Collection Time: 07/11/21  4:47 AM  ?Result Value Ref Range  ? Creatinine, Ser 0.70 0.61 - 1.24 mg/dL  ? GFR, Estimated >60 >60 mL/min  ?  Comment: (NOTE) ?Calculated using the CKD-EPI Creatinine Equation (2021) ?Performed at [facility],  [place], ?[place] [address] ?  ?Iron and TIBC     Status: Abnormal  ? Collection Time: 07/11/21  8:28 AM  ?Result Value Ref Range  ? Iron 41 (L) 45 - 182 ug/dL  ? TIBC 378 250 - 450 ug/dL  ? Saturation Ratios 11 (L) 17.9 - 39.5 %  ? UIBC 337 ug/dL  ?  Comment: Performed at [facility], [address], [place], [place] [address]  ?Vitamin B12     Status: None  ? Collection Time: 07/11/21  8:28 AM  ?Result Value Ref Range  ? Vitamin B-12 454 180 - 914 pg/mL  ?  Comment: (NOTE) ?This assay is not validated for testing neonatal or ?myeloproliferative syndrome specimens for Vitamin B12 levels. ?Performed at [facility], [place] [address], [place], [place] ?[address] ?  ?Ferritin     Status: None  ? Collection Time: 07/11/21  8:28 AM  ?Result Value Ref Range  ? Ferritin 99 24 - 336 ng/mL  ?  Comment: Performed at [facility], [address] [place], [place] [address]  ?Aerobic/Anaerobic Culture w Gram Stain (surgical/deep wound)     Status: None (Preliminary result)  ? Collection Time: 07/11/21  3:05 PM  ? Specimen: PATH Soft tissue  ?Result Value Ref Range  ? Specimen Description    ?  FOOT RIGHT ?Performed at [facility], [address] [place], [place] [address] ?  ? Special Requests    ?  NONE ?Performed at [facility], [address], [place], [place] [address] ?  ? Gram Stain    ?  NO SQUAMOUS EPITHELIAL CELLS SEEN ?FEW WBC SEEN ?FEW GRAM POSITIVE COCCI ?  ? Culture    ?  CULTURE REINCUBATED FOR BETTER GROWTH ?Performed at [facility], [place] [address] [place], [place] [address] ?  ? Report Status PENDING   ?CBC with Differential/Platelet     Status: Abnormal  ? Collection Time: 07/12/21  4:43 AM  ?Result Value Ref Range  ? WBC 7.5 4.0 - 10.5 K/uL  ? RBC 4.63 4.22 - 5.81 MIL/uL  ? Hemoglobin 9.2 (L) 13.0 - 17.0 g/dL  ? HCT 31.5 (L) 39.0 - 52.0 %  ? MCV 68.0 (L) 80.0 - 100.0 fL  ? MCH 19.9 (L) 26.0 - 34.0 pg  ? MCHC 29.2 (L) 30.0 - 36.0 g/dL  ?  RDW 18.7 (H) 11.5 - 15.5 %  ? Platelets 262 150 - 400 K/uL  ? nRBC 0.0 0.0 - 0.2 %  ? Neutrophils Relative % 91 %  ? Neutro Abs 6.7 1.7 - 7.7 K/uL  ? Lymphocytes Relative 6 %  ? Lymphs Abs 0.5 (L) 0.7 - 4.0 K/uL  ? Monocytes Relative 2 %  ? Monocytes Absolute 0.2 0.1 - 1.0 K/uL  ? Eosinophils Relative 0 %  ? Eosinophils Absolute 0.0 0.0 -  0.5 K/uL  ? Basophils Relative 0 %  ? Basophils Absolute 0.0 0.0 - 0.1 K/uL  ? WBC Morphology MORPHOLOGY UNREMARKABLE   ? Immature Granulocytes 1 %  ? Abs Immature Granulocytes 0.05 0.00 - 0.07 K/uL  ? Tear Drop Cells PRESENT   ? Burr Cells PRESENT   ?  Comment: Performed at [facility], [address], [place], [place] [address]  ?Basic metabolic panel     Status: Abnormal  ? Collection Time: 07/12/21  4:43 AM  ?Result Value Ref Range  ? Sodium 135 135 - 145 mmol/L  ? Potassium 4.2 3.5 - 5.1 mmol/L  ? Chloride 102 98 - 111 mmol/L  ? CO2 25 22 - 32 mmol/L  ? Glucose, Bld 135 (H) 70 - 99 mg/dL  ?  Comment: Glucose reference range applies only to samples taken after fasting for at least 8 hours.  ? BUN 8 6 - 20 mg/dL  ? Creatinine, Ser 0.63 0.61 - 1.24 mg/dL  ? Calcium 8.6 (L) 8.9 - 10.3 mg/dL  ? GFR, Estimated >60 >60 mL/min  ?  Comment: (NOTE) ?Calculated using the CKD-EPI Creatinine Equation (2021) ?  ? Anion gap 8 5 - 15  ?  Comment: Performed at [facility], [address] [place], [place] [address]  ?Magnesium     Status: None  ? Collection Time: 07/12/21  4:43 AM  ?Result Value Ref Range  ? Magnesium 1.8 1.7 - 2.4 mg/dL  ?  Comment: Performed at [facility], [address] [place], [place] [address]  ? ?No results found. ? ?Blood pressure (!) 120/95, pulse 85, temperature 98.2 ?F (36.8 ?C), resp. rate 18, height 6\' 7"  (2.007 m), weight (!) 194 kg, SpO2 95 %. ? ?Assessment ?Abscess right foot status post incision and drainage ?Right dorsal foot ulceration status post wound debridement with wound VAC application ?Bilateral lower extremity  venous insufficiency/lymphedema wounds superficial dermal status postdebridement ?PVD ? ?Plan ?-Patient seen and examined. ?-Packing removed from the right foot wound without incident.  Wound VAC appears to

## 2021-07-12 NOTE — Progress Notes (Signed)
?  Progress Note ? ? ?Patient: Alfred [name] CBJ:[id number] DOB: Mar 24, 1966 DOA: 07/09/2021     3 ?DOS: the patient was seen and examined on 07/12/2021 ?  ?Brief hospital course: ?[name] is a 56 year old male with bilateral lower extremity lymphadenopathy, obesity, chronic bilateral lower extremity venous stasis, history of atrial flutter, on anticoagulation, history of Streptococcus group B bacteremia, who presents emergency department via EMS for chief concerns of bilateral lower extremity swelling. ? ?Upon arriving the hospital, he was found to have peak ulceration in left dorsal foot.  With associated cellulitis.  Patient was placed on antibiotics with vancomycin and cefepime. ? ?MRI did not show osteomyelitis, but showed abscess.  I&D performed on 4/7. ? ?Assessment and Plan: ?* Foot ulcer (HCC) ?Status post I&D for abscess. ? ?Cellulitis and abscess of toe of right foot ?Patient is status post I&D, has a wound VAC.  Continue current antibiotics and while pending culture results.  Will change to oral antibiotics tomorrow when culture results available. ? ?Normocytic anemia ?Patient has some iron deficiency, continue iron treatment, B12 normal. ? ?Chronic diastolic CHF (congestive heart failure) (HCC) ?Most recent echocardiogram was on 12/2019, poor quality due to morbid obesity.  Ejection fraction was estimated to be 55%.   ?Patient does not have any volume overload at this time. ? ?Lymphedema of both lower extremities ?Patient has a chronic lymphedema in bilateral lower extremities, this is a probably the source of ulceration.  ABI he has mild peripheral vessel disease.  Patient has been evaluated by vascular surgery, currently has no plan for intervention. ? ?Paroxysmal atrial flutter (HCC) ?Currently in sinus rhythm.  Not currently on anticoagulation. ? ?Morbid obesity (HCC) ?Diet and exercise advised ? ? ? ? ?  ? ?Subjective:  ?Patient feels much better today, less pain in the foot. ?Able to walk  a few steps. ? ?Physical Exam: ?Vitals:  ? 07/11/21 1635 07/11/21 2007 07/12/21 0422 07/12/21 0745  ?BP: (!) 151/95 136/72 (!) 158/83 (!) 120/95  ?Pulse: 71 80 76 85  ?Resp: 18 16 16 18   ?Temp: 97.6 ?F (36.4 ?C) 98.1 ?F (36.7 ?C) 98.1 ?F (36.7 ?C) 98.2 ?F (36.8 ?C)  ?TempSrc:      ?SpO2: 94% 98% 99% 95%  ?Weight:      ?Height:      ? ?General exam: Appears calm and comfortable  ?Respiratory system: Clear to auscultation. Respiratory effort normal. ?Cardiovascular system: S1 & S2 heard, RRR. No JVD, murmurs, rubs, gallops or clicks. No pedal edema. ?Gastrointestinal system: Abdomen is nondistended, soft and nontender. No organomegaly or masses felt. Normal bowel sounds heard. ?Central nervous system: Alert and oriented. No focal neurological deficits. ?Extremities: Bilateral lower extremity chronic lymphedema. ?Skin: No rashes, lesions or ulcers ?Psychiatry: Judgement and insight appear normal. Mood & affect appropriate.  ? ?Data Reviewed: ? ?Lab results reviewed.  Wound culture still pending. ? ?Family Communication: No family listed. ? ?Disposition: ?Status is: Inpatient ?Remains inpatient appropriate because: Severity of disease.  Plan discharge tomorrow. ? Planned Discharge Destination: Home with Home Health ? ? ? ?Time spent: 28 minutes ? ?Author: ? , MD ?07/12/2021 10:48 AM ? ?For on call review [email or web address].  ?

## 2021-07-12 NOTE — Assessment & Plan Note (Addendum)
Patient is status post I&D, has a wound VAC.   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  MRSA susceptible to doxycycline.  We will continue doxycycline and Augmentin for 7 more days.  Patient will follow up with home nurse to take care of the wound and wound VAC.  Follow-up with Dr. Excell Seltzer as outpatient ?

## 2021-07-13 LAB — CREATININE, SERUM
Creatinine, Ser: 0.65 mg/dL (ref 0.61–1.24)
GFR, Estimated: >60 mL/min (ref >60)

## 2021-07-13 MED ORDER — LACTULOSE 10 GM/15ML PO SOLN
20.0000 g | Freq: Once | ORAL | Status: AC
  Administered 2021-07-13: 20 g via ORAL
  Filled 2021-07-13: qty 30

## 2021-07-13 MED ORDER — ACETAMINOPHEN 500 MG PO TABS
1000.0000 mg | ORAL_TABLET | Freq: Four times a day (QID) | ORAL | Status: DC | PRN
Start: 2021-07-13 — End: 2021-07-16
  Administered 2021-07-13 – 2021-07-14 (×3): 1000 mg via ORAL
  Filled 2021-07-13 (×3): qty 2

## 2021-07-13 MED ORDER — SENNOSIDES-DOCUSATE SODIUM 8.6-50 MG PO TABS
2.0000 | ORAL_TABLET | Freq: Two times a day (BID) | ORAL | Status: DC
  Administered 2021-07-13 – 2021-07-15 (×5): 2 via ORAL
  Filled 2021-07-13 (×5): qty 2

## 2021-07-13 NOTE — Consult Note (Signed)
WOC Nurse Consult Note: ?Reason for Consult:s/p surgical debridement of abscess to right foot by Podiatry (Dr. Rebecka Apley) ?Wound type:Infectious ?Pressure Injury POA: N/A ?Measurement: 3cm x 3.5cm x 1cm ? ?Discussed with Dr. Excell Seltzer yesterday his orders for Unna's boots over NPWT on Monday and he indicates that a compression wrap will suffice.  ?He and I will attempt to see patient together on Monday for first surgical dressing change. If this is not possible, WOC Nurse to perform NPWT dressing change. ? ?WOC nursing team will follow while in house for NPWT dressing changes on M/W/F and will remain available to this patient, the nursing and medical teams.   ?Thanks, ?Ladona Mow, MSN, RN, GNP, CWOCN, CWON-AP, FAAN  ?Pager# [phone]  ? ? ?  ?

## 2021-07-13 NOTE — TOC Progression Note (Signed)
Transition of Care (TOC) - Progression Note  ? ? ?Patient Details  ?Name: Alfred [name] ?MRN: [id number] ?Date of Birth: [birth year] ? ?Transition of Care (TOC) CM/SW Contact  ?Bing Quarry, RN ?Phone Number: ?07/13/2021, 3:27 PM ? ?Clinical Narrative:  4/9: Reached out to [facility] on charity rotation ([place]) via Inetta Fermo, who is on call for [place] [facility] this weekend. Left information and weekday CSW call back number. Charity rotation will change to [place] on Tuesday, 07/16/21. Patient will need wound care/wound vac for several weeks. PCP will need to be obtained for [facility] services as well. TOC to follow up with charity/PCP/medication needs. Gabriel Cirri RN CM  ? ? ? ?  ?  ? ?Expected Discharge Plan and Services ?  ?  ?  ?  ?  ?                ?  ?  ?  ?  ?  ?  ?  ?  ?  ?  ? ? ?Social Determinants of Health (SDOH) Interventions ?  ? ?Readmission Risk Interventions ?   ? View : No data to display.  ?  ?  ?  ? ? ?

## 2021-07-13 NOTE — Progress Notes (Signed)
?  Progress Note ? ? ?Patient: Alfred [name] [id number] DOB: [birth year] DOA: 07/09/2021     4 ?DOS: the patient was seen and examined on 07/13/2021 ?  ?Brief hospital course: ?Mr. Maddix Leben is a 56 year old male with bilateral lower extremity lymphadenopathy, obesity, chronic bilateral lower extremity venous stasis, history of atrial flutter, on anticoagulation, history of Streptococcus group B bacteremia, who presents emergency department via EMS for chief concerns of bilateral lower extremity swelling. ? ?Upon arriving the hospital, he was found to have peak ulceration in left dorsal foot.  With associated cellulitis.  Patient was placed on antibiotics with vancomycin and cefepime. ? ?MRI did not show osteomyelitis, but showed abscess.  I&D performed on 4/7. ? ?Assessment and Plan: ?* Foot ulcer ([place]) ?Status post I&D for abscess. ?Discussed with Dr. Luana Shu, patient eventually will need skin graft after treatment of infection. ? ?Cellulitis and abscess of toe of right foot ?Patient is status post I&D, has a wound VAC.  Wound culture has a few gram-positive cocci, final results still pending.  Discussed with Dr. Luana Shu, patient will need set up home care, ideally managing the wound VAC. ?Discussed with case management, set up home health probably will take a Monday to Tuesday. ?Continue antibiotics with vancomycin and cefepime until the final culture results available. ? ?Normocytic anemia ?Patient has some iron deficiency, continue iron treatment, B12 normal. ? ?Chronic diastolic CHF (congestive heart failure) ([place]) ?Most recent echocardiogram was on 12/2019, poor quality due to morbid obesity.  Ejection fraction was estimated to be 55%.   ?Condition stable, no CHF exacerbation. ? ?Lymphedema of both lower extremities ?Patient has a chronic lymphedema in bilateral lower extremities, this is a probably the source of ulceration.  ABI he has mild peripheral vessel disease.  Patient has been evaluated by vascular  surgery, currently has no plan for intervention. ? ?Paroxysmal atrial flutter ([place]) ?Still in sinus rhythm. ? ?Morbid obesity ([place]) ?Diet and exercise advised ? ? ? ? ?  ? ?Subjective:  ?Patient had a complaining of foot pain, tramadol was started last night.  Denies any short of breath or cough ? ?Physical Exam: ?Vitals:  ? 07/12/21 1957 07/13/21 0358 07/13/21 0359 07/13/21 0804  ?BP:   133/80 (!) 141/84  ?Pulse: 75  67 67  ?Resp: 18  20 17   ?Temp: 98.3 ?F (36.8 ?C) 98 ?F (36.7 ?C)  98.1 ?F (36.7 ?C)  ?TempSrc:  Oral    ?SpO2: 98%  99% 100%  ?Weight:      ?Height:      ? ?General exam: Appears calm and comfortable, morbid obesity. ?Respiratory system: Clear to auscultation. Respiratory effort normal. ?Cardiovascular system: S1 & S2 heard, RRR. No JVD, murmurs, rubs, gallops or clicks. No pedal edema. ?Gastrointestinal system: Abdomen is nondistended, soft and nontender. No organomegaly or masses felt. Normal bowel sounds heard. ?Central nervous system: Alert and oriented. No focal neurological deficits. ?Extremities: foot ulcer, bilateral chronic leg edema. ?Skin: No rashes, lesions or ulcers ?Psychiatry: Judgement and insight appear normal. Mood & affect appropriate.  ? ?Data Reviewed: ? ?Culture results reviewed, other lab results also reviewed again ? ?Family Communication:  ? ?Disposition: ?Status is: Inpatient ?Remains inpatient appropriate because: Severity of disease, iv antibiotics use. ? Planned Discharge Destination: Home with Home Health ? ? ? ?Time spent: 28 minutes ? ?Author: ?Sharen Hones, MD ?07/13/2021 9:58 AM ? ?For on call review [email or web address].  ?

## 2021-07-13 NOTE — Plan of Care (Signed)

## 2021-07-13 NOTE — Progress Notes (Signed)
Cross Cover ?[name] is a 56 year old male with bilateral lower extremity lymphadenopathy, obesity, chronic bilateral lower extremity venous stasis, history of atrial flutter, on anticoagulation, history of Streptococcus group B bacteremia, who,  upon arriving to the hospital, he was found to have  ulceration in left dorsal foot with associated cellulitis.  Patient was placed on antibiotics with vancomycin and cefepime.  S/P   I&D performed on 4/7. ?Has no pain management orders.  Currently nurse reports pain states pain is 7/10. Dose of tramadol ordered for moderate pain ? ?

## 2021-07-14 DIAGNOSIS — D509 Iron deficiency anemia, unspecified: Secondary | ICD-10-CM

## 2021-07-14 LAB — CULTURE, BLOOD (ROUTINE X 2)
Culture: NO GROWTH
Culture: NO GROWTH
Special Requests: ADEQUATE

## 2021-07-14 LAB — VANCOMYCIN, TROUGH: Vancomycin Tr: 16 ug/mL (ref 15–20)

## 2021-07-14 MED ORDER — ENOXAPARIN SODIUM 100 MG/ML IJ SOSY
90.0000 mg | PREFILLED_SYRINGE | INTRAMUSCULAR | Status: DC
  Administered 2021-07-14: 90 mg via SUBCUTANEOUS
  Filled 2021-07-14 (×2): qty 0.9

## 2021-07-14 MED ORDER — AMOXICILLIN-POT CLAVULANATE 875-125 MG PO TABS
1.0000 | ORAL_TABLET | Freq: Two times a day (BID) | ORAL | Status: DC
  Administered 2021-07-14 – 2021-07-15 (×4): 1 via ORAL
  Filled 2021-07-14 (×4): qty 1

## 2021-07-14 NOTE — Assessment & Plan Note (Signed)
Continue iron treatment orally, hemoglobin stable ?

## 2021-07-14 NOTE — Consult Note (Addendum)
WOC Nurse Consult Note: ?Reason for Consult: NPWT dressing change to right anterior (dorsal) foot full thickness wound ?Wound type: infection ?Pressure Injury POA: N/A ?Measurement:4cm x 4.5cm x 0.8cm ?Wound bed: Beefy red, moist ?Drainage (amount, consistency, odor) Small serosanguinous ?Periwound: dry flaking skin on the RLE consistent with chronic lymphedema ?Dressing procedure/placement/frequency: Patient premedicated with Tylenol prior to wound care. NPWT dressing removed using medical adhesive remover spray. Patient tolerated procedure well. Skin barrier ring placed around periphery of wound to enhance seal and fill irregularities in surface near digits. One piece black foam used to fill dead space, this is covered with drape. Additional drape used to cover anterior foot to ankle for bridge. Foam placed on top of drape and secured with additional drape. T.R.A.C. pad affixed to dressing and dressing attached to continuous negative pressure. An immediate seal is achieved. Compression therapy applied after padding tubing to prevent medical device related pressure injury. (Profore Light, no 3rd layer, as discussed with Dr. Excell Seltzer over weekend.) With next dressing change, compression can be with dry boot, i.e., Kerlix wrapped from toe to knee topped with ACE wrap applied in a similar manner. ?LLE wound care performed and light compression therapy applied. As noted above. Heels floated. Patient expressed satisfaction and appreciation for wound care.  ? ?Next NPWT dressing change to right dorsal foot is due on Wednesday, 07/16/2021. I will perform if patient is still in house. Supplies in room.  ? ?If patient discharges before Wednesday, and if [employer]/71M NPWT pump is delivered to room, please attach to home unit prior to discharge. If discharging with another brand of NPWT, dressing is to be removed and a NS dressing placed into the wound until the [facility] can visit and perform wound care with the brand of NPWT  available. ? ?. ?Thanks, ?Ladona Mow, MSN, RN, GNP, CWOCN, CWON-AP, FAAN  ?Pager# [phone]  ? ? ? ?  ?

## 2021-07-14 NOTE — Plan of Care (Signed)

## 2021-07-14 NOTE — TOC Initial Note (Signed)
Transition of Care (TOC) - Initial/Assessment Note  ? ? ?Patient Details  ?Name: Alfred [name] ?MRN: [id number] ?Date of Birth: [birth year] ? ?Transition of Care (TOC) CM/SW Contact:    ?Suprena Travaglini A Chasey Dull, LCSW ?Phone Number: ?07/14/2021, 11:03 AM ? ?Clinical Narrative:    CSW spoke with pt regarding wound vac and charity hh. Pt will not dc today. Application for charity wound vac completed, signed by pt, and emailed to [place] with [employer]. CSW will check with [facility] agency on day of dc. Anticipated dc tomorrow.              ? ? ?Expected Discharge Plan: Home w Home Health Services ?Barriers to Discharge: Continued Medical Work up ? ? ?Patient Goals and CMS Choice ?Patient states their goals for this hospitalization and ongoing recovery are:: To go home ?  ?Choice offered to / list presented to : Patient ? ?Expected Discharge Plan and Services ?Expected Discharge Plan: Home w Home Health Services ?In-house Referral: Clinical Social Work ?  ?Post Acute Care Choice: Home Health ?Living arrangements for the past 2 months: Single Family Home ?                ?DME Arranged: Negative pressure wound device ?DME Agency: [employer] ?Date DME Agency Contacted: 07/14/21 ?Time DME Agency Contacted: 1100 ?Representative spoke with at DME Agency: [place] ?HH Arranged: PT, OT ?HH Agency: [facility] ?Date HH Agency Contacted: 07/14/21 ?Time HH Agency Contacted: 1101 ?  ? ?Prior Living Arrangements/Services ?Living arrangements for the past 2 months: Single Family Home ?Lives with:: Relatives (cousin) ?Patient language and need for interpreter reviewed:: Yes ?Do you feel safe going back to the place where you live?: Yes      ?Need for Family Participation in Patient Care: Yes (Comment) ?Care giver support system in place?: Yes (comment) ?  ?Criminal Activity/Legal Involvement Pertinent to Current Situation/Hospitalization: No - Comment as needed ? ?Activities of Daily Living ?Home Assistive Devices/Equipment: Dan Humphreys (specify  type) (does not use) ?ADL Screening (condition at time of admission) ?Patient's cognitive ability adequate to safely complete daily activities?: Yes ?Is the patient deaf or have difficulty hearing?: No ?Does the patient have difficulty seeing, even when wearing glasses/contacts?: No ?Does the patient have difficulty concentrating, remembering, or making decisions?: No ?Patient able to express need for assistance with ADLs?: Yes ?Does the patient have difficulty dressing or bathing?: Yes ?Independently performs ADLs?: Yes (appropriate for developmental age) ?Does the patient have difficulty walking or climbing stairs?: No ?Weakness of Legs: None ?Weakness of Arms/Hands: None ? ?Permission Sought/Granted ?Permission sought to share information with : Family Supports ?Permission granted to share information with : Yes, Verbal Permission Granted ? Share Information with NAME: Raekuon ?   ? Permission granted to share info w Relationship: son ? Permission granted to share info w Contact Information: [phone] ? ?Emotional Assessment ?Appearance:: Appears stated age ?Attitude/Demeanor/Rapport: Engaged ?Affect (typically observed): Accepting ?Orientation: : Oriented to Self, Oriented to Place, Oriented to  Time, Oriented to Situation ?Alcohol / Substance Use: Not Applicable ?Psych Involvement: No (comment) ? ?Admission diagnosis:  Foot ulcer (HCC) [L97.509] ?Ulcer of right foot, unspecified ulcer stage (HCC) [L97.519] ?Patient Active Problem List  ? Diagnosis Date Noted  ? Iron deficiency anemia 07/14/2021  ? Cellulitis and abscess of toe of right foot 07/12/2021  ? Lymphedema of both lower extremities 07/10/2021  ? Chronic diastolic CHF (congestive heart failure) (HCC) 07/10/2021  ? Foot ulcer (HCC) 07/09/2021  ? Heart failure with preserved  ejection fraction (HCC)   ? Paroxysmal atrial flutter (HCC)   ? Paroxysmal atrial fibrillation (HCC)   ? Acute on chronic heart failure with preserved ejection fraction (HFpEF)  (HCC)   ? Lower extremity edema 11/27/2019  ? Morbid obesity (HCC) 11/27/2019  ? Tobacco abuse 11/27/2019  ? Bacteremia 11/25/2019  ? AKI (acute kidney injury) (HCC) 11/24/2019  ? Streptococcal sepsis, unspecified (HCC) 11/24/2019  ? ?PCP:  Patient, No Pcp Per (Inactive) ?Pharmacy:   ?[facility] ?[address] ?[place] [address] ?Phone: [phone] Fax: [phone] ? ? ? ? ?Social Determinants of Health (SDOH) Interventions ?  ? ?Readmission Risk Interventions ?   ? View : No data to display.  ?  ?  ?  ? ? ? ?

## 2021-07-14 NOTE — Progress Notes (Signed)
?  Progress Note ? ? ?Patient: Alfred [name] ERX:[id number] DOB: January 14, 1966 DOA: 07/09/2021     5 ?DOS: the patient was seen and examined on 07/14/2021 ?  ?Brief hospital course: ?Mr. Bilal Manzer is a 56 year old male with bilateral lower extremity lymphadenopathy, obesity, chronic bilateral lower extremity venous stasis, history of atrial flutter, on anticoagulation, history of Streptococcus group B bacteremia, who presents emergency department via EMS for chief concerns of bilateral lower extremity swelling. ? ?Upon arriving the hospital, he was found to have peak ulceration in left dorsal foot.  With associated cellulitis.  Patient was placed on antibiotics with vancomycin and cefepime. ? ?MRI did not show osteomyelitis, but showed abscess.  I&D performed on 4/7. ? ?Assessment and Plan: ?* Foot ulcer (HCC) ?Status post I&D for abscess. ?Discussed with Dr. Excell Seltzer, patient eventually will need skin graft after treatment of infection. ?Discussed with case management, will take a day to set up home care and obtain care of wound VAC.  We will keep patient for today and discharge tomorrow. ? ?Iron deficiency anemia ?Continue iron treatment orally, hemoglobin stable ? ?Cellulitis and abscess of toe of right foot ?Patient is status post I&D, has a wound VAC.  Final culture results still pending, preliminary study showed Staph aureus, mixed anaerobic bacteria.  We will continue vancomycin, discontinue cefepime, add Augmentin. ? ?Chronic diastolic CHF (congestive heart failure) (HCC) ?Most recent echocardiogram was on 12/2019, poor quality due to morbid obesity.  Ejection fraction was estimated to be 55%.   ?No shortness of breath, no exacerbation. ? ?Lymphedema of both lower extremities ?Patient has a chronic lymphedema in bilateral lower extremities, this is a probably the source of ulceration.  ABI he has mild peripheral vessel disease.  Patient has been evaluated by vascular surgery, currently has no plan for  intervention. ? ?Paroxysmal atrial flutter (HCC) ?Still in sinus rhythm. ? ?Morbid obesity (HCC) ?Diet and exercise advised ? ? ? ? ?  ? ?Subjective:  ?Patient doing better today, no significant leg pain.  No nausea vomiting fever chills.  No shortness of breath ? ?Physical Exam: ?Vitals:  ? 07/13/21 2017 07/13/21 2017 07/14/21 0417 07/14/21 0700  ?BP:  (!) 142/74 (!) 145/79 (!) 141/81  ?Pulse:  69 69 70  ?Resp:  16 20 18   ?Temp: 98.6 ?F (37 ?C)  98.4 ?F (36.9 ?C) 98 ?F (36.7 ?C)  ?TempSrc: Oral  Oral Oral  ?SpO2:  99% 99% 98%  ?Weight:      ?Height:      ? ?General exam: Appears calm and comfortable  ?Respiratory system: Clear to auscultation. Respiratory effort normal. ?Cardiovascular system: S1 & S2 heard, RRR. No JVD, murmurs, rubs, gallops or clicks.  ?Gastrointestinal system: Abdomen is nondistended, soft and nontender. No organomegaly or masses felt. Normal bowel sounds heard. ?Central nervous system: Alert and oriented. No focal neurological deficits. ?Extremities: Bilateral lower extremity chronic lymphedema ?Skin: Foot also noted ?Psychiatry: Judgement and insight appear normal. Mood & affect appropriate.  ? ?Data Reviewed: ? ?Reviewed the lab results, reviewed culture results ? ?Family Communication:  ? ?Disposition: ?Status is: Inpatient ?Remains inpatient appropriate because: Unsafe discharge. ? Planned Discharge Destination: Home with Home Health ? ? ? ?Time spent: 28 minutes ? ?Author: ? , MD ?07/14/2021 10:00 AM ? ?For on call review [email or web address].  ?

## 2021-07-14 NOTE — Progress Notes (Signed)
Pharmacy Antibiotic Note ? ?Alfred [name] is a 56 y.o. male admitted on 07/09/2021 with foot abscess and wound infection. MRI 4/6 revealed subq abscess along medial midfoot. Pharmacy has been consulted for Cefepime and Vancomycin dosing. ? ?Today, 07/14/2021 ?Day #5 antibiotics Vancomycin + cefepime to Amox/clav PO ?Afebrile ?Renal: SCr stable 0.65 ?WBC WNL ?Status post I&D by podiatry on 4/7 ?4/7 foot cx: S. Aureus with mixed anaerobes.  Susc pending for S. aureus ?Vancomycin trough 4/10 at 0915 (on vancomycin 2gm IV q12h, prev dose given 4/9 at 2134 ? ?Plan: ?Continue vancomycin 2gm IV q12h ?Vancomycin trough checked this morning to ensure level ok following receiving 2gm IV q8h dosing last week.  ?Plan per notes is discharge anticipated tomorrow.  ?Monitor clinical picture, renal function, and vancomycin levels at steady state  ?F/U C&S, abx deescalation / LOT ? ? ?Height: 6\' 7"  (200.7 cm) ?Weight: (!) 194 kg (427 lb 11.1 oz) ?IBW/kg (Calculated) : 93.7 ? ?Temp (24hrs), Avg:98.3 ?F (36.8 ?C), Min:98 ?F (36.7 ?C), Max:98.6 ?F (37 ?C) ? ?Recent Labs  ?Lab 07/09/21 ?2139 07/10/21 ?09/09/21 07/11/21 ?0447 07/12/21 ?0443 07/13/21 ?0400 07/14/21 ?0915  ?WBC 6.5 7.3  --  7.5  --   --   ?CREATININE 0.75 0.66 0.70 0.63 0.65  --   ?VANCOTROUGH  --   --   --   --   --  16  ? ?  ?Estimated Creatinine Clearance: 199.8 mL/min (by C-G formula based on SCr of 0.65 mg/dL).   ? ?No Known Allergies ? ?Antimicrobials this admission: ?4/05 Vancomycin >>  ?4/06 Cefepime >> 4/10 ?4/10 amox/clav >> ? ?Microbiology results: ?4/5 BCx: NG x 2 days  ?4/5 MRSA PCR: Positive  ?4/7 Wound: S. Aureus + anaerobes ? ?Thank you for allowing pharmacy to be a part of this patient?s care. ? ?6/7, PharmD, BCPS, BCIDP ?Work Cell: [phone] ?07/14/2021 11:06 AM ? ? ? ? ?

## 2021-07-15 ENCOUNTER — Other Ambulatory Visit: Payer: Self-pay

## 2021-07-15 MED ORDER — AMOXICILLIN-POT CLAVULANATE 875-125 MG PO TABS
1.0000 | ORAL_TABLET | Freq: Two times a day (BID) | ORAL | 0 refills | Status: AC
  Filled 2021-07-15: qty 14, 7d supply, fill #0

## 2021-07-15 MED ORDER — FERROUS SULFATE 325 (65 FE) MG PO TABS
325.0000 mg | ORAL_TABLET | Freq: Every day | ORAL | 0 refills | Status: AC
  Filled 2021-07-15: qty 30, 30d supply, fill #0

## 2021-07-15 MED ORDER — DOXYCYCLINE MONOHYDRATE 100 MG PO CAPS
100.0000 mg | ORAL_CAPSULE | Freq: Two times a day (BID) | ORAL | 0 refills | Status: AC
  Filled 2021-07-15: qty 14, 7d supply, fill #0

## 2021-07-15 NOTE — Discharge Summary (Signed)
?Physician Discharge Summary ?  ?Patient: Alfred [name] MRN: [id number] DOB: 1966-02-06  ?Admit date:     07/09/2021  ?Discharge date: 07/15/21  ?Discharge Physician: Marrion Coy  ? ?PCP: Patient, No Pcp Per (Inactive)  ? ?Recommendations at discharge:  ? ?Follow-up with Dr. Excell Seltzer in 2 weeks. ?Follow-up with PCP in 1 week ? ?Discharge Diagnoses: ?Principal Problem: ?  Foot ulcer (HCC) ?Active Problems: ?  Morbid obesity (HCC) ?  Paroxysmal atrial flutter (HCC) ?  Lymphedema of both lower extremities ?  Chronic diastolic CHF (congestive heart failure) (HCC) ?  Cellulitis and abscess of toe of right foot ?  Iron deficiency anemia ? ?Resolved Problems: ?  * No resolved hospital problems. * ? ?Hospital Course: ?[name] is a 56 year old male with bilateral lower extremity lymphadenopathy, obesity, chronic bilateral lower extremity venous stasis, history of atrial flutter, on anticoagulation, history of Streptococcus group B bacteremia, who presents emergency department via EMS for chief concerns of bilateral lower extremity swelling. ? ?Upon arriving the hospital, he was found to have peak ulceration in left dorsal foot.  With associated cellulitis.  Patient was placed on antibiotics with vancomycin and cefepime. ? ?MRI did not show osteomyelitis, but showed abscess.  I&D performed on 4/7. ? ?Assessment and Plan: ?* Foot ulcer (HCC) ?Status post I&D for abscess. ?Discussed with Dr. Excell Seltzer, patient eventually will need skin graft after treatment of infection. ?.  Patient was kept in the hospital for additional day to set up home care and wound care as outpatient. ?Patient will follow up with Dr. Excell Seltzer as outpatient in the future for possible skin graft. ? ?Iron deficiency anemia ?Continue iron treatment orally, hemoglobin stable ? ?Cellulitis and abscess of toe of right foot ?Patient is status post I&D, has a wound VAC.   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  MRSA susceptible to  doxycycline.  We will continue doxycycline and Augmentin for 7 more days.  Patient will follow up with home nurse to take care of the wound and wound VAC.  Follow-up with Dr. Excell Seltzer as outpatient ? ?Chronic diastolic CHF (congestive heart failure) (HCC) ?Most recent echocardiogram was on 12/2019, poor quality due to morbid obesity.  Ejection fraction was estimated to be 55%.   ?No shortness of breath, no exacerbation. ? ?Lymphedema of both lower extremities ?Patient has a chronic lymphedema in bilateral lower extremities, this is a probably the source of ulceration.  ABI he has mild peripheral vessel disease.  Patient has been evaluated by vascular surgery, currently has no plan for intervention. ? ?Paroxysmal atrial flutter (HCC) ?Still in sinus rhythm. ? ?Morbid obesity (HCC) ?Diet and exercise advised ? ? ?Spoke With case management, patient has home care set up already, wound VAC will be delivered today. ? ?  ? ? ?Consultants: Podiatry ?Procedures performed: I&D  ?Disposition: Home health ?Diet recommendation:  ?Cardiac diet ?DISCHARGE MEDICATION: ?Allergies as of 07/15/2021   ?No Known Allergies ?  ? ?  ?Medication List  ?  ? ?STOP taking these medications   ? ?apixaban 5 MG Tabs tablet ?Commonly known as: ELIQUIS ?  ?potassium chloride 10 MEQ tablet ?Commonly known as: KLOR-CON M ?  ?torsemide 20 MG tablet ?Commonly known as: Demadex ?  ? ?  ? ?TAKE these medications   ? ?acetaminophen 325 MG tablet ?Commonly known as: TYLENOL ?Take 2 tablets (650 mg total) by mouth every 6 (six) hours as needed for mild pain. ?  ?amiodarone 200 MG tablet ?Commonly known  as: PACERONE ?Take 1 tablet (200 mg total) by mouth daily. ?What changed: Another medication with the same name was removed. Continue taking this medication, and follow the directions you see here. ?  ?amoxicillin-clavulanate 875-125 MG tablet ?Commonly known as: AUGMENTIN ?Take 1 tablet by mouth every 12 (twelve) hours for 7 days. ?  ?doxycycline 100 MG  capsule ?Commonly known as: MONODOX ?Take 1 capsule (100 mg total) by mouth 2 (two) times daily for 7 days. ?  ?ferrous sulfate 325 (65 FE) MG tablet ?Take 1 tablet (325 mg total) by mouth once daily with breakfast. ?Start taking on: July 16, 2021 ?  ? ?  ? ? Follow-up Information   ? ? Georgiana SpinnerBrown, Fallon E, NP Follow up in 2 week(s).   ?Specialty: Vascular Surgery ?Why: with ABIs ?Contact information: ?[address] ?[place] [address] ?[phone] ? ? ?  ?  ? ? [facility]. Schedule an appointment as soon as possible for a visit in 1 week(s).   ?Specialty: Wound Care ?Why: For wound re-check ?Contact information: ?[address] ?[phone] ?[phone] ? ?  ?  ? ? Rosetta PosnerBaker, Andrew, DPM. Schedule an appointment as soon as possible for a visit in 3 week(s).   ?Specialty: Podiatry ?Why: For suture removal, For wound re-check ?Contact information: ?[address] ?[place] [address] ?[phone] ? ? ?  ?  ? ? Iran OuchArida, Muhammad A, MD Follow up in 1 week(s).   ?Specialty: Cardiology ?Contact information: ?[address] ?[address] ?[place] [address] ?[phone] ? ? ?  ?  ? ?  ?  ? ?  ? ?Discharge Exam: ?Filed Weights  ? 07/09/21 2132 07/11/21 1335  ?Weight: (!) 194 kg (!) 194 kg  ? ?General exam: Appears calm and comfortable.  Morbid obesity ?Respiratory system: Clear to auscultation. Respiratory effort normal. ?Cardiovascular system: S1 & S2 heard, RRR. No JVD, murmurs, rubs, gallops or clicks. No pedal edema. ?Gastrointestinal system: Abdomen is nondistended, soft and nontender. No organomegaly or masses felt. Normal bowel sounds heard. ?Central nervous system: Alert and oriented. No focal neurological deficits. ?Extremities: Chronic lymphedema in lower extremity and foot ulcer ?Skin: No rashes, lesions or ulcers ?Psychiatry: Judgement and insight appear normal. Mood & affect appropriate.  ? ? ?Condition at discharge: good ? ?The results of significant  diagnostics from this hospitalization (including imaging, microbiology, ancillary and laboratory) are listed below for reference.  ? ?Imaging Studies: ?MR FOOT RIGHT W WO CONTRAST ? ?Result Date: 07/10/2021 ?CLINICAL DATA:  Foot swelling, nondiabetic, osteomyelitis suspected deep ulceration of dorsal surface of right foot with hole approximately 4 cm wide and 2 cm deep EXAM: MRI OF THE RIGHT FOREFOOT WITHOUT AND WITH CONTRAST TECHNIQUE: Multiplanar, multisequence MR imaging of the right forefoot was performed before and after the administration of intravenous contrast. CONTRAST:  10mL GADAVIST GADOBUTROL 1 MMOL/ML IV SOLN COMPARISON:  Right foot radiograph 07/09/2021 FINDINGS: Bones/Joint/Cartilage There is no evidence of acute fracture or dislocation. Preserved T1 marrow signal throughout. There is no frank bony destruction. There is mild great toe MTP and IP joint osteoarthritis. Ligaments The Lisfranc ligament appears intact, poorly visualized. Muscles and Tendons Diffuse intramuscular edema muscle atrophy in the forefoot as is commonly seen in diabetics. No acute tendon tear. Soft tissues There is a dorsal forefoot wound. Diffuse soft tissue swelling. There is a rim enhancing fluid collection along the medial midfoot measuring 2.2 x 0.9 x 2.5 cm (axial 2 postcontrast image 3, coronal postcontrast image  15). IMPRESSION: Subcutaneous abscess along the medial midfoot measuring 2.2 x 0.9 x 2.5 cm. Diffuse soft tissue swelling of the foot with dorsal forefoot wound. No evidence of osteomyelitis. Electronically Signed   By: Caprice Renshaw M.D.   On: 07/10/2021 07:32  ? ?US ARTERIAL ABI (SCREENING LOWER EXTREMITY) ? ?Result Date: 07/10/2021 ?CLINICAL DATA:  Nonhealing right foot wound. History hypertension and smoking. EXAM: NONINVASIVE PHYSIOLOGIC VASCULAR STUDY OF BILATERAL LOWER EXTREMITIES TECHNIQUE: Evaluation of both lower extremities were performed at rest, including calculation of ankle-brachial indices with single  level Doppler, pressure and pulse volume recording. COMPARISON:  None. FINDINGS: Right ABI:  0.88 Left ABI:  0.86 Right Lower Extremity: Posterior tibial waveform is monophasic in the dorsalis pedis waveform biphasic

## 2021-07-15 NOTE — TOC Progression Note (Signed)
Transition of Care (TOC) - Progression Note  ? ? ?Patient Details  ?Name: Alfred [name] ?MRN: [id number] ?Date of Birth: 04-04-1966 ? ?Transition of Care (TOC) CM/SW Contact  ?Marlowe Sax, RN ?Phone Number: ?07/15/2021, 12:54 PM ? ?Clinical Narrative:   Patient is open with [facility] for [facility] ? ? ? ?Expected Discharge Plan: Home w Home Health Services ?Barriers to Discharge: Continued Medical Work up ? ?Expected Discharge Plan and Services ?Expected Discharge Plan: Home w Home Health Services ?In-house Referral: Clinical Social Work ?  ?Post Acute Care Choice: Home Health ?Living arrangements for the past 2 months: Single Family Home ?                ?DME Arranged: Negative pressure wound device ?DME Agency: [employer] ?Date DME Agency Contacted: 07/14/21 ?Time DME Agency Contacted: 1100 ?Representative spoke with at DME Agency: [place] ?HH Arranged: PT, OT ?HH Agency: [facility] ?Date HH Agency Contacted: 07/14/21 ?Time HH Agency Contacted: 1101 ?  ? ? ?Social Determinants of Health (SDOH) Interventions ?  ? ?Readmission Risk Interventions ?   ? View : No data to display.  ?  ?  ?  ? ? ?

## 2021-07-15 NOTE — TOC Progression Note (Signed)
Transition of Care (TOC) - Progression Note  ? ? ?Patient Details  ?Name: Alfred [name] ?MRN: [id number] ?Date of Birth: 1966-04-01 ? ?Transition of Care (TOC) CM/SW Contact  ?Marlowe Sax, RN ?Phone Number: ?07/15/2021, 10:00 AM ? ?Clinical Narrative:    ?Emailed the Medicaid forms to [place] at [place], Asked about when the Wound vac will be delivered, Reached out to [place] and notified Kandee Keen of the need for [facility] ? ? ?Expected Discharge Plan: Home w Home Health Services ?Barriers to Discharge: Continued Medical Work up ? ?Expected Discharge Plan and Services ?Expected Discharge Plan: Home w Home Health Services ?In-house Referral: Clinical Social Work ?  ?Post Acute Care Choice: Home Health ?Living arrangements for the past 2 months: Single Family Home ?                ?DME Arranged: Negative pressure wound device ?DME Agency: [employer] ?Date DME Agency Contacted: 07/14/21 ?Time DME Agency Contacted: 1100 ?Representative spoke with at DME Agency: French Ana ?HH Arranged: PT, OT ?HH Agency: [facility] ?Date HH Agency Contacted: 07/14/21 ?Time HH Agency Contacted: 1101 ?  ? ? ?Social Determinants of Health (SDOH) Interventions ?  ? ?Readmission Risk Interventions ?   ? View : No data to display.  ?  ?  ?  ? ? ?

## 2021-07-15 NOTE — Plan of Care (Signed)

## 2021-07-15 NOTE — Progress Notes (Addendum)
1235 ?Infectious disease specialist stated pt has MRSA in wounds and will need to be placed on isolation. Pt is suppose to be d/c today ? ?1523 ?Wound vac delivered to pt room. Awaiting d/c orders and son will pick pt up. ? ?1808 ?Dc orders placed. Will review avs with pt and removed IV. ? ?1824 ?Pt d/c IV  removed. Home Wound vac placed. Pt getting dressed will call when son has arrived ?

## 2021-07-16 LAB — AEROBIC/ANAEROBIC CULTURE W GRAM STAIN (SURGICAL/DEEP WOUND): Gram Stain: NONE SEEN

## 2022-10-29 IMAGING — MR MR FOOT*R* WO/W CM
11 series · 40 of 40 positions shown · IV contrast (10ml Gadavist)
Comparison: Right foot radiograph 07/09/2021

CLINICAL DATA: Foot swelling, nondiabetic, osteomyelitis suspected
deep ulceration of dorsal surface of right foot with hole
approximately 4 cm wide and 2 cm deep

EXAM:
MRI OF THE RIGHT FOREFOOT WITHOUT AND WITH CONTRAST
TECHNIQUE: Multiplanar, multisequence MR imaging of the right forefoot was
performed before and after the administration of intravenous
contrast.
CONTRAST:  10mL GADAVIST GADOBUTROL 1 MMOL/ML IV SOLN

[Series 7: T1 · oblique · right · 3.0mm · 0.78mm/px · 4 of 55 slices shown (1 of 2)]
[im 1/55]
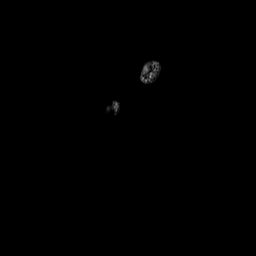
[im 19/55]
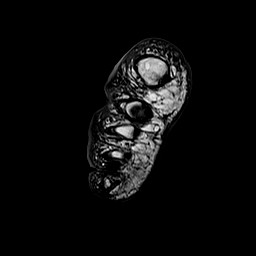
[im 37/55]
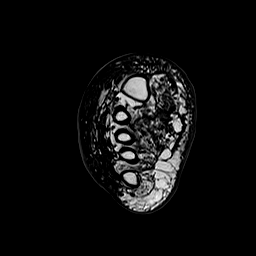
[im 55/55]
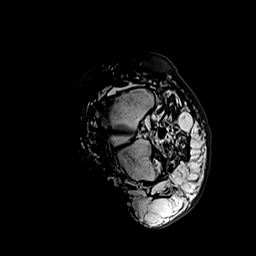

[Series 8: T2 · oblique · right · 3.0mm · 0.83mm/px · 5 of 55 slices shown (1 of 4)]
[im 1/55]
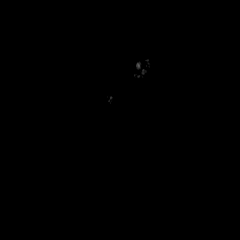
[im 14/55]
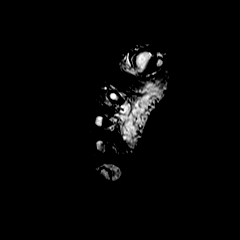
[im 28/55]
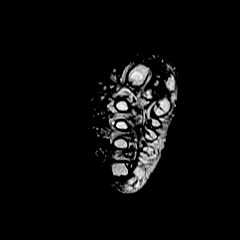
[im 41/55]
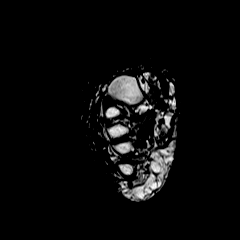
[im 55/55]
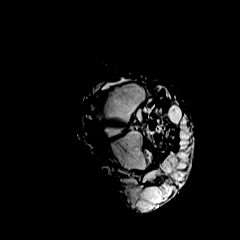

[Series 9: T2 · oblique · right · 3.0mm · 0.83mm/px · 5 of 55 slices shown (2 of 4)]
[im 1/55]
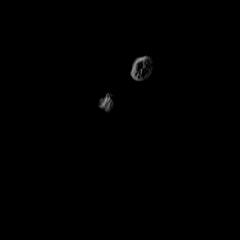
[im 14/55]
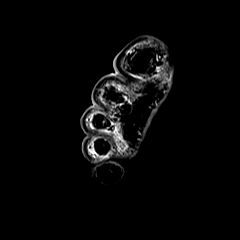
[im 28/55]
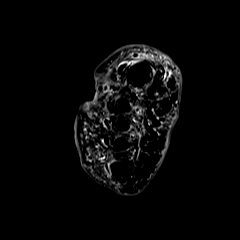
[im 41/55]
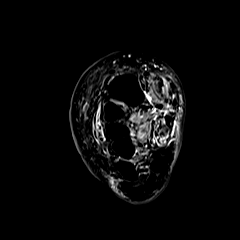
[im 55/55]
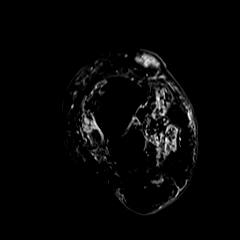

[Series 10: T1 fat-sat · oblique · non-contrast · right · 3.0mm · 0.78mm/px · 5 of 55 slices shown (1 of 3)]
[im 1/55]
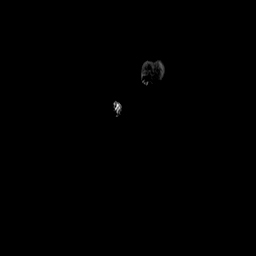
[im 14/55]
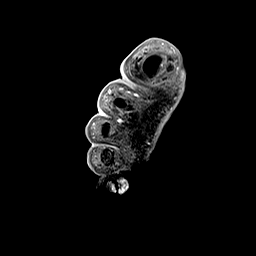
[im 28/55]
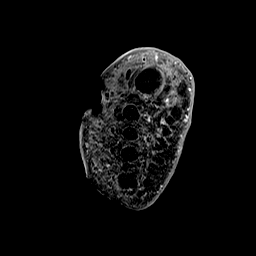
[im 41/55]
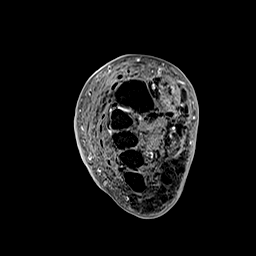
[im 55/55]
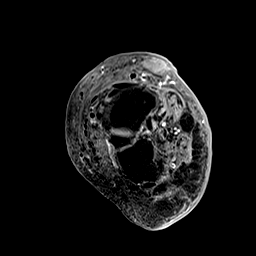

[Series 11: T2 · oblique · right · 3.0mm · 1.17mm/px · 2 of 26 slices shown (3 of 4)]
[im 1/26]
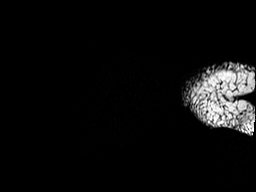
[im 26/26]
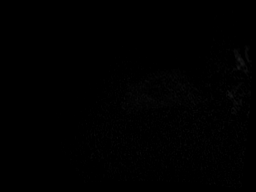

[Series 12: T2 · oblique · right · 3.0mm · 1.17mm/px · 2 of 25 slices shown (4 of 4)]
[im 1/25]
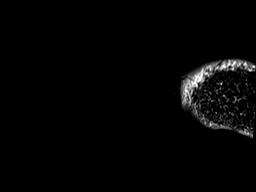
[im 25/25]
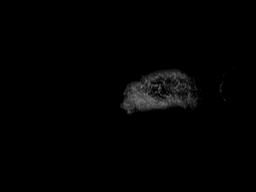

[Series 13: T1 · oblique · right · 3.0mm · 1.17mm/px · 2 of 27 slices shown (2 of 2)]
[im 1/27]
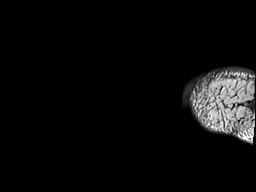
[im 27/27]
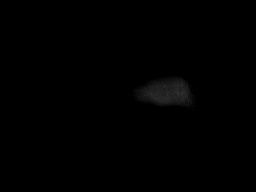

[Series 14: STIR · coronal · right · 3.0mm · 0.78mm/px · 4 of 44 slices shown]
[im 1/44]
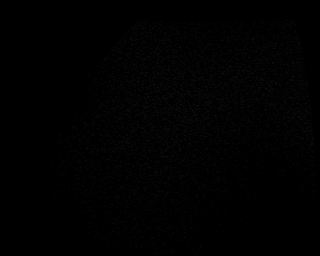
[im 15/44]
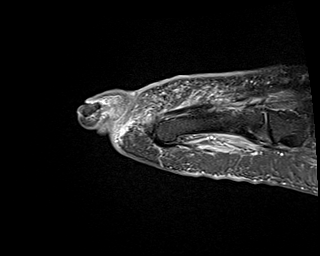
[im 29/44]
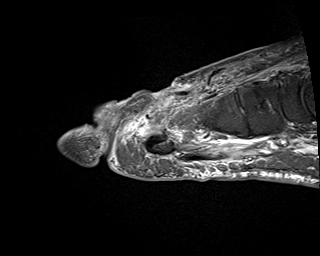
[im 44/44]
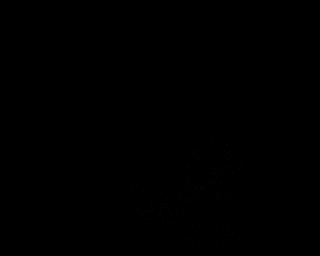

[Series 15: T1 fat-sat post-contrast · oblique · right · 3.0mm · 0.78mm/px · 5 of 55 slices shown]
[im 1/55]
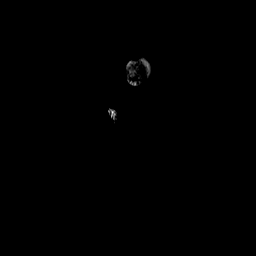
[im 14/55]
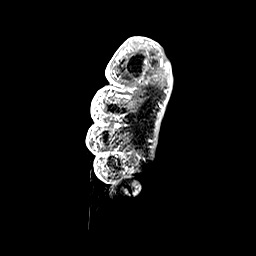
[im 28/55]
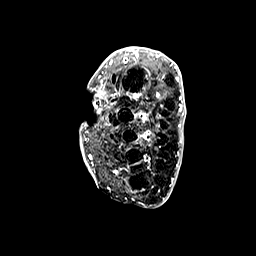
[im 41/55]
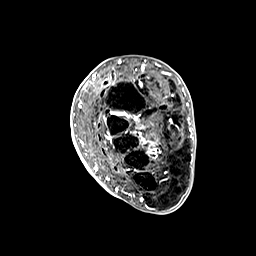
[im 55/55]
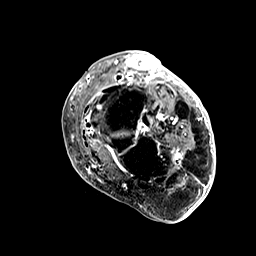

[Series 16: T1 fat-sat · coronal · right · 3.0mm · 0.78mm/px · 4 of 44 slices shown (2 of 3)]
[im 1/44]
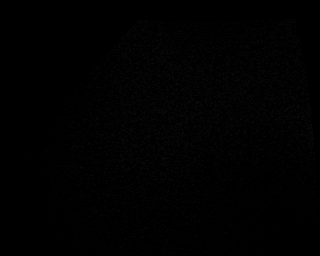
[im 15/44]
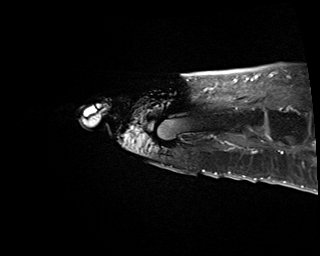
[im 29/44]
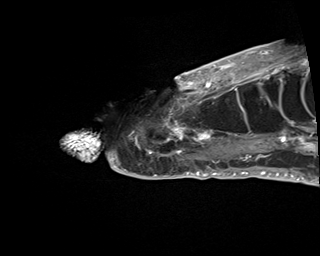
[im 44/44]
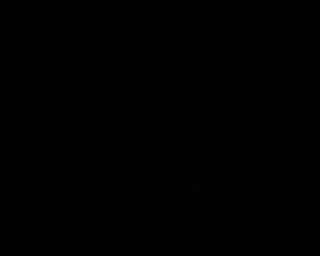

[Series 17: T1 fat-sat · oblique · right · 3.0mm · 0.94mm/px · 2 of 25 slices shown (3 of 3)]
[im 1/25]
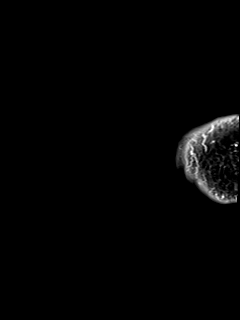
[im 25/25]
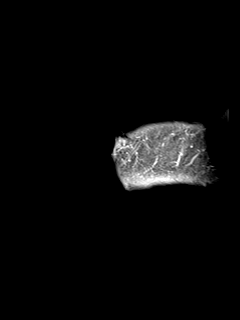

[40 of 40 positions shown; findings below may reference images not displayed]

FINDINGS: Bones/Joint/Cartilage

There is no evidence of acute fracture or dislocation. Preserved T1
marrow signal throughout. There is no frank bony destruction. There
is mild great toe MTP and IP joint osteoarthritis.

Ligaments

The Lisfranc ligament appears intact, poorly visualized.

Muscles and Tendons

Diffuse intramuscular edema muscle atrophy in the forefoot as is
commonly seen in diabetics. No acute tendon tear.

Soft tissues

There is a dorsal forefoot wound. Diffuse soft tissue swelling.
There is a rim enhancing fluid collection along the medial midfoot
measuring 2.2 x 0.9 x 2.5 cm (axial 2 postcontrast image 3, coronal
postcontrast image 15).
IMPRESSION: Subcutaneous abscess along the medial midfoot measuring 2.2 x 0.9 x
2.5 cm.

Diffuse soft tissue swelling of the foot with dorsal forefoot wound.

No evidence of osteomyelitis.
